# Patient Record
Sex: Male | Born: 1940 | Race: White | Hispanic: No | Marital: Married | Smoking: Former smoker
Health system: Southern US, Community
[De-identification: ages and names within clinical notes are randomized; demographics above are authoritative.]

## PROBLEM LIST (undated history)

## (undated) DIAGNOSIS — L309 Dermatitis, unspecified: Secondary | ICD-10-CM

## (undated) DIAGNOSIS — I4892 Unspecified atrial flutter: Principal | ICD-10-CM

## (undated) DIAGNOSIS — R9431 Abnormal electrocardiogram [ECG] [EKG]: Secondary | ICD-10-CM

## (undated) DIAGNOSIS — K219 Gastro-esophageal reflux disease without esophagitis: Secondary | ICD-10-CM

## (undated) DIAGNOSIS — I4891 Unspecified atrial fibrillation: Secondary | ICD-10-CM

## (undated) DIAGNOSIS — I428 Other cardiomyopathies: Secondary | ICD-10-CM

## (undated) DIAGNOSIS — I319 Disease of pericardium, unspecified: Secondary | ICD-10-CM

## (undated) DIAGNOSIS — B070 Plantar wart: Secondary | ICD-10-CM

## (undated) DIAGNOSIS — L989 Disorder of the skin and subcutaneous tissue, unspecified: Secondary | ICD-10-CM

## (undated) DIAGNOSIS — I10 Essential (primary) hypertension: Secondary | ICD-10-CM

## (undated) DIAGNOSIS — M1990 Unspecified osteoarthritis, unspecified site: Secondary | ICD-10-CM

## (undated) DIAGNOSIS — Z973 Presence of spectacles and contact lenses: Secondary | ICD-10-CM

## (undated) DIAGNOSIS — T7840XA Allergy, unspecified, initial encounter: Secondary | ICD-10-CM

## (undated) DIAGNOSIS — K5900 Constipation, unspecified: Secondary | ICD-10-CM

## (undated) HISTORY — DX: Constipation, unspecified: K59.00

## (undated) HISTORY — PX: COLONOSCOPY: SHX174

## (undated) HISTORY — DX: Dermatitis, unspecified: L30.9

## (undated) HISTORY — DX: Unspecified osteoarthritis, unspecified site: M19.90

## (undated) HISTORY — PX: COLONOSCOPY: SHX5424

## (undated) HISTORY — DX: Disorder of the skin and subcutaneous tissue, unspecified: L98.9

## (undated) HISTORY — DX: Allergy, unspecified, initial encounter: T78.40XA

## (undated) HISTORY — DX: Disease of pericardium, unspecified: I31.9

## (undated) HISTORY — DX: Plantar wart: B07.0

## (undated) HISTORY — DX: Gastro-esophageal reflux disease without esophagitis: K21.9

## (undated) HISTORY — PX: CALCANEUS HARDWARE REMOVAL: SHX1283

## (undated) HISTORY — DX: Essential (primary) hypertension: I10

## (undated) HISTORY — PX: TONSILLECTOMY: SUR1361

---

## 1991-09-24 HISTORY — PX: CALCANEAL OSTEOTOMY: SHX1281

## 1994-09-23 HISTORY — PX: FINGER AMPUTATION: SHX636

## 1999-04-02 ENCOUNTER — Emergency Department (HOSPITAL_COMMUNITY): Admission: EM | Admit: 1999-04-02 | Discharge: 1999-04-02 | Payer: Self-pay | Admitting: *Deleted

## 1999-04-05 ENCOUNTER — Encounter (HOSPITAL_COMMUNITY): Admission: RE | Admit: 1999-04-05 | Discharge: 1999-07-04 | Payer: Self-pay | Admitting: Emergency Medicine

## 2011-11-26 ENCOUNTER — Encounter: Payer: Self-pay | Admitting: Family Medicine

## 2011-11-26 ENCOUNTER — Ambulatory Visit (INDEPENDENT_AMBULATORY_CARE_PROVIDER_SITE_OTHER): Payer: Medicare Other | Admitting: Family Medicine

## 2011-11-26 DIAGNOSIS — Z0000 Encounter for general adult medical examination without abnormal findings: Secondary | ICD-10-CM

## 2011-11-26 DIAGNOSIS — Z136 Encounter for screening for cardiovascular disorders: Secondary | ICD-10-CM

## 2011-11-26 DIAGNOSIS — M25512 Pain in left shoulder: Secondary | ICD-10-CM

## 2011-11-26 DIAGNOSIS — M25519 Pain in unspecified shoulder: Secondary | ICD-10-CM

## 2011-11-26 DIAGNOSIS — M629 Disorder of muscle, unspecified: Secondary | ICD-10-CM

## 2011-11-26 DIAGNOSIS — IMO0001 Reserved for inherently not codable concepts without codable children: Secondary | ICD-10-CM | POA: Insufficient documentation

## 2011-11-26 DIAGNOSIS — Z125 Encounter for screening for malignant neoplasm of prostate: Secondary | ICD-10-CM

## 2011-11-26 DIAGNOSIS — R030 Elevated blood-pressure reading, without diagnosis of hypertension: Secondary | ICD-10-CM

## 2011-11-26 DIAGNOSIS — L989 Disorder of the skin and subcutaneous tissue, unspecified: Secondary | ICD-10-CM

## 2011-11-26 DIAGNOSIS — M19019 Primary osteoarthritis, unspecified shoulder: Secondary | ICD-10-CM

## 2011-11-26 LAB — HEPATIC FUNCTION PANEL
Albumin: 4.1 g/dL (ref 3.5–5.2)
Total Protein: 6.8 g/dL (ref 6.0–8.3)

## 2011-11-26 LAB — LIPID PANEL
Cholesterol: 185 mg/dL (ref 0–200)
HDL: 59.1 mg/dL (ref >39.00)
Triglycerides: 85 mg/dL (ref 0.0–149.0)
VLDL: 17 mg/dL (ref 0.0–40.0)

## 2011-11-26 LAB — RENAL FUNCTION PANEL
Albumin: 4.1 g/dL (ref 3.5–5.2)
BUN: 13 mg/dL (ref 6–23)
Chloride: 98 mEq/L (ref 96–112)
Creatinine, Ser: 0.8 mg/dL (ref 0.4–1.5)
GFR: 103.27 mL/min (ref >60.00)
Glucose, Bld: 88 mg/dL (ref 70–99)
Phosphorus: 2.7 mg/dL (ref 2.3–4.6)

## 2011-11-26 LAB — CBC
HCT: 47 % (ref 39.0–52.0)
MCHC: 33.6 g/dL (ref 30.0–36.0)
MCV: 90.3 fl (ref 78.0–100.0)
RDW: 13 % (ref 11.5–14.6)
WBC: 6.8 10*3/uL (ref 4.5–10.5)

## 2011-11-26 LAB — TSH: TSH: 1.34 u[IU]/mL (ref 0.35–5.50)

## 2011-11-26 NOTE — Progress Notes (Signed)
Patient ID: Francis Shaw, male   DOB: [birth year], 71 y.o.   MRN: [id number] Francis Shaw [id number] [birth year] 11/26/2011      Progress Note New Patient  Subjective  Chief Complaint  Chief Complaint  Patient presents with  . Establish Care    new patient    HPI  Patient is a 71 yo Caucasian male in for new patient appointment. Overall he is in good health and is not have to see a physician in a number of years. He only complains of shoulder pain. In the past he had right shoulder pain which was attributed to arthritis and after a steroid injection has not returned. He now has exactly the same pain in the left shoulder. Denies any injury or previous surgery. No recent illness, fevers, chills, chest pain, palpitations, shortness of breath, GI or GU complaints.  Past Medical History  Diagnosis Date  . Chicken pox as a child  . Measles as a child  . Mumps as a child  . Arthritis     shoulders  . 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 Skin lesion 11/26/2011  . Elevated BP 11/26/2011  . Preventative health care 11/26/2011    Past Surgical History  Procedure Date  . Right foot sugery 1993    shattered heel  . 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 Stroke Father   . Asthma Mother     low back pain    History   Social History  . Marital Status: Single    Spouse Name: N/A    Number of Children: N/A  . Years of Education: N/A   Occupational History  . Not on file.   Social History Main Topics  . Smoking status: Former Smoker -- 1.0 packs/day for 18 years    Types: Cigarettes    Quit date: 09/23/1977  . Smokeless tobacco: Never Used  . Alcohol Use: Yes     socially  . Drug Use: No  . Sexually Active: Yes   Other Topics Concern  . Not on file   Social History Narrative  . No narrative on file    No current outpatient prescriptions on file prior to visit.    No Known Allergies  Review of Systems  Review of  Systems  Constitutional: Negative for fever, chills and malaise/fatigue.  HENT: Negative for hearing loss, nosebleeds and congestion.   Eyes: Negative for discharge.  Respiratory: Negative for cough, sputum production, shortness of breath and wheezing.   Cardiovascular: Negative for chest pain, palpitations and leg swelling.  Gastrointestinal: Negative for heartburn, nausea, vomiting, abdominal pain, diarrhea, constipation and blood in stool.  Genitourinary: Negative for dysuria, urgency, frequency and hematuria.  Musculoskeletal: Positive for joint pain. Negative for myalgias, back pain and falls.       Left shoulder pain  Skin: Negative for rash.  Neurological: Negative for dizziness, tremors, sensory change, focal weakness, loss of consciousness, weakness and headaches.  Endo/Heme/Allergies: Negative for polydipsia. Does not bruise/bleed easily.  Psychiatric/Behavioral: Negative for depression and suicidal ideas. The patient is not nervous/anxious and does not have insomnia.     Objective  BP 149/86  Pulse 71  Temp(Src) 98.6 F (37 C) (Temporal)  Ht 5\' 11"  (1.803 m)  Wt 175 lb 1.9 oz (79.434 kg)  BMI 24.42 kg/m2  SpO2 97%  Physical Exam  Physical Exam  Constitutional: He is oriented to person, place, and time  and well-developed, well-nourished, and in no distress. No distress.  HENT:  Head: Normocephalic and atraumatic.  Eyes: Conjunctivae are normal.  Neck: Neck supple. No thyromegaly present.  Cardiovascular: Normal rate, regular rhythm and normal heart sounds.   No murmur heard. Pulmonary/Chest: Effort normal and breath sounds normal. No respiratory distress.  Abdominal: He exhibits no distension and no mass. There is no tenderness.  Musculoskeletal: He exhibits no edema.  Neurological: He is alert and oriented to person, place, and time.  Skin: Skin is warm. There is erythema.       Oval shaped lesion on neck behind left ear. Erythematous base and scaly, 1/2 x 1 cm     Psychiatric: Memory, affect and judgment normal.       Assessment & Plan  Skin lesion Scaly, pruritic lesion, erythematous base left side of neck, he already has an established relationship with Dr Yetta Barre of [facility], he agrees to call there and set up an appt he has not been in there in several years  Elevated BP Given handout on the DASH diet and encouraged to avoid sodium, maintain exercise. Reassess at next visit. Check fasting labs including a TSH  Arthritis of shoulder Previously had a flare in pain secondary to arthritis in right shoulder, he ultimately had a steroid injection with neurology and the pain has never returned. He is sent for xray of shoulder and referred back to neuro for further intervention. May continue Advil prn sparingly.  Preventative health care We will request old records from PMD and GI. Encouraged to accept the Tdap and the Zostavax at next visit. He will check with his insurance to make sure they cover zostavax.

## 2011-11-26 NOTE — Assessment & Plan Note (Signed)
Previously had a flare in pain secondary to arthritis in right shoulder, he ultimately had a steroid injection with neurology and the pain has never returned. He is sent for xray of shoulder and referred back to neuro for further intervention. May continue Advil prn sparingly.

## 2011-11-26 NOTE — Assessment & Plan Note (Signed)
We will request old records from PMD and GI. Encouraged to accept the Tdap and the Zostavax at next visit. He will check with his insurance to make sure they cover zostavax.

## 2011-11-26 NOTE — Patient Instructions (Signed)
Preventive Care for Adults, Male A healthy lifestyle and preventative care can promote health and wellness. Preventative health guidelines for men include the following key practices:  A routine yearly physical is a good way to check with your caregiver about your health and preventative screening. It is a chance to share any concerns and updates on your health, and to receive a thorough exam.   Visit your dentist for a routine exam and preventative care every 6 months. Brush your teeth twice a day and floss once a day. Good oral hygiene prevents tooth decay and gum disease.   The frequency of eye exams is based on your age, health, family medical history, use of contact lenses, and other factors. Follow your caregiver's recommendations for frequency of eye exams.   Eat a healthy diet. Foods like vegetables, fruits, whole grains, low-fat dairy products, and lean protein foods contain the nutrients you need without too many calories. Decrease your intake of foods high in solid fats, added sugars, and salt. Eat the right amount of calories for you.Get information about a proper diet from your caregiver, if necessary.   Regular physical exercise is one of the most important things you can do for your health. Most adults should get at least 150 minutes of moderate-intensity exercise (any activity that increases your heart rate and causes you to sweat) each week. In addition, most adults need muscle-strengthening exercises on 2 or more days a week.   Maintain a healthy weight. The body mass index (BMI) is a screening tool to identify possible weight problems. It provides an estimate of body fat based on height and weight. Your caregiver can help determine your BMI, and can help you achieve or maintain a healthy weight.For adults 20 years and older:   A BMI below 18.5 is considered underweight.   A BMI of 18.5 to 24.9 is normal.   A BMI of 25 to 29.9 is considered overweight.   A BMI of 30 and above  is considered obese.   Maintain normal blood lipids and cholesterol levels by exercising and minimizing your intake of saturated fat. Eat a balanced diet with plenty of fruit and vegetables. Blood tests for lipids and cholesterol should begin at age 20 and be repeated every 5 years. If your lipid or cholesterol levels are high, you are over 50, or you are a high risk for heart disease, you may need your cholesterol levels checked more frequently.Ongoing high lipid and cholesterol levels should be treated with medicines if diet and exercise are not effective.   If you smoke, find out from your caregiver how to quit. If you do not use tobacco, do not start.   If you choose to drink alcohol, do not exceed 2 drinks per day. One drink is considered to be 12 ounces (355 mL) of beer, 5 ounces (148 mL) of wine, or 1.5 ounces (44 mL) of liquor.   Avoid use of street drugs. Do not share needles with anyone. Ask for help if you need support or instructions about stopping the use of drugs.   High blood pressure causes heart disease and increases the risk of stroke. Your blood pressure should be checked at least every 1 to 2 years. Ongoing high blood pressure should be treated with medicines, if weight loss and exercise are not effective.   If you are 45 to 70 years old, ask your caregiver if you should take aspirin to prevent heart disease.   Diabetes screening involves taking a blood   sample to check your fasting blood sugar level. This should be done once every 3 years, after age 45, if you are within normal weight and without risk factors for diabetes. Testing should be considered at a younger age or be carried out more frequently if you are overweight and have at least 1 risk factor for diabetes.   Colorectal cancer can be detected and often prevented. Most routine colorectal cancer screening begins at the age of 50 and continues through age 75. However, your caregiver may recommend screening at an earlier  age if you have risk factors for colon cancer. On a yearly basis, your caregiver may provide home test kits to check for hidden blood in the stool. Use of a small camera at the end of a tube, to directly examine the colon (sigmoidoscopy or colonoscopy), can detect the earliest forms of colorectal cancer. Talk to your caregiver about this at age 50, when routine screening begins. Direct examination of the colon should be repeated every 5 to 10 years through age 75, unless early forms of pre-cancerous polyps or small growths are found.   Hepatitis C blood testing is recommended for all people born from 1945 through 1965 and any individual with known risks for hepatitis C.   Practice safe sex. Use condoms and avoid high-risk sexual practices to reduce the spread of sexually transmitted infections (STIs). STIs include gonorrhea, chlamydia, syphilis, trichomonas, herpes, HPV, and human immunodeficiency virus (HIV). Herpes, HIV, and HPV are viral illnesses that have no cure. They can result in disability, cancer, and death.   A one-time screening for abdominal aortic aneurysm (AAA) and surgical repair of large AAAs by sound wave imaging (ultrasonography) is recommended for ages 65 to 75 years who are current or former smokers.   Healthy men should no longer receive prostate-specific antigen (PSA) blood tests as part of routine cancer screening. Consult with your caregiver about prostate cancer screening.   Testicular cancer screening is not recommended for adult males who have no symptoms. Screening includes self-exam, caregiver exam, and other screening tests. Consult with your caregiver about any symptoms you have or any concerns you have about testicular cancer.   Use sunscreen with skin protection factor (SPF) of 30 or more. Apply sunscreen liberally and repeatedly throughout the day. You should seek shade when your shadow is shorter than you. Protect yourself by wearing long sleeves, pants, a  wide-brimmed hat, and sunglasses year round, whenever you are outdoors.   Once a month, do a whole body skin exam, using a mirror to look at the skin on your back. Notify your caregiver of new moles, moles that have irregular borders, moles that are larger than a pencil eraser, or moles that have changed in shape or color.   Stay current with required immunizations.   Influenza. You need a dose every fall (or winter). The composition of the flu vaccine changes each year, so being vaccinated once is not enough.   Pneumococcal polysaccharide. You need 1 to 2 doses if you smoke cigarettes or if you have certain chronic medical conditions. You need 1 dose at age 65 (or older) if you have never been vaccinated.   Tetanus, diphtheria, pertussis (Tdap, Td). Get 1 dose of Tdap vaccine if you are younger than age 65 years, are over 65 and have contact with an infant, are a healthcare worker, or simply want to be protected from whooping cough. After that, you need a Td booster dose every 10 years. Consult your caregiver if   you have not had at least 3 tetanus and diphtheria-containing shots sometime in your life or have a deep or dirty wound.   HPV. This vaccine is recommended for males 13 through 70 years of age. This vaccine may be given to men 22 through 70 years of age who have not completed the 3 dose series. It is recommended for men through age 26 who have sex with men or whose immune system is weakened because of HIV infection, other illness, or medications. The vaccine is given in 3 doses over 6 months.   Measles, mumps, rubella (MMR). You need at least 1 dose of MMR if you were born in 1957 or later. You may also need a 2nd dose.   Meningococcal. If you are age 19 to 21 years and a first-year college student living in a residence hall, or have one of several medical conditions, you need to get vaccinated against meningococcal disease. You may also need additional booster doses.   Zoster (shingles).  If you are age 60 years or older, you should get this vaccine.   Varicella (chickenpox). If you have never had chickenpox or you were vaccinated but received only 1 dose, talk to your caregiver to find out if you need this vaccine.   Hepatitis A. You need this vaccine if you have a specific risk factor for hepatitis A virus infection, or you simply wish to be protected from this disease. The vaccine is usually given as 2 doses, 6 to 18 months apart.   Hepatitis B. You need this vaccine if you have a specific risk factor for hepatitis B virus infection or you simply wish to be protected from this disease. The vaccine is given in 3 doses, usually over 6 months.  Preventative Service / Frequency Ages 19 to 39  Blood pressure check.** / Every 1 to 2 years.   Lipid and cholesterol check.** / Every 5 years beginning at age 20.   Hepatitis C blood test.** / For any individual with known risks for hepatitis C.   Skin self-exam. / Monthly.   Influenza immunization.** / Every year.   Pneumococcal polysaccharide immunization.** / 1 to 2 doses if you smoke cigarettes or if you have certain chronic medical conditions.   Tetanus, diphtheria, pertussis (Tdap,Td) immunization. / A one-time dose of Tdap vaccine. After that, you need a Td booster dose every 10 years.   HPV immunization. / 3 doses over 6 months, if 26 and younger.   Measles, mumps, rubella (MMR) immunization. / You need at least 1 dose of MMR if you were born in 1957 or later. You may also need a 2nd dose.   Meningococcal immunization. / 1 dose if you are age 19 to 21 years and a first-year college student living in a residence hall, or have one of several medical conditions, you need to get vaccinated against meningococcal disease. You may also need additional booster doses.   Varicella immunization.** / Consult your caregiver.   Hepatitis A immunization.** / Consult your caregiver. 2 doses, 6 to 18 months apart.   Hepatitis B  immunization.** / Consult your caregiver. 3 doses usually over 6 months.  Ages 40 to 64  Blood pressure check.** / Every 1 to 2 years.   Lipid and cholesterol check.** / Every 5 years beginning at age 20.   Fecal occult blood test (FOBT) of stool. / Every year beginning at age 50 and continuing until age 75. You may not have to do this test if   you get colonoscopy every 10 years.   Flexible sigmoidoscopy** or colonoscopy.** / Every 5 years for a flexible sigmoidoscopy or every 10 years for a colonoscopy beginning at age 50 and continuing until age 75.   Hepatitis C blood test.** / For all people born from 1945 through 1965 and any individual with known risks for hepatitis C.   Skin self-exam. / Monthly.   Influenza immunization.** / Every year.   Pneumococcal polysaccharide immunization.** / 1 to 2 doses if you smoke cigarettes or if you have certain chronic medical conditions.   Tetanus, diphtheria, pertussis (Tdap/Td) immunization.** / A one-time dose of Tdap vaccine. After that, you need a Td booster dose every 10 years.   Measles, mumps, rubella (MMR) immunization. / You need at least 1 dose of MMR if you were born in 1957 or later. You may also need a 2nd dose.   Varicella immunization.**/ Consult your caregiver.   Meningococcal immunization.** / Consult your caregiver.   Hepatitis A immunization.** / Consult your caregiver. 2 doses, 6 to 18 months apart.   Hepatitis B immunization.** / Consult your caregiver. 3 doses, usually over 6 months.  Ages 65 and over  Blood pressure check.** / Every 1 to 2 years.   Lipid and cholesterol check.**/ Every 5 years beginning at age 20.   Fecal occult blood test (FOBT) of stool. / Every year beginning at age 50 and continuing until age 75. You may not have to do this test if you get colonoscopy every 10 years.   Flexible sigmoidoscopy** or colonoscopy.** / Every 5 years for a flexible sigmoidoscopy or every 10 years for a colonoscopy  beginning at age 50 and continuing until age 75.   Hepatitis C blood test.** / For all people born from 1945 through 1965 and any individual with known risks for hepatitis C.   Abdominal aortic aneurysm (AAA) screening.** / A one-time screening for ages 65 to 75 years who are current or former smokers.   Skin self-exam. / Monthly.   Influenza immunization.** / Every year.   Pneumococcal polysaccharide immunization.** / 1 dose at age 65 (or older) if you have never been vaccinated.   Tetanus, diphtheria, pertussis (Tdap, Td) immunization. / A one-time dose of Tdap vaccine if you are over 65 and have contact with an infant, are a healthcare worker, or simply want to be protected from whooping cough. After that, you need a Td booster dose every 10 years.   Varicella immunization. ** / Consult your caregiver.   Meningococcal immunization.** / Consult your caregiver.   Hepatitis A immunization. ** / Consult your caregiver. 2 doses, 6 to 18 months apart.   Hepatitis B immunization.** / Check with your caregiver. 3 doses, usually over 6 months.  **Family history and personal history of risk and conditions may change your caregiver's recommendations. Document Released: 11/05/2001 Document Revised: 08/29/2011 Document Reviewed: 02/04/2011 [employer] Patient Information 2012 [employer]. 

## 2011-11-26 NOTE — Assessment & Plan Note (Signed)
Given handout on the DASH diet and encouraged to avoid sodium, maintain exercise. Reassess at next visit. Check fasting labs including a TSH

## 2011-11-26 NOTE — Assessment & Plan Note (Signed)
Scaly, pruritic lesion, erythematous base left side of neck, he already has an established relationship with Dr Yetta Barre [facility], he agrees to call there and set up an appt he has not been in there in several years

## 2011-11-27 ENCOUNTER — Ambulatory Visit
Admission: RE | Admit: 2011-11-27 | Discharge: 2011-11-27 | Disposition: A | Discharge status: Home / Self Care | Payer: Medicare Other | Source: Ambulatory Visit | Attending: Family Medicine | Admitting: Family Medicine

## 2011-11-27 DIAGNOSIS — M25512 Pain in left shoulder: Secondary | ICD-10-CM

## 2012-02-26 ENCOUNTER — Ambulatory Visit: Payer: Medicare Other | Admitting: Family Medicine

## 2012-03-05 ENCOUNTER — Ambulatory Visit (INDEPENDENT_AMBULATORY_CARE_PROVIDER_SITE_OTHER): Payer: Medicare Other | Admitting: Family Medicine

## 2012-03-05 ENCOUNTER — Encounter: Payer: Self-pay | Admitting: Family Medicine

## 2012-03-05 VITALS — BP 136/82 | HR 66 | Temp 98.0°F | Ht 71.0 in | Wt 168.1 lb

## 2012-03-05 DIAGNOSIS — IMO0001 Reserved for inherently not codable concepts without codable children: Secondary | ICD-10-CM

## 2012-03-05 DIAGNOSIS — R030 Elevated blood-pressure reading, without diagnosis of hypertension: Secondary | ICD-10-CM

## 2012-03-05 DIAGNOSIS — E785 Hyperlipidemia, unspecified: Secondary | ICD-10-CM | POA: Insufficient documentation

## 2012-03-05 DIAGNOSIS — Z23 Encounter for immunization: Secondary | ICD-10-CM

## 2012-03-05 NOTE — Patient Instructions (Addendum)

## 2012-03-05 NOTE — Assessment & Plan Note (Signed)
Improved on repeat check, avoid sodium and we will recheck at next visit.

## 2012-03-05 NOTE — Assessment & Plan Note (Signed)
Avoid trans fats, continue MegaRed caps daily, increase exercise, recheck in 1 year.

## 2012-03-05 NOTE — Progress Notes (Signed)
Patient ID: Francis Shaw, male   DOB: [birth year], 71 y.o.   MRN: [id number] Francis Shaw [id number] January 07, 1941 03/05/2012      Progress Note-Follow Up  Subjective  Chief Complaint  Chief Complaint  Patient presents with  . Follow-up    3 month    HPI  Patient is in today for followup appointment. Patient is a caucasian male and feels well today. Her recent illness, fevers, chills, chest pain, palpitations, shortness of breath. 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 No illness in no acute complaints noted today appear  Past Medical History  Diagnosis Date  . Chicken pox as a child  . Measles as a child  . Mumps as a child  . Arthritis     shoulders  . 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 Skin lesion 11/26/2011  . Elevated BP 11/26/2011  . Preventative health care 11/26/2011  . Hyperlipidemia 03/05/2012    Past Surgical History  Procedure Date  . Right foot sugery 1993    shattered heel  . 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 Stroke Father   . Asthma Mother     low back pain    History   Social History  . Marital Status: Single    Spouse Name: N/A    Number of Children: N/A  . Years of Education: N/A   Occupational History  . Not on file.   Social History Main Topics  . Smoking status: Former Smoker -- 1.0 packs/day for 18 years    Types: Cigarettes    Quit date: 09/23/1977  . Smokeless tobacco: Never Used  . Alcohol Use: Yes     socially  . Drug Use: No  . Sexually Active: Yes   Other Topics Concern  . Not on file   Social History Narrative  . No narrative on file    Current Outpatient Prescriptions on File Prior to Visit  Medication Sig Dispense Refill  . Multiple Vitamins-Minerals (CENTRUM SILVER PO) Take 1 tablet by mouth daily.      . NON FORMULARY megared daily         No Known Allergies  Review of Systems  Review of Systems  Constitutional: Negative for fever and malaise/fatigue.  HENT: Negative for congestion.   Eyes: Negative for discharge.  Respiratory: Negative for shortness of breath.   Cardiovascular: Negative for chest pain, palpitations and leg swelling.  Gastrointestinal: Negative for nausea, abdominal pain and diarrhea.  Genitourinary: Negative for dysuria.  Musculoskeletal: Negative for falls.  Skin: Negative for rash.  Neurological: Negative for loss of consciousness and headaches.  Endo/Heme/Allergies: Negative for polydipsia.  Psychiatric/Behavioral: Negative for depression and suicidal ideas. The patient is not nervous/anxious and does not have insomnia.     Objective  BP 136/82  Pulse 66  Temp 98 F (36.7 C) (Temporal)  Ht 5\' 11"  (1.803 m)  Wt 168 lb 1.9 oz (76.259 kg)  BMI 23.45 kg/m2  SpO2 98%  Physical Exam  Physical Exam  Constitutional: He is oriented to person, place, and time and well-developed, well-nourished, and in no distress. No distress.  HENT:  Head: Normocephalic and atraumatic.  Eyes: Conjunctivae are normal.  Neck: Neck supple. No thyromegaly present.  Cardiovascular: Normal rate, regular rhythm and normal heart sounds.  No murmur heard. Pulmonary/Chest: Effort normal and breath sounds normal. No respiratory distress.  Abdominal: He exhibits no distension and no mass. There is no tenderness.  Musculoskeletal: He exhibits no edema.  Neurological: He is alert and oriented to person, place, and time.  Skin: Skin is warm.  Psychiatric: Memory, affect and judgment normal.    Lab Results  Component Value Date   TSH 1.34 11/26/2011   Lab Results  Component Value Date   WBC 6.8 11/26/2011   HGB 15.8 11/26/2011   HCT 47.0 11/26/2011   MCV 90.3 11/26/2011   PLT 197.0 11/26/2011   Lab Results  Component Value Date   CREATININE 0.8 11/26/2011   BUN 13 11/26/2011   NA 139 11/26/2011   K 4.8 11/26/2011   CL  98 11/26/2011   CO2 28 11/26/2011   Lab Results  Component Value Date   ALT 23 11/26/2011   AST 22 11/26/2011   ALKPHOS 78 11/26/2011   BILITOT 0.5 11/26/2011   Lab Results  Component Value Date   CHOL 185 11/26/2011   Lab Results  Component Value Date   HDL 59.10 11/26/2011   Lab Results  Component Value Date   LDLCALC 109* 11/26/2011   Lab Results  Component Value Date   TRIG 85.0 11/26/2011   Lab Results  Component Value Date   CHOLHDL 3 11/26/2011     Assessment & Plan  Hyperlipidemia Avoid trans fats, continue MegaRed caps daily, increase exercise, recheck in 1 year.  Elevated BP Improved on repeat check, avoid sodium and we will recheck at next visit.

## 2012-11-19 ENCOUNTER — Ambulatory Visit (INDEPENDENT_AMBULATORY_CARE_PROVIDER_SITE_OTHER): Payer: Medicare Other | Admitting: Family Medicine

## 2012-11-19 ENCOUNTER — Encounter: Payer: Self-pay | Admitting: Family Medicine

## 2012-11-19 ENCOUNTER — Ambulatory Visit: Payer: Medicare Other | Admitting: Family Medicine

## 2012-11-19 VITALS — BP 144/90 | HR 110 | Temp 97.9°F | Ht 71.0 in | Wt 179.0 lb

## 2012-11-19 DIAGNOSIS — IMO0001 Reserved for inherently not codable concepts without codable children: Secondary | ICD-10-CM

## 2012-11-19 DIAGNOSIS — L259 Unspecified contact dermatitis, unspecified cause: Secondary | ICD-10-CM

## 2012-11-19 DIAGNOSIS — R197 Diarrhea, unspecified: Secondary | ICD-10-CM

## 2012-11-19 DIAGNOSIS — L309 Dermatitis, unspecified: Secondary | ICD-10-CM

## 2012-11-19 DIAGNOSIS — R030 Elevated blood-pressure reading, without diagnosis of hypertension: Secondary | ICD-10-CM

## 2012-11-19 DIAGNOSIS — Z0000 Encounter for general adult medical examination without abnormal findings: Secondary | ICD-10-CM

## 2012-11-19 MED ORDER — LORATADINE 10 MG PO TABS: 10.0000 mg | ORAL_TABLET | Freq: Every day | ORAL | Status: DC | PRN

## 2012-11-19 MED ORDER — CLOBETASOL PROPIONATE 0.05 % EX CREA: TOPICAL_CREAM | Freq: Two times a day (BID) | CUTANEOUS | Status: DC | PRN

## 2012-11-19 MED ORDER — HYDROXYZINE HCL 10 MG PO TABS: 10.0000 mg | ORAL_TABLET | Freq: Every evening | ORAL | Status: DC | PRN

## 2012-11-19 NOTE — Patient Instructions (Addendum)
Probiotics such as Digestive Advantage by [employer]  Eczema Atopic dermatitis, or eczema, is an inherited type of sensitive skin. Often people with eczema have a family history of allergies, asthma, or hay fever. It causes a red itchy rash and dry scaly skin. The itchiness may occur before the skin rash and may be very intense. It is not contagious. Eczema is generally worse during the cooler winter months and often improves with the warmth of summer. Eczema usually starts showing signs in infancy. Some children outgrow eczema, but it may last through adulthood. Flare-ups may be caused by:  Eating something or contact with something you are sensitive or allergic to.  Stress. DIAGNOSIS  The diagnosis of eczema is usually based upon symptoms and medical history. TREATMENT  Eczema cannot be cured, but symptoms usually can be controlled with treatment or avoidance of allergens (things to which you are sensitive or allergic to).  Controlling the itching and scratching.  Use over-the-counter antihistamines as directed for itching. It is especially useful at night when the itching tends to be worse.  Use over-the-counter steroid creams as directed for itching.  Scratching makes the rash and itching worse and may cause impetigo (a skin infection) if fingernails are contaminated (dirty).  Keeping the skin well moisturized with creams every day. This will seal in moisture and help prevent dryness. Lotions containing alcohol and water can dry the skin and are not recommended.  Limiting exposure to allergens.  Recognizing situations that cause stress.  Developing a plan to manage stress. HOME CARE INSTRUCTIONS   Take prescription and over-the-counter medicines as directed by your caregiver.  Do not use anything on the skin without checking with your caregiver.  Keep baths or showers short (5 minutes) in warm (not hot) water. Use mild cleansers for bathing. You may add non-perfumed bath oil to the  bath water. It is best to avoid soap and bubble bath.  Immediately after a bath or shower, when the skin is still damp, apply a moisturizing ointment to the entire body. This ointment should be a petroleum ointment. This will seal in moisture and help prevent dryness. The thicker the ointment the better. These should be unscented.  Keep fingernails cut short and wash hands often. If your child has eczema, it may be necessary to put soft gloves or mittens on your child at night.  Dress in clothes made of cotton or cotton blends. Dress lightly, as heat increases itching.  Avoid foods that may cause flare-ups. Common foods include cow's milk, peanut butter, eggs and wheat.  Keep a child with eczema away from anyone with fever blisters. The virus that causes fever blisters (herpes simplex) can cause a serious skin infection in children with eczema. SEEK MEDICAL CARE IF:   Itching interferes with sleep.  The rash gets worse or is not better within one week following treatment.  The rash looks infected (pus or soft yellow scabs).  You or your child has an oral temperature above 102 F (38.9 C).  Your baby is older than 3 months with a rectal temperature of 100.5 F (38.1 C) or higher for more than 1 day.  The rash flares up after contact with someone who has fever blisters. SEEK IMMEDIATE MEDICAL CARE IF:   Your baby is older than 3 months with a rectal temperature of 102 F (38.9 C) or higher.  Your baby is older than 3 months or younger with a rectal temperature of 100.4 F (38 C) or higher. Document Released:  09/06/2000 Document Revised: 12/02/2011 Document Reviewed: 07/12/2009 [facility] Patient Information 2013 [place], [place].

## 2012-11-22 ENCOUNTER — Encounter: Payer: Self-pay | Admitting: Family Medicine

## 2012-11-22 DIAGNOSIS — R197 Diarrhea, unspecified: Secondary | ICD-10-CM | POA: Insufficient documentation

## 2012-11-22 DIAGNOSIS — L309 Dermatitis, unspecified: Secondary | ICD-10-CM | POA: Insufficient documentation

## 2012-11-22 NOTE — Assessment & Plan Note (Signed)
Improved on recheck, will continue to monitor 

## 2012-11-22 NOTE — Assessment & Plan Note (Signed)
Hands and anal area. Encouraged increased hydration, minimal soaps, given steroid cream to apply sparingly. Report if no resolution

## 2012-11-22 NOTE — Assessment & Plan Note (Signed)
Mild, enocuraged probiotics, fiber nad report if no improvement.

## 2012-11-22 NOTE — Progress Notes (Signed)
Patient ID: Francis Shaw, male   DOB: [birth year], 72 y.o.   MRN: [id number] Francis Shaw [id number] Sep 11, 1941 11/22/2012      Progress Note-Follow Up  Subjective  Chief Complaint  Chief Complaint  Patient presents with  . Bloated    gas, and a little bit of diarrhea X 6 weeks (after eating)  . itching    back of head, right side, anal itch X 2 weeks    HPI  Patient is a 72 year old Caucasian male in today for evaluation of abdominal discomfort and rash. Is complaining of roughly 6 weeks with a abdominal bloating and intermittent mild episodes of diarrhea. No bloody or tarry stool. Has occasional mild nausea but no vomiting. No constipation, fevers or chills. He reports the loose stool and bloating are worse after eating does not seem to correlate with certain foods. No chest pain or palpitations no shortness of breath GU complaints. Also complaining of an itchy scaly rash on his hands and anal region. Has had trouble off and on with the rash is throughout his life but these are worse than usual. He also has a rash on his right flank. He denies any recent change or products. Has gotten some minimal relief with Benadryl. The itching is severe enough to keep him awake at night quite frequently  Past Medical History  Diagnosis Date  . Chicken pox as a child  . Measles as a child  . Mumps as a child  . Arthritis     shoulders  . 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 Skin lesion 11/26/2011  . Elevated BP 11/26/2011  . Preventative health care 11/26/2011  . Hyperlipidemia 03/05/2012  . Dermatitis 11/22/2012  . Diarrhea 11/22/2012    Past Surgical History  Procedure Laterality Date  . Right foot sugery  1993    shattered heel  . 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 Stroke Father   . Asthma Mother     low back pain    History   Social History  . Marital Status: Single    Spouse Name: N/A    Number of Children: N/A  . Years  of Education: N/A   Occupational History  . Not on file.   Social History Main Topics  . Smoking status: Former Smoker -- 1.00 packs/day for 18 years    Types: Cigarettes    Quit date: 09/23/1977  . Smokeless tobacco: Never Used  . Alcohol Use: Yes     Comment: socially  . Drug Use: No  . Sexually Active: Yes   Other Topics Concern  . Not on file   Social History Narrative  . No narrative on file    Current Outpatient Prescriptions on File Prior to Visit  Medication Sig Dispense Refill  . Multiple Vitamins-Minerals (CENTRUM SILVER PO) Take 1 tablet by mouth daily.      . NON FORMULARY megared daily       No current facility-administered medications on file prior to visit.    No Known Allergies  Review of Systems  Review of Systems  Constitutional: Negative for fever and malaise/fatigue.  HENT: Negative for congestion.   Eyes: Negative for discharge.  Respiratory: Negative for shortness of breath.   Cardiovascular: Negative for chest pain, palpitations and leg swelling.  Gastrointestinal: Positive for nausea, abdominal pain and diarrhea. Negative for constipation, blood in stool and melena.  Genitourinary: Negative for  dysuria.  Musculoskeletal: Negative for falls.  Skin: Positive for itching and rash.  Neurological: Negative for loss of consciousness and headaches.  Endo/Heme/Allergies: Negative for polydipsia.  Psychiatric/Behavioral: Negative for depression and suicidal ideas. The patient is not nervous/anxious and does not have insomnia.     Objective  BP 144/90  Pulse 110  Temp(Src) 97.9 F (36.6 C) (Oral)  Ht 5\' 11"  (1.803 m)  Wt 179 lb 0.6 oz (81.212 kg)  BMI 24.98 kg/m2  SpO2 96%  Physical Exam  Physical Exam  Constitutional: He is oriented to person, place, and time and well-developed, well-nourished, and in no distress. No distress.  HENT:  Head: Normocephalic and atraumatic.  Eyes: Conjunctivae are normal.  Neck: Neck supple. No thyromegaly  present.  Cardiovascular: Normal rate, regular rhythm and normal heart sounds.   No murmur heard. Pulmonary/Chest: Effort normal and breath sounds normal. No respiratory distress.  Abdominal: He exhibits no distension and no mass. There is no tenderness.  Musculoskeletal: He exhibits no edema.  Neurological: He is alert and oriented to person, place, and time.  Skin: Skin is warm. Rash noted. There is erythema.  Scaly, raised, erythematous patch on right hand  Psychiatric: Memory, affect and judgment normal.    Lab Results  Component Value Date   TSH 1.34 11/26/2011   Lab Results  Component Value Date   WBC 6.8 11/26/2011   HGB 15.8 11/26/2011   HCT 47.0 11/26/2011   MCV 90.3 11/26/2011   PLT 197.0 11/26/2011   Lab Results  Component Value Date   CREATININE 0.8 11/26/2011   BUN 13 11/26/2011   NA 139 11/26/2011   K 4.8 11/26/2011   CL 98 11/26/2011   CO2 28 11/26/2011   Lab Results  Component Value Date   ALT 23 11/26/2011   AST 22 11/26/2011   ALKPHOS 78 11/26/2011   BILITOT 0.5 11/26/2011   Lab Results  Component Value Date   CHOL 185 11/26/2011   Lab Results  Component Value Date   HDL 59.10 11/26/2011   Lab Results  Component Value Date   LDLCALC 109* 11/26/2011   Lab Results  Component Value Date   TRIG 85.0 11/26/2011   Lab Results  Component Value Date   CHOLHDL 3 11/26/2011     Assessment & Plan  Elevated BP Improved on recheck, will continue to monitor  Dermatitis Hands and anal area. Encouraged increased hydration, minimal soaps, given steroid cream to apply sparingly. Report if no resolution  Diarrhea Mild, enocuraged probiotics, fiber nad report if no improvement.

## 2012-11-24 MED ORDER — ZOSTER VACCINE LIVE 19400 UNT/0.65ML ~~LOC~~ SOLR: 0.6500 mL | Freq: Once | SUBCUTANEOUS | Status: DC

## 2013-03-02 ENCOUNTER — Ambulatory Visit (HOSPITAL_BASED_OUTPATIENT_CLINIC_OR_DEPARTMENT_OTHER)
Admission: RE | Admit: 2013-03-02 | Discharge: 2013-03-02 | Disposition: A | Discharge status: Home / Self Care | Payer: Medicare Other | Source: Ambulatory Visit | Attending: Family Medicine | Admitting: Family Medicine

## 2013-03-02 ENCOUNTER — Encounter: Payer: Self-pay | Admitting: Family Medicine

## 2013-03-02 ENCOUNTER — Ambulatory Visit (INDEPENDENT_AMBULATORY_CARE_PROVIDER_SITE_OTHER): Payer: Medicare Other | Admitting: Family Medicine

## 2013-03-02 VITALS — BP 112/68 | HR 78 | Temp 98.0°F | Ht 71.0 in | Wt 173.0 lb

## 2013-03-02 DIAGNOSIS — M25559 Pain in unspecified hip: Secondary | ICD-10-CM

## 2013-03-02 DIAGNOSIS — IMO0001 Reserved for inherently not codable concepts without codable children: Secondary | ICD-10-CM

## 2013-03-02 DIAGNOSIS — L309 Dermatitis, unspecified: Secondary | ICD-10-CM

## 2013-03-02 DIAGNOSIS — L259 Unspecified contact dermatitis, unspecified cause: Secondary | ICD-10-CM

## 2013-03-02 DIAGNOSIS — R079 Chest pain, unspecified: Secondary | ICD-10-CM | POA: Insufficient documentation

## 2013-03-02 DIAGNOSIS — M940 Chondrocostal junction syndrome [Tietze]: Secondary | ICD-10-CM

## 2013-03-02 DIAGNOSIS — R1011 Right upper quadrant pain: Secondary | ICD-10-CM

## 2013-03-02 DIAGNOSIS — M25551 Pain in right hip: Secondary | ICD-10-CM

## 2013-03-02 DIAGNOSIS — R030 Elevated blood-pressure reading, without diagnosis of hypertension: Secondary | ICD-10-CM

## 2013-03-02 DIAGNOSIS — Z23 Encounter for immunization: Secondary | ICD-10-CM

## 2013-03-02 MED ORDER — CLOBETASOL PROPIONATE 0.05 % EX CREA: TOPICAL_CREAM | Freq: Two times a day (BID) | CUTANEOUS | Status: DC | PRN

## 2013-03-02 MED ORDER — BACLOFEN 10 MG PO TABS: 10.0000 mg | ORAL_TABLET | Freq: Every evening | ORAL | Status: DC | PRN

## 2013-03-02 MED ORDER — ZOSTER VACCINE LIVE 19400 UNT/0.65ML ~~LOC~~ SOLR: 0.6500 mL | Freq: Once | SUBCUTANEOUS | Status: DC

## 2013-03-02 NOTE — Patient Instructions (Signed)
Salon Pas patches and or cream  Costochondritis Costochondritis (Tietze syndrome), or costochondral separation, is a swelling and irritation (inflammation) of the tissue (cartilage) that connects your ribs with your breastbone (sternum). It may occur on its own (spontaneously), through damage caused by an accident (trauma), or simply from coughing or minor exercise. It may take up to 6 weeks to get better and longer if you are unable to be conservative in your activities. HOME CARE INSTRUCTIONS   Avoid exhausting physical activity. Try not to strain your ribs during normal activity. This would include any activities using chest, belly (abdominal), and side muscles, especially if heavy weights are used.  Use ice for 15-20 minutes per hour while awake for the first 2 days. Place the ice in a plastic bag, and place a towel between the bag of ice and your skin.  Only take over-the-counter or prescription medicines for pain, discomfort, or fever as directed by your caregiver. SEEK IMMEDIATE MEDICAL CARE IF:   Your pain increases or you are very uncomfortable.  You have a fever.  You develop difficulty with your breathing.  You cough up blood.  You develop worse chest pains, shortness of breath, sweating, or vomiting.  You develop new, unexplained problems (symptoms). MAKE SURE YOU:   Understand these instructions.  Will watch your condition.  Will get help right away if you are not doing well or get worse. Document Released: 06/19/2005 Document Revised: 12/02/2011 Document Reviewed: 04/27/2008 [facility] Patient Information 2014 [place], [place].

## 2013-03-03 LAB — HEPATIC FUNCTION PANEL
ALT: 19 U/L (ref 0–53)
Alkaline Phosphatase: 83 U/L (ref 39–117)
Indirect Bilirubin: 0.5 mg/dL (ref 0.0–0.9)
Total Protein: 6.7 g/dL (ref 6.0–8.3)

## 2013-03-03 LAB — CBC
HCT: 45.5 % (ref 39.0–52.0)
MCV: 86.2 fL (ref 78.0–100.0)
RBC: 5.28 MIL/uL (ref 4.22–5.81)
WBC: 6.1 10*3/uL (ref 4.0–10.5)

## 2013-03-03 NOTE — Progress Notes (Signed)
Quick Note:  Patient Informed and voiced understanding ______ 

## 2013-03-04 ENCOUNTER — Encounter: Payer: Self-pay | Admitting: Family Medicine

## 2013-03-04 DIAGNOSIS — M25551 Pain in right hip: Secondary | ICD-10-CM | POA: Insufficient documentation

## 2013-03-04 NOTE — Assessment & Plan Note (Signed)
Well-controlled on current meds 

## 2013-03-04 NOTE — Assessment & Plan Note (Addendum)
Encouraged [employer], given Baclofen to use qhs prn, encouraged moist heat and gentle stretch

## 2013-03-04 NOTE — Progress Notes (Signed)
Patient ID: Francis Shaw, male   DOB: 01-30-1941, 72 y.o.   MRN: [id number] Francis Shaw [id number] [birth year] 03/04/2013      Progress Note-Follow Up  Subjective  Chief Complaint  Chief Complaint  Patient presents with  . Abdominal Pain    right sided abd pain around waist- X 3 weeks- on a scale of 7 on pain, hurts to push and move in certain directions- doesn't hurt to take a deep breath    HPI  Patient is a 72 year old Caucasian male who is in today complaining of some right hip pain. He describes the pain as being anterior almost in the right upper quadrant and radiating around to the back of his hip and sometimes down his leg. He notes improvement with position change. He's had no fevers or chills. He has had some fatigue and mild congestion. No injuries or falls but he does know the pain occurred shortly after he did very 70 pounds her palpation and twisting as well as deep breathing the pain is righ upper quadrant is worse. No GI or GU c/o.  Past Medical History  Diagnosis Date  . Chicken pox as a child  . Measles as a child  . Mumps as a child  . Arthritis     shoulders  . 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 Skin lesion 11/26/2011  . Elevated BP 11/26/2011  . Preventative health care 11/26/2011  . Hyperlipidemia 03/05/2012  . Dermatitis 11/22/2012  . Diarrhea 11/22/2012  . Right hip pain 03/04/2013    Past Surgical History  Procedure Laterality Date  . Right foot sugery  1993    shattered heel  . 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 Stroke Father   . Asthma Mother     low back pain    History   Social History  . Marital Status: Single    Spouse Name: N/A    Number of Children: N/A  . Years of Education: N/A   Occupational History  . Not on file.   Social History Main Topics  . Smoking status: Former Smoker -- 1.00 packs/day for 18 years    Types: Cigarettes    Quit date: 09/23/1977  . Smokeless  tobacco: Never Used  . Alcohol Use: Yes     Comment: socially  . Drug Use: No  . Sexually Active: Yes   Other Topics Concern  . Not on file   Social History Narrative  . No narrative on file    Current Outpatient Prescriptions on File Prior to Visit  Medication Sig Dispense Refill  . hydrOXYzine (ATARAX/VISTARIL) 10 MG tablet Take 1 tablet (10 mg total) by mouth at bedtime as needed for itching.  30 tablet  0  . loratadine (CLARITIN) 10 MG tablet Take 1 tablet (10 mg total) by mouth daily as needed for allergies.  30 tablet  5  . Multiple Vitamins-Minerals (CENTRUM SILVER PO) Take 1 tablet by mouth daily.      . NON FORMULARY megared daily       No current facility-administered medications on file prior to visit.    No Known Allergies  Review of Systems  Review of Systems  Constitutional: Negative for fever and malaise/fatigue.  HENT: Negative for congestion.   Eyes: Negative for discharge.  Respiratory: Negative for shortness of breath.   Cardiovascular: Negative for chest pain, palpitations and leg swelling.  Gastrointestinal: Negative  for nausea, abdominal pain and diarrhea.  Genitourinary: Negative for dysuria.  Musculoskeletal: Positive for joint pain. Negative for falls.  Skin: Negative for rash.  Neurological: Negative for loss of consciousness and headaches.  Endo/Heme/Allergies: Negative for polydipsia.  Psychiatric/Behavioral: Negative for depression and suicidal ideas. The patient is not nervous/anxious and does not have insomnia.     Objective  BP 112/68  Pulse 78  Temp(Src) 98 F (36.7 C) (Oral)  Ht 5\' 11"  (1.803 m)  Wt 173 lb 0.6 oz (78.49 kg)  BMI 24.14 kg/m2  SpO2 95%  Physical Exam  Physical Exam  Constitutional: He is oriented to person, place, and time and well-developed, well-nourished, and in no distress. No distress.  HENT:  Head: Normocephalic and atraumatic.  Eyes: Conjunctivae are normal.  Neck: Neck supple. No thyromegaly present.   Cardiovascular: Normal rate, regular rhythm and normal heart sounds.   No murmur heard. Pulmonary/Chest: Effort normal and breath sounds normal. No respiratory distress.  Abdominal: He exhibits no distension and no mass. There is no tenderness.  Musculoskeletal: Normal range of motion. He exhibits no edema and no tenderness.  Neurological: He is alert and oriented to person, place, and time.  Skin: Skin is warm.  Psychiatric: Memory, affect and judgment normal.    Lab Results  Component Value Date   TSH 1.34 11/26/2011   Lab Results  Component Value Date   WBC 6.1 03/02/2013   HGB 16.0 03/02/2013   HCT 45.5 03/02/2013   MCV 86.2 03/02/2013   PLT 185 03/02/2013   Lab Results  Component Value Date   CREATININE 0.8 11/26/2011   BUN 13 11/26/2011   NA 139 11/26/2011   K 4.8 11/26/2011   CL 98 11/26/2011   CO2 28 11/26/2011   Lab Results  Component Value Date   ALT 19 03/02/2013   AST 21 03/02/2013   ALKPHOS 83 03/02/2013   BILITOT 0.6 03/02/2013   Lab Results  Component Value Date   CHOL 185 11/26/2011   Lab Results  Component Value Date   HDL 59.10 11/26/2011   Lab Results  Component Value Date   LDLCALC 109* 11/26/2011   Lab Results  Component Value Date   TRIG 85.0 11/26/2011   Lab Results  Component Value Date   CHOLHDL 3 11/26/2011     Assessment & Plan  Elevated BP Well controlled on current meds.  Right hip pain Encouraged Salon pas, given Baclofen to use qhs prn, encouraged moist heat and gentle stretch

## 2013-04-12 ENCOUNTER — Encounter: Payer: Self-pay | Admitting: Family Medicine

## 2013-04-12 ENCOUNTER — Ambulatory Visit (INDEPENDENT_AMBULATORY_CARE_PROVIDER_SITE_OTHER): Payer: Medicare Other | Admitting: Family Medicine

## 2013-04-12 ENCOUNTER — Ambulatory Visit (HOSPITAL_BASED_OUTPATIENT_CLINIC_OR_DEPARTMENT_OTHER)
Admission: RE | Admit: 2013-04-12 | Discharge: 2013-04-12 | Disposition: A | Discharge status: Home / Self Care | Payer: Medicare Other | Source: Ambulatory Visit | Attending: Family Medicine | Admitting: Family Medicine

## 2013-04-12 VITALS — BP 100/62 | HR 103 | Temp 98.3°F | Ht 71.0 in | Wt 171.1 lb

## 2013-04-12 DIAGNOSIS — J984 Other disorders of lung: Secondary | ICD-10-CM | POA: Insufficient documentation

## 2013-04-12 DIAGNOSIS — IMO0001 Reserved for inherently not codable concepts without codable children: Secondary | ICD-10-CM

## 2013-04-12 DIAGNOSIS — R0602 Shortness of breath: Secondary | ICD-10-CM

## 2013-04-12 DIAGNOSIS — R079 Chest pain, unspecified: Secondary | ICD-10-CM | POA: Insufficient documentation

## 2013-04-12 DIAGNOSIS — K219 Gastro-esophageal reflux disease without esophagitis: Secondary | ICD-10-CM

## 2013-04-12 DIAGNOSIS — E785 Hyperlipidemia, unspecified: Secondary | ICD-10-CM

## 2013-04-12 DIAGNOSIS — R1013 Epigastric pain: Secondary | ICD-10-CM

## 2013-04-12 DIAGNOSIS — R030 Elevated blood-pressure reading, without diagnosis of hypertension: Secondary | ICD-10-CM

## 2013-04-12 DIAGNOSIS — R197 Diarrhea, unspecified: Secondary | ICD-10-CM

## 2013-04-12 DIAGNOSIS — R000 Tachycardia, unspecified: Secondary | ICD-10-CM

## 2013-04-12 HISTORY — DX: Gastro-esophageal reflux disease without esophagitis: K21.9

## 2013-04-12 LAB — CBC
Hemoglobin: 16.6 g/dL (ref 13.0–17.0)
MCH: 30.2 pg (ref 26.0–34.0)
MCV: 85.3 fL (ref 78.0–100.0)
RBC: 5.5 MIL/uL (ref 4.22–5.81)

## 2013-04-12 MED ORDER — RANITIDINE HCL 150 MG PO TABS: 150.0000 mg | ORAL_TABLET | Freq: Two times a day (BID) | ORAL | Status: DC

## 2013-04-12 MED ORDER — ASPIRIN EC 81 MG PO TBEC: 81.0000 mg | DELAYED_RELEASE_TABLET | Freq: Every day | ORAL | Status: DC

## 2013-04-12 NOTE — Assessment & Plan Note (Signed)
Worse when lying down. CXR unremarkable. Check a 2 d echo. Return or seek care if symptoms worsen

## 2013-04-12 NOTE — Assessment & Plan Note (Addendum)
Restart probiotics which seemed to be helping, started on Ranitidine and Tums and check H Pylori today. Avoid offending foods

## 2013-04-12 NOTE — Assessment & Plan Note (Signed)
resolved 

## 2013-04-12 NOTE — Assessment & Plan Note (Signed)
Recheck lipid panel. Start krill oil caps

## 2013-04-12 NOTE — Assessment & Plan Note (Addendum)
Well controlled but mildly tachycardic today, will check labs including thyroid and 2d Echo if persistent will need to consider mediction and cardiology referral. Start an 81 mg Aspirin daily

## 2013-04-12 NOTE — Progress Notes (Signed)
Patient ID: Francis Shaw, male   DOB: 01-Jul-1941, 72 y.o.   MRN: [id number] Francis Shaw [id number] January 08, 1941 04/12/2013      Progress Note-Follow Up  Subjective  Chief Complaint  Chief Complaint  Patient presents with  . Cough    and chest congestion X 2 weeks at night when laying down- raspy and sob- no phlegm    HPI  Patient is 72 year old Caucasian male who is in today for complaints of cough. He reports he felt congested and has been struggling with a cough for about 2 weeks now. Says his throat feels scratchy and raspy. He denies any phlegm production. Very minor head congestion as well but also no rhinorrhea. No fevers or chills. No malaise or myalgias. Does note his cough is somewhat worse when he lies down. He has a sense of rattling in his chest when he lies on his side right worse than left. He doesn't note occasionally feels his heart speeding up but denies irregular beat. Sometimes has some shortness of breath. Does acknowledge he has some dyspepsia and burping as well. As well as some epigastric discomfort at times. Not associated with other symptoms generally. Vicks nasal spray helps his nasal congestion when it does occur  Past Medical History  Diagnosis Date  . Chicken pox as a child  . Measles as a child  . Mumps as a child  . Arthritis     shoulders  . 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 Skin lesion 11/26/2011  . Elevated BP 11/26/2011  . Preventative health care 11/26/2011  . Hyperlipidemia 03/05/2012  . Dermatitis 11/22/2012  . Diarrhea 11/22/2012  . Right hip pain 03/04/2013  . Esophageal reflux 04/12/2013  . SOB (shortness of breath) 04/12/2013    Past Surgical History  Procedure Laterality Date  . Right foot sugery  1993    shattered heel  . 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 Stroke Father   . Asthma Mother     low back pain    History   Social History  . Marital Status: Married    Spouse  Name: N/A    Number of Children: N/A  . Years of Education: N/A   Occupational History  . Not on file.   Social History Main Topics  . Smoking status: Former Smoker -- 1.00 packs/day for 18 years    Types: Cigarettes    Quit date: 09/23/1977  . Smokeless tobacco: Never Used  . Alcohol Use: Yes     Comment: socially  . Drug Use: No  . Sexually Active: Yes   Other Topics Concern  . Not on file   Social History Narrative  . No narrative on file    Current Outpatient Prescriptions on File Prior to Visit  Medication Sig Dispense Refill  . baclofen (LIORESAL) 10 MG tablet Take 1 tablet (10 mg total) by mouth at bedtime as needed (pain).  30 each  0  . clobetasol cream (TEMOVATE) 0.05 % Apply topically 2 (two) times daily as needed.  30 g  0  . hydrOXYzine (ATARAX/VISTARIL) 10 MG tablet Take 1 tablet (10 mg total) by mouth at bedtime as needed for itching.  30 tablet  0  . loratadine (CLARITIN) 10 MG tablet Take 1 tablet (10 mg total) by mouth daily as needed for allergies.  30 tablet  5  . Multiple Vitamins-Minerals (CENTRUM SILVER PO) Take 1  tablet by mouth daily.      . NON FORMULARY megared daily      . zoster vaccine live, PF, (ZOSTAVAX) 14782 UNT/0.65ML injection Inject 19,400 Units into the skin once.  1 each  0   No current facility-administered medications on file prior to visit.    No Known Allergies  Review of Systems  Review of Systems  Constitutional: Negative for fever and malaise/fatigue.  HENT: Positive for congestion.   Eyes: Negative for pain and discharge.  Respiratory: Positive for cough and shortness of breath.   Cardiovascular: Positive for palpitations and orthopnea. Negative for chest pain, claudication and leg swelling.  Gastrointestinal: Positive for heartburn and abdominal pain. Negative for nausea, vomiting, diarrhea, constipation, blood in stool and melena.  Genitourinary: Negative for dysuria.  Musculoskeletal: Negative for falls.  Skin:  Negative for rash.  Neurological: Negative for loss of consciousness and headaches.  Endo/Heme/Allergies: Negative for polydipsia.  Psychiatric/Behavioral: Negative for depression and suicidal ideas. The patient is not nervous/anxious and does not have insomnia.     Objective  BP 100/62  Pulse 103  Temp(Src) 98.3 F (36.8 C) (Oral)  Ht 5\' 11"  (1.803 m)  Wt 171 lb 1.3 oz (77.601 kg)  BMI 23.87 kg/m2  SpO2 96%  Physical Exam  Physical Exam  Constitutional: He is oriented to person, place, and time and well-developed, well-nourished, and in no distress. No distress.  HENT:  Head: Normocephalic and atraumatic.  Eyes: Conjunctivae are normal.  Neck: Neck supple. No thyromegaly present.  Cardiovascular: Normal rate and regular rhythm.  Exam reveals no gallop.   No murmur heard. Pulmonary/Chest: Effort normal and breath sounds normal. No respiratory distress.  Abdominal: He exhibits no distension and no mass. There is no tenderness.  Musculoskeletal: He exhibits no edema.  Neurological: He is alert and oriented to person, place, and time.  Skin: Skin is warm.  Psychiatric: Memory, affect and judgment normal.    Lab Results  Component Value Date   TSH 1.34 11/26/2011   Lab Results  Component Value Date   WBC 6.1 03/02/2013   HGB 16.0 03/02/2013   HCT 45.5 03/02/2013   MCV 86.2 03/02/2013   PLT 185 03/02/2013   Lab Results  Component Value Date   CREATININE 0.8 11/26/2011   BUN 13 11/26/2011   NA 139 11/26/2011   K 4.8 11/26/2011   CL 98 11/26/2011   CO2 28 11/26/2011   Lab Results  Component Value Date   ALT 19 03/02/2013   AST 21 03/02/2013   ALKPHOS 83 03/02/2013   BILITOT 0.6 03/02/2013   Lab Results  Component Value Date   CHOL 185 11/26/2011   Lab Results  Component Value Date   HDL 59.10 11/26/2011   Lab Results  Component Value Date   LDLCALC 109* 11/26/2011   Lab Results  Component Value Date   TRIG 85.0 11/26/2011   Lab Results  Component Value Date   CHOLHDL 3  11/26/2011     Assessment & Plan  Elevated BP Well controlled but mildly tachycardic today, will check labs including thyroid and 2d Echo if persistent will need to consider mediction and cardiology referral. Start an 81 mg Aspirin daily  Hyperlipidemia Recheck lipid panel. Start krill oil caps   Esophageal reflux Restart probiotics which seemed to be helping, started on Ranitidine and Tums and check H Pylori today. Avoid offending foods  Diarrhea resolved  SOB (shortness of breath) Worse when lying down. CXR unremarkable. Check a 2 d  echo. Return or seek care if symptoms worsen

## 2013-04-12 NOTE — Patient Instructions (Addendum)
  Probiotic daily such as Digestive Advantage Start an 81 mg Aspirin daily with food Mucinex 600 mg twice a day for 7-10 days Mag/Zinc/Calcium 1 daily   Gastroesophageal Reflux Disease, Adult Gastroesophageal reflux disease (GERD) happens when acid from your stomach flows up into the esophagus. When acid comes in contact with the esophagus, the acid causes soreness (inflammation) in the esophagus. Over time, GERD may create small holes (ulcers) in the lining of the esophagus. CAUSES   Increased body weight. This puts pressure on the stomach, making acid rise from the stomach into the esophagus.  Smoking. This increases acid production in the stomach.  Drinking alcohol. This causes decreased pressure in the lower esophageal sphincter (valve or ring of muscle between the esophagus and stomach), allowing acid from the stomach into the esophagus.  Late evening meals and a full stomach. This increases pressure and acid production in the stomach.  A malformed lower esophageal sphincter. Sometimes, no cause is found. SYMPTOMS   Burning pain in the lower part of the mid-chest behind the breastbone and in the mid-stomach area. This may occur twice a week or more often.  Trouble swallowing.  Sore throat.  Dry cough.  Asthma-like symptoms including chest tightness, shortness of breath, or wheezing. DIAGNOSIS  Your caregiver may be able to diagnose GERD based on your symptoms. In some cases, X-rays and other tests may be done to check for complications or to check the condition of your stomach and esophagus. TREATMENT  Your caregiver may recommend over-the-counter or prescription medicines to help decrease acid production. Ask your caregiver before starting or adding any new medicines.  HOME CARE INSTRUCTIONS   Change the factors that you can control. Ask your caregiver for guidance concerning weight loss, quitting smoking, and alcohol consumption.  Avoid foods and drinks that make your  symptoms worse, such as:  Caffeine or alcoholic drinks.  Chocolate.  Peppermint or mint flavorings.  Garlic and onions.  Spicy foods.  Citrus fruits, such as oranges, lemons, or limes.  Tomato-based foods such as sauce, chili, salsa, and pizza.  Fried and fatty foods.  Avoid lying down for the 3 hours prior to your bedtime or prior to taking a nap.  Eat small, frequent meals instead of large meals.  Wear loose-fitting clothing. Do not wear anything tight around your waist that causes pressure on your stomach.  Raise the head of your bed 6 to 8 inches with wood blocks to help you sleep. Extra pillows will not help.  Only take over-the-counter or prescription medicines for pain, discomfort, or fever as directed by your caregiver.  Do not take aspirin, ibuprofen, or other nonsteroidal anti-inflammatory drugs (NSAIDs). SEEK IMMEDIATE MEDICAL CARE IF:   You have pain in your arms, neck, jaw, teeth, or back.  Your pain increases or changes in intensity or duration.  You develop nausea, vomiting, or sweating (diaphoresis).  You develop shortness of breath, or you faint.  Your vomit is green, yellow, black, or looks like coffee grounds or blood.  Your stool is red, bloody, or black. These symptoms could be signs of other problems, such as heart disease, gastric bleeding, or esophageal bleeding. MAKE SURE YOU:   Understand these instructions.  Will watch your condition.  Will get help right away if you are not doing well or get worse. Document Released: 06/19/2005 Document Revised: 12/02/2011 Document Reviewed: 03/29/2011 [employer] Patient Information 2014 [place], [place].

## 2013-04-13 ENCOUNTER — Telehealth: Payer: Self-pay

## 2013-04-13 LAB — HEPATIC FUNCTION PANEL
ALT: 31 U/L (ref 0–53)
Total Protein: 7.1 g/dL (ref 6.0–8.3)

## 2013-04-13 LAB — RENAL FUNCTION PANEL
Albumin: 4.2 g/dL (ref 3.5–5.2)
BUN: 12 mg/dL (ref 6–23)
Calcium: 9.4 mg/dL (ref 8.4–10.5)
Glucose, Bld: 100 mg/dL — ABNORMAL HIGH (ref 70–99)

## 2013-04-13 LAB — LIPID PANEL
Cholesterol: 177 mg/dL (ref 0–200)
VLDL: 21 mg/dL (ref 0–40)

## 2013-04-13 LAB — TSH: TSH: 1.924 u[IU]/mL (ref 0.350–4.500)

## 2013-04-13 NOTE — Progress Notes (Signed)
Quick Note:  Patient Informed and voiced understanding ______ 

## 2013-04-13 NOTE — Telephone Encounter (Signed)
Opened in error

## 2013-04-21 ENCOUNTER — Ambulatory Visit (HOSPITAL_BASED_OUTPATIENT_CLINIC_OR_DEPARTMENT_OTHER)
Admission: RE | Admit: 2013-04-21 | Discharge: 2013-04-21 | Disposition: A | Discharge status: Home / Self Care | Payer: Medicare Other | Source: Ambulatory Visit | Attending: Family Medicine | Admitting: Family Medicine

## 2013-04-21 ENCOUNTER — Other Ambulatory Visit: Payer: Self-pay | Admitting: Family Medicine

## 2013-04-21 DIAGNOSIS — I517 Cardiomegaly: Secondary | ICD-10-CM

## 2013-04-21 DIAGNOSIS — I369 Nonrheumatic tricuspid valve disorder, unspecified: Secondary | ICD-10-CM

## 2013-04-21 DIAGNOSIS — I079 Rheumatic tricuspid valve disease, unspecified: Secondary | ICD-10-CM | POA: Insufficient documentation

## 2013-04-21 DIAGNOSIS — R000 Tachycardia, unspecified: Secondary | ICD-10-CM

## 2013-04-21 DIAGNOSIS — I080 Rheumatic disorders of both mitral and aortic valves: Secondary | ICD-10-CM | POA: Insufficient documentation

## 2013-04-21 DIAGNOSIS — I5040 Unspecified combined systolic (congestive) and diastolic (congestive) heart failure: Secondary | ICD-10-CM

## 2013-04-21 NOTE — Progress Notes (Signed)
*  PRELIMINARY RESULTS* Echocardiogram 2D Echocardiogram has been performed.  Francis Shaw 04/21/2013, 11:24 AM

## 2013-04-28 ENCOUNTER — Inpatient Hospital Stay (HOSPITAL_COMMUNITY): Payer: Medicare Other

## 2013-04-28 ENCOUNTER — Ambulatory Visit (INDEPENDENT_AMBULATORY_CARE_PROVIDER_SITE_OTHER): Payer: Medicare Other | Admitting: Cardiology

## 2013-04-28 ENCOUNTER — Encounter: Payer: Self-pay | Admitting: Cardiology

## 2013-04-28 ENCOUNTER — Encounter (HOSPITAL_COMMUNITY): Payer: Self-pay | Admitting: General Practice

## 2013-04-28 ENCOUNTER — Inpatient Hospital Stay (HOSPITAL_COMMUNITY)
Admission: AD | Admit: 2013-04-28 | Discharge: 2013-04-29 | DRG: 310 | Disposition: A | Discharge status: Home / Self Care | Payer: Medicare Other | Source: Ambulatory Visit | Attending: Cardiology | Admitting: Cardiology

## 2013-04-28 VITALS — BP 130/80 | HR 129 | Ht 71.0 in | Wt 172.0 lb

## 2013-04-28 DIAGNOSIS — R0602 Shortness of breath: Secondary | ICD-10-CM | POA: Diagnosis present

## 2013-04-28 DIAGNOSIS — K219 Gastro-esophageal reflux disease without esophagitis: Secondary | ICD-10-CM | POA: Diagnosis present

## 2013-04-28 DIAGNOSIS — I447 Left bundle-branch block, unspecified: Secondary | ICD-10-CM | POA: Diagnosis present

## 2013-04-28 DIAGNOSIS — Z87891 Personal history of nicotine dependence: Secondary | ICD-10-CM

## 2013-04-28 DIAGNOSIS — I4891 Unspecified atrial fibrillation: Secondary | ICD-10-CM | POA: Diagnosis present

## 2013-04-28 DIAGNOSIS — E785 Hyperlipidemia, unspecified: Secondary | ICD-10-CM | POA: Diagnosis present

## 2013-04-28 DIAGNOSIS — I420 Dilated cardiomyopathy: Secondary | ICD-10-CM

## 2013-04-28 DIAGNOSIS — I428 Other cardiomyopathies: Secondary | ICD-10-CM

## 2013-04-28 DIAGNOSIS — I4892 Unspecified atrial flutter: Principal | ICD-10-CM | POA: Diagnosis present

## 2013-04-28 DIAGNOSIS — R000 Tachycardia, unspecified: Secondary | ICD-10-CM | POA: Diagnosis present

## 2013-04-28 HISTORY — DX: Other cardiomyopathies: I42.8

## 2013-04-28 HISTORY — DX: Unspecified atrial flutter: I48.92

## 2013-04-28 HISTORY — DX: Unspecified atrial fibrillation: I48.91

## 2013-04-28 LAB — COMPREHENSIVE METABOLIC PANEL
ALT: 18 U/L (ref 0–53)
AST: 19 U/L (ref 0–37)
Albumin: 3.3 g/dL — ABNORMAL LOW (ref 3.5–5.2)
CO2: 26 mEq/L (ref 19–32)
Calcium: 9.1 mg/dL (ref 8.4–10.5)
Sodium: 139 mEq/L (ref 135–145)
Total Protein: 6.1 g/dL (ref 6.0–8.3)

## 2013-04-28 LAB — CBC WITH DIFFERENTIAL/PLATELET
Eosinophils Absolute: 0.2 10*3/uL (ref 0.0–0.7)
Eosinophils Relative: 2 % (ref 0–5)
HCT: 41.1 % (ref 39.0–52.0)
Hemoglobin: 14.7 g/dL (ref 13.0–17.0)
Lymphocytes Relative: 16 % (ref 12–46)
Lymphs Abs: 1 10*3/uL (ref 0.7–4.0)
MCH: 31.3 pg (ref 26.0–34.0)
MCV: 87.4 fL (ref 78.0–100.0)
Monocytes Relative: 13 % — ABNORMAL HIGH (ref 3–12)
RBC: 4.7 MIL/uL (ref 4.22–5.81)
WBC: 6.5 10*3/uL (ref 4.0–10.5)

## 2013-04-28 MED ORDER — ACETAMINOPHEN 325 MG PO TABS: 650.0000 mg | ORAL_TABLET | ORAL | Status: DC | PRN

## 2013-04-28 MED ORDER — ONDANSETRON HCL 4 MG/2ML IJ SOLN: 4.0000 mg | Freq: Four times a day (QID) | INTRAMUSCULAR | Status: DC | PRN

## 2013-04-28 MED ORDER — FAMOTIDINE 20 MG PO TABS
20.0000 mg | ORAL_TABLET | Freq: Two times a day (BID) | ORAL | Status: DC
  Administered 2013-04-28 – 2013-04-29 (×2): 20 mg via ORAL
  Filled 2013-04-28 (×3): qty 1

## 2013-04-28 MED ORDER — MAGNESIUM OXIDE 400 (241.3 MG) MG PO TABS
200.0000 mg | ORAL_TABLET | Freq: Every day | ORAL | Status: DC
  Administered 2013-04-29: 200 mg via ORAL
  Filled 2013-04-28: qty 0.5

## 2013-04-28 MED ORDER — DILTIAZEM LOAD VIA INFUSION
15.0000 mg | Freq: Once | INTRAVENOUS | Status: AC
  Administered 2013-04-28: 15 mg via INTRAVENOUS
  Filled 2013-04-28: qty 15

## 2013-04-28 MED ORDER — SODIUM CHLORIDE 0.9 % IJ SOLN: 3.0000 mL | INTRAMUSCULAR | Status: DC | PRN

## 2013-04-28 MED ORDER — DILTIAZEM HCL 100 MG IV SOLR
5.0000 mg/h | INTRAVENOUS | Status: DC
  Administered 2013-04-28 – 2013-04-29 (×3): 5 mg/h via INTRAVENOUS
  Filled 2013-04-28 (×2): qty 100

## 2013-04-28 MED ORDER — LORATADINE 10 MG PO TABS
10.0000 mg | ORAL_TABLET | Freq: Every day | ORAL | Status: DC | PRN
  Filled 2013-04-28: qty 1

## 2013-04-28 MED ORDER — ADULT MULTIVITAMIN W/MINERALS CH
1.0000 | ORAL_TABLET | Freq: Every day | ORAL | Status: DC
Start: 2013-04-29 — End: 2013-04-29
  Administered 2013-04-29: 1 via ORAL
  Filled 2013-04-28: qty 1

## 2013-04-28 MED ORDER — GUAIFENESIN ER 600 MG PO TB12
1200.0000 mg | ORAL_TABLET | Freq: Two times a day (BID) | ORAL | Status: DC
  Administered 2013-04-28 – 2013-04-29 (×2): 1200 mg via ORAL
  Filled 2013-04-28 (×3): qty 2

## 2013-04-28 MED ORDER — APIXABAN 5 MG PO TABS
5.0000 mg | ORAL_TABLET | Freq: Two times a day (BID) | ORAL | Status: DC
  Administered 2013-04-28 – 2013-04-29 (×2): 5 mg via ORAL
  Filled 2013-04-28 (×3): qty 1

## 2013-04-28 MED ORDER — SODIUM CHLORIDE 0.9 % IJ SOLN: 3.0000 mL | Freq: Two times a day (BID) | INTRAMUSCULAR | Status: DC

## 2013-04-28 MED ORDER — CENTRUM SILVER PO TABS: 1.0000 | ORAL_TABLET | Freq: Every day | ORAL | Status: DC

## 2013-04-28 MED ORDER — MAGNESIUM-ZINC 133.33-5 MG PO TABS: 2.0000 | ORAL_TABLET | Freq: Every day | ORAL | Status: DC

## 2013-04-28 MED ORDER — SODIUM CHLORIDE 0.9 % IV SOLN: 250.0000 mL | INTRAVENOUS | Status: DC | PRN

## 2013-04-28 NOTE — Progress Notes (Signed)
HPI: 72 year old male for evaluation of dyspnea and cardiomyopathy. Chest x-ray in July of 2014 showed biapical scarring. Echocardiogram in July of 2014 showed moderate left ventricular enlargement, mild left ventricular hypertrophy and an ejection fraction of 30-35% with diffuse hypokinesis. There was mild left atrial enlargement. Laboratories in July of 2014 showed a potassium of 5.4. BUN and creatinine 12 and 0.84. TSH 1.924. Hemoglobin 16.6. Patient states that for the past 2 weeks he has had orthopnea, dyspnea on exertion and a dry cough. He notices chest tightness when lying flat and improves with sitting up. He has noticed some palpitations at night. There is no pedal edema. These symptoms led to his echocardiogram which is described above.  Current Outpatient Prescriptions  Medication Sig Dispense Refill  . aspirin EC 81 MG tablet Take 1 tablet (81 mg total) by mouth daily.      . clobetasol cream (TEMOVATE) 0.05 % Apply topically 2 (two) times daily as needed.  30 g  0  . guaiFENesin (MUCINEX) 600 MG 12 hr tablet Take 1,200 mg by mouth 2 (two) times daily.      . hydrOXYzine (ATARAX/VISTARIL) 10 MG tablet Take 1 tablet (10 mg total) by mouth at bedtime as needed for itching.  30 tablet  0  . loratadine (CLARITIN) 10 MG tablet Take 1 tablet (10 mg total) by mouth daily as needed for allergies.  30 tablet  5  . MAGNESIUM-ZINC PO Take 1 tablet by mouth daily.      . Multiple Vitamins-Minerals (CENTRUM SILVER PO) Take 1 tablet by mouth daily.      . NON FORMULARY megared daily      . ranitidine (ZANTAC) 150 MG tablet Take 1 tablet (150 mg total) by mouth 2 (two) times daily.  60 tablet  2  . zoster vaccine live, PF, (ZOSTAVAX) 75643 UNT/0.65ML injection Inject 19,400 Units into the skin once.  1 each  0   No current facility-administered medications for this visit.    No Known Allergies  Past Medical History  Diagnosis Date  . Chicken pox as a child  . Measles as a child  . Mumps as a  child  . 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 Skin lesion 11/26/2011  . Preventative health care 11/26/2011  . Hyperlipidemia 03/05/2012  . Dermatitis 11/22/2012  . Esophageal reflux 04/12/2013  . Pericarditis     Past Surgical History  Procedure Laterality Date  . Right foot sugery  1993    shattered heel  . 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 Tonsillectomy      History   Social History  . Marital Status: Married    Spouse Name: N/A    Number of Children: 2  . Years of Education: N/A   Occupational History  . Not on file.   Social History Main Topics  . Smoking status: Former Smoker -- 1.00 packs/day for 18 years    Types: Cigarettes    Quit date: 09/23/1977  . Smokeless tobacco: Never Used  . Alcohol Use: Yes     Comment: socially  . Drug Use: No  . Sexually Active: Yes   Other Topics Concern  . Not on file   Social History Narrative  . No narrative on file    Family History  Problem Relation Age of Onset  . Stroke Father   . Asthma Mother     low back pain    ROS: no fevers or chills, productive cough, hemoptysis,  dysphasia, odynophagia, melena, hematochezia, dysuria, hematuria, rash, seizure activity, orthopnea, PND, pedal edema, claudication. Remaining systems are negative.  Physical Exam:   Blood pressure 130/80, pulse 129, height 5\' 11"  (1.803 m), weight 172 lb (78.019 kg).  General:  Well developed/well nourished in NAD Skin warm/dry Patient not depressed No peripheral clubbing Back-normal HEENT-normal/normal eyelids Neck supple/normal carotid upstroke bilaterally; no bruits; no JVD; no thyromegaly chest - CTA/ normal expansion CV - tachycardic and irregular/normal S1 and S2; 1/6 SEM, rubs or gallops;  PMI nondisplaced Abdomen -NT/ND, no HSM, no mass, + bowel sounds, no bruit 2+ femoral pulses, no bruits Ext-no edema, chords, 2+ DP Neuro-grossly nonfocal  ECG atrial flutter with left bundle branch block.

## 2013-04-28 NOTE — Progress Notes (Signed)
Pt received to [facility]. Feels well, no complaints. Still aflutter rate 140's with LBBB, BP stable. Will carry out plan as previously discussed. Dayna Dunn PA-C

## 2013-04-28 NOTE — Progress Notes (Signed)
HR now 76 a-flutter; Cardizem gtt infusing @ 5 mg/hr; will cont. To monitor.

## 2013-04-28 NOTE — Assessment & Plan Note (Signed)
The patient is a newly diagnosed atrial flutter. He is asymptomatic. His heart rate is 150 today. 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 I will admit and begin IV Cardizem for rate control. Begin Apixaban 5 mg by mouth twice a day. I will ask electrophysiology to review for consideration of ablation. He will need 5 doses of apixaban before the procedure. Hopefully we can control his rate and then he can be discharged and return for an outpatient TEE guided flutter ablation (5th dose will be Friday PM). Note TSH is normal.

## 2013-04-28 NOTE — Assessment & Plan Note (Signed)
Related to cardiomyopathy, atrial flutter and CHF.

## 2013-04-28 NOTE — H&P (Signed)
Francis Shaw  04/28/2013 12:00 PM   Office Visit  MRN:  [id number]   Description: 72 year old male  Provider: [name], MD  Department: [facility]        Referring Provider    Bradd Canary, MD      Diagnoses    SOB (shortness of breath)    -  Primary    786.05    Atrial flutter        427.32    Congestive dilated cardiomyopathy        425.4      Reason for Visit    Shortness of Breath    New patient evaluation         Progress Notes    [name], MD at 04/28/2013 12:42 PM    Status: Signed                    HPI: 72 year old male for evaluation of dyspnea and cardiomyopathy. Chest x-ray in July of 2014 showed biapical scarring. Echocardiogram in July of 2014 showed moderate left ventricular enlargement, mild left ventricular hypertrophy and an ejection fraction of 30-35% with diffuse hypokinesis. There was mild left atrial enlargement. Laboratories in July of 2014 showed a potassium of 5.4. BUN and creatinine 12 and 0.84. TSH 1.924. Hemoglobin 16.6. Patient states that for the past 2 weeks he has had orthopnea, dyspnea on exertion and a dry cough. He notices chest tightness when lying flat and improves with sitting up. He has noticed some palpitations at night. There is no pedal edema. These symptoms led to his echocardiogram which is described above.    Current Outpatient Prescriptions   Medication  Sig  Dispense  Refill   .  aspirin EC 81 MG tablet  Take 1 tablet (81 mg total) by mouth daily.         .  clobetasol cream (TEMOVATE) 0.05 %  Apply topically 2 (two) times daily as needed.   30 g   0   .  guaiFENesin (MUCINEX) 600 MG 12 hr tablet  Take 1,200 mg by mouth 2 (two) times daily.         .  hydrOXYzine (ATARAX/VISTARIL) 10 MG tablet  Take 1 tablet (10 mg total) by mouth at bedtime as needed for itching.   30 tablet   0   .  loratadine (CLARITIN) 10 MG tablet  Take 1 tablet (10 mg total) by mouth daily as needed for allergies.   30 tablet    5   .  MAGNESIUM-ZINC PO  Take 1 tablet by mouth daily.         .  Multiple Vitamins-Minerals (CENTRUM SILVER PO)  Take 1 tablet by mouth daily.         .  NON FORMULARY  megared daily         .  ranitidine (ZANTAC) 150 MG tablet  Take 1 tablet (150 mg total) by mouth 2 (two) times daily.   60 tablet   2   .  zoster vaccine live, PF, (ZOSTAVAX) 24401 UNT/0.65ML injection  Inject 19,400 Units into the skin once.   1 each   0       No current facility-administered medications for this visit.        No Known Allergies  Past Medical History   Diagnosis  Date   .  Chicken pox  as a child   .  Measles  as  a child   .  Mumps  as a child   .  Arthritis of shoulder  11/26/2011   .  Skin lesion  11/26/2011   .  Preventative health care  11/26/2011   .  Hyperlipidemia  03/05/2012   .  Dermatitis  11/22/2012   .  Esophageal reflux  04/12/2013   .  Pericarditis           Past Surgical History   Procedure  Laterality  Date   .  Right foot sugery    1993       shattered heel   .  Amputated index finger    1996       right hand   .  Tonsillectomy             History       Social History   .  Marital Status:  Married       Spouse Name:  N/A       Number of Children:  2   .  Years of Education:  N/A       Occupational History   .  Not on file.       Social History Main Topics   .  Smoking status:  Former Smoker -- 1.00 packs/day for 18 years       Types:  Cigarettes       Quit date:  09/23/1977   .  Smokeless tobacco:  Never Used   .  Alcohol Use:  Yes         Comment: socially   .  Drug Use:  No   .  Sexually Active:  Yes       Other Topics  Concern   .  Not on file       Social History Narrative   .  No narrative on file         Family History   Problem  Relation  Age of Onset   .  Stroke  Father     .  Asthma  Mother         low back pain        ROS: no fevers or chills, productive cough, hemoptysis, dysphasia, odynophagia, melena, hematochezia, dysuria,  hematuria, rash, seizure activity, orthopnea, PND, pedal edema, claudication. Remaining systems are negative.   Physical Exam:    Blood pressure 130/80, pulse 129, height 5\' 11"  (1.803 m), weight 172 lb (78.019 kg).   General:  Well developed/well nourished in NAD Skin warm/dry Patient not depressed No peripheral clubbing Back-normal HEENT-normal/normal eyelids Neck supple/normal carotid upstroke bilaterally; no bruits; no JVD; no thyromegaly chest - CTA/ normal expansion CV - tachycardic and irregular/normal S1 and S2; 1/6 SEM, rubs or gallops;  PMI nondisplaced Abdomen -NT/ND, no HSM, no mass, + bowel sounds, no bruit 2+ femoral pulses, no bruits Ext-no edema, chords, 2+ DP Neuro-grossly nonfocal   ECG atrial flutter with left bundle branch block.                    Atrial flutter - [name], MD at 04/28/2013 12:39 PM    Status: Written Related Problem: Atrial flutter           The patient is a newly diagnosed atrial flutter. He is asymptomatic. His heart rate is 150 today. 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 I will admit and begin IV Cardizem for rate control. Begin Apixaban 5 mg by mouth  twice a day. I will ask electrophysiology to review for consideration of ablation. He will need 5 doses of apixaban before the procedure. Hopefully we can control his rate and then he can be discharged and return for an outpatient TEE guided flutter ablation (5th dose will be Friday PM). Note TSH is normal.         Congestive dilated cardiomyopathy - [name], MD at 04/28/2013 12:41 PM    Status: Written Related Problem: Congestive dilated cardiomyopathy           Patient has a cardiomyopathy that is most likely tachycardia mediated from his atrial flutter. Plan flutter ablation. Repeat echocardiogram 8 weeks following procedure. Hopefully LV function will have improved. Otherwise he would need further evaluation such as cardiac  catheterization. We will add ACE inhibitor and beta blocker later if his blood pressure allows.         SOB (shortness of breath) - [name], MD at 04/28/2013 12:42 PM    Status: Written Related Problem: SOB (shortness of breath)           Related to cardiomyopathy, atrial flutter and CHF.    Francis Shaw

## 2013-04-28 NOTE — Assessment & Plan Note (Signed)
Patient has a cardiomyopathy that is most likely tachycardia mediated from his atrial flutter. Plan flutter ablation. Repeat echocardiogram 8 weeks following procedure. Hopefully LV function will have improved. Otherwise he would need further evaluation such as cardiac catheterization. We will add ACE inhibitor and beta blocker later if his blood pressure allows.

## 2013-04-29 ENCOUNTER — Encounter (HOSPITAL_COMMUNITY): Payer: Self-pay | Admitting: Nurse Practitioner

## 2013-04-29 DIAGNOSIS — I4891 Unspecified atrial fibrillation: Secondary | ICD-10-CM | POA: Diagnosis present

## 2013-04-29 DIAGNOSIS — I428 Other cardiomyopathies: Secondary | ICD-10-CM | POA: Diagnosis present

## 2013-04-29 LAB — BASIC METABOLIC PANEL
BUN: 13 mg/dL (ref 6–23)
CO2: 27 mEq/L (ref 19–32)
Calcium: 8.9 mg/dL (ref 8.4–10.5)
GFR calc non Af Amer: 82 mL/min — ABNORMAL LOW (ref >90)
Glucose, Bld: 103 mg/dL — ABNORMAL HIGH (ref 70–99)
Potassium: 4.1 mEq/L (ref 3.5–5.1)
Sodium: 140 mEq/L (ref 135–145)

## 2013-04-29 LAB — LIPID PANEL
Cholesterol: 134 mg/dL (ref 0–200)
HDL: 50 mg/dL (ref >39)
LDL Cholesterol: 67 mg/dL (ref 0–99)
Triglycerides: 84 mg/dL (ref <150)

## 2013-04-29 LAB — CBC
HCT: 41.7 % (ref 39.0–52.0)
Hemoglobin: 14.4 g/dL (ref 13.0–17.0)
MCH: 30.7 pg (ref 26.0–34.0)
MCHC: 34.5 g/dL (ref 30.0–36.0)
RBC: 4.69 MIL/uL (ref 4.22–5.81)

## 2013-04-29 LAB — HEMOGLOBIN A1C: Mean Plasma Glucose: 103 mg/dL (ref <117)

## 2013-04-29 MED ORDER — CARVEDILOL 6.25 MG PO TABS: 12.5000 mg | ORAL_TABLET | Freq: Two times a day (BID) | ORAL | Status: DC

## 2013-04-29 MED ORDER — CARVEDILOL 6.25 MG PO TABS
6.2500 mg | ORAL_TABLET | Freq: Two times a day (BID) | ORAL | Status: DC
  Administered 2013-04-29: 6.25 mg via ORAL
  Filled 2013-04-29 (×2): qty 1

## 2013-04-29 MED ORDER — FUROSEMIDE 20 MG PO TABS: 20.0000 mg | ORAL_TABLET | Freq: Every day | ORAL | Status: DC | PRN

## 2013-04-29 MED ORDER — CARVEDILOL 6.25 MG PO TABS: 6.2500 mg | ORAL_TABLET | Freq: Two times a day (BID) | ORAL | Status: DC

## 2013-04-29 MED ORDER — CARVEDILOL 12.5 MG PO TABS: 12.5000 mg | ORAL_TABLET | Freq: Two times a day (BID) | ORAL | Status: DC

## 2013-04-29 MED ORDER — APIXABAN 5 MG PO TABS: 5.0000 mg | ORAL_TABLET | Freq: Two times a day (BID) | ORAL | Status: DC

## 2013-04-29 NOTE — Progress Notes (Signed)
Utilization review completed. Tymara Saur, RN, BSN. 

## 2013-04-29 NOTE — Discharge Summary (Signed)
Patient ID: Francis Shaw,  MRN: [id number], DOB/AGE: Nov 30, 1940 72 y.o.  Admit date: 04/28/2013 Discharge date: 04/29/2013  Primary Care Provider: Danise Edge Primary Cardiologist: B. Jens Som, MD - [place]  Discharge Diagnoses Principal Problem:   Atrial flutter Active Problems:   Nonischemic cardiomyopathy   Atrial fibrillation  Allergies No Known Allergies  Procedures  none  History of Present Illness  72 y/o male that recently developed orthopnea, dyspnea, and a dry cough.  An echocardiogram on 7/30, revealed an EF of 30-35% with diffuse hypokinesis.  He was referred to cardiology and seen in clinic on 8/6, where he was found to be in atrial flutter with a HR of 150.  Decision was made to admit him to [facility] for further evaluation and possible EP evaluation.  Hospital Course  Following admission, pt was started on apixaban therapy for anticoagulation.  He was initially placed on IV diltiazem, with rate control achieved.  He was found to have atrial fibrillation as well as atrial flutter and thus decision was made to forego EP evaluation as he was no longer an ideal aflutter ablation candidate.  IV diltiazem was discontinued in favor or oral beta blocker therapy and rates have remained stable at rest but elevated into the 130's with ambulation.  His BP is stable.  We will titrate his coreg to 12.5mg  bid to start tomorrow morning and will arrange for f/u in our office next week to re-eval rate control.  He will be discharged home today.  Provided that rate control is adequate upon f/u, he will then f/u with Dr. Jens Som in [facility] in 4 wks with plan for DCCV.  If rate is elevated @ f/u, we would plan to pursue TEE and DCCV sooner.  Discharge Vitals Blood pressure 116/75, pulse 65, temperature 98.2 F (36.8 C), temperature source Oral, resp. rate 18, height 5\' 11"  (1.803 m), weight 170 lb 6.7 oz (77.3 kg), SpO2 95.00%.  Filed Weights   04/28/13 1602 04/29/13 0508  Weight:  170 lb 3.2 oz (77.202 kg) 170 lb 6.7 oz (77.3 kg)   Labs  CBC  Recent Labs  04/28/13 1700 04/29/13 0535  WBC 6.5 7.0  NEUTROABS 4.5  --   HGB 14.7 14.4  HCT 41.1 41.7  MCV 87.4 88.9  PLT 149* 136*   Basic Metabolic Panel  Recent Labs  04/28/13 1700 04/29/13 0535  NA 139 140  K 3.8 4.1  CL 105 105  CO2 26 27  GLUCOSE 128* 103*  BUN 14 13  CREATININE 0.83 0.97  CALCIUM 9.1 8.9  MG 2.1  --    Liver Function Tests  Recent Labs  04/28/13 1700  AST 19  ALT 18  ALKPHOS 76  BILITOT 0.7  PROT 6.1  ALBUMIN 3.3*   Hemoglobin A1C  Recent Labs  04/29/13 0535  HGBA1C 5.2   Fasting Lipid Panel  Recent Labs  04/29/13 0535  CHOL 134  HDL 50  LDLCALC 67  TRIG 84  CHOLHDL 2.7   Disposition  Pt is being discharged home today in good condition.  Follow-up Plans & Appointments      Follow-up Information   Follow up with [name], MD On 06/02/2013. (10:45 PM)    Contact information:   [address] [place], [place] [address]       Follow up with [facility]. (we will contact you to f/u with PA in office next week to assess your HR control.)    Contact information:  [address] [place] [phone]     Discharge Medications    Medication List    STOP taking these medications       aspirin EC 81 MG tablet      TAKE these medications       apixaban 5 MG Tabs tablet  Commonly known as:  ELIQUIS  Take 1 tablet (5 mg total) by mouth 2 (two) times daily.     carvedilol 12.5 MG tablet  Commonly known as:  COREG  Take 1 tablet (12.5 mg total) by mouth 2 (two) times daily with a meal.     CENTRUM SILVER PO  Take 1 tablet by mouth daily.     furosemide 20 MG tablet  Commonly known as:  LASIX  Take 1 tablet (20 mg total) by mouth daily as needed for fluid (weight gain of > 2 lbs in 1 day or 5 lbs over the course of a week.).     guaiFENesin 600 MG 12 hr tablet  Commonly known as:  MUCINEX  Take 1,200 mg by mouth  2 (two) times daily.     KRILL OIL PO  Take 1 capsule by mouth daily.     MAGNESIUM-ZINC PO  Take 2 tablets by mouth 2 (two) times daily.      Outstanding Labs/Studies  F/U Echo in approx 8 wks.  Duration of Discharge Encounter   Greater than 30 minutes including physician time.  Signed, Nicolasa Ducking NP 04/29/2013, 6:16 PM

## 2013-04-29 NOTE — Discharge Summary (Signed)
See progress notes Francis Shaw  

## 2013-04-29 NOTE — Care Management Note (Signed)
    Page 1 of 1   04/29/2013     4:26:57 PM   CARE MANAGEMENT NOTE 04/29/2013  Patient:  Francis Shaw, Francis Shaw   Account Number:  [id number]  Date Initiated:  04/29/2013  Documentation initiated by:  Nesta Scaturro  Subjective/Objective Assessment:   PT ADM ON 04/28/13 WITH AFIB/FLUTTER.  PTA, PT INDEPENDENT, LIVES WITH SPOUSE.     Action/Plan:   CM REFERRAL FOR ELIQUIS.  PT GIVEN 30 DAY FREE TRIAL CARD. WILL CHECK PHARMACY COPAYS.   Anticipated DC Date:  04/29/2013   Anticipated DC Plan:  HOME/SELF CARE      DC Planning Services  CM consult  Medication Assistance      Choice offered to / List presented to:             Status of service:  Completed, signed off Medicare Important Message given?   (If response is "NO", the following Medicare IM given date fields will be blank) Date Medicare IM given:   Date Additional Medicare IM given:    Discharge Disposition:  HOME/SELF CARE  Per UR Regulation:  Reviewed for med. necessity/level of care/duration of stay  If discussed at Long Length of Stay Meetings, dates discussed:    Comments:  04/29/13 Aliannah Holstrom,RN,BSN [phone] per [employer] rx, medication covered, auth required phone [id number], tier 3, co-pay at retail is $45. PT MADE AWARE OF COPAY INFO.

## 2013-04-29 NOTE — Progress Notes (Signed)
   Subjective:  Denies CP or dyspnea   Objective:  Filed Vitals:   04/28/13 2141 04/29/13 0059 04/29/13 0508 04/29/13 1334  BP: 108/69 102/69 109/61 104/54  Pulse: 72 56 76 57  Temp: 98.1 F (36.7 C) 98.6 F (37 C) 97.6 F (36.4 C) 98.2 F (36.8 C)  TempSrc: Oral Oral Oral Oral  Resp: 18 17 16 18   Height:      Weight:   170 lb 6.7 oz (77.3 kg)   SpO2: 96% 96% 95% 95%    Intake/Output from previous day:  Intake/Output Summary (Last 24 hours) at 04/29/13 1432 Last data filed at 04/29/13 1300  Gross per 24 hour  Intake    482 ml  Output    200 ml  Net    282 ml    Physical Exam: Physical exam: Well-developed well-nourished in no acute distress.  Skin is warm and dry.  HEENT is normal.  Neck is supple.  Chest is clear to auscultation with normal expansion.  Cardiovascular exam is irregular Abdominal exam nontender or distended. No masses palpated. Extremities show no edema. neuro grossly intact    Lab Results: Basic Metabolic Panel:  Recent Labs  19/14/78 1700 04/29/13 0535  NA 139 140  K 3.8 4.1  CL 105 105  CO2 26 27  GLUCOSE 128* 103*  BUN 14 13  CREATININE 0.83 0.97  CALCIUM 9.1 8.9  MG 2.1  --    CBC:  Recent Labs  04/28/13 1700 04/29/13 0535  WBC 6.5 7.0  NEUTROABS 4.5  --   HGB 14.7 14.4  HCT 41.1 41.7  MCV 87.4 88.9  PLT 149* 136*     Assessment/Plan:  Atrial flutter - The patient has newly diagnosed atrial flutter. Also noted to have intermittent atrial fibrillation on telemetry and am ECG. He is asymptomatic. Continue Apixaban 5 mg by mouth twice a day. Embolic risk factors of CHF and age > 55. Note TSH is normal. Given that he has both documented atrial flutter and atrial fibrillation I will not pursue electrophysiology consultation for ablation. DC Cardizem. Begin carvedilol 6.25 mg by mouth twice a day. If heart rate stable discharge this p.m. Plan elective cardioversion in approximately 4 weeks. If he does not hold sinus rhythm  will consider an antiarrhythmic.  Congestive dilated cardiomyopathy - Patient has a cardiomyopathy that is most likely tachycardia mediated from his atrial flutter/fibrillation. Plan rate control with carvedilol and cardioversion in 4 weeks. Repeat echocardiogram 8 weeks following procedure. Hopefully LV function will have improved. Otherwise he would need further ischemia evaluation. We will add ACE inhibitor later if his blood pressure allows.   SOB - Related to cardiomyopathy, atrial flutter/fibrillation and CHF. Will discharge on Lasix 20 mg daily as needed.  If heart rate controlled discharge this p.m. with plan as outlined above. Followup with me in 4 weeks.  >30 min PA and physician time D2 [name] 04/29/2013, 2:32 PM   `

## 2013-04-30 ENCOUNTER — Other Ambulatory Visit: Payer: Self-pay | Admitting: *Deleted

## 2013-04-30 MED ORDER — APIXABAN 5 MG PO TABS: 5.0000 mg | ORAL_TABLET | Freq: Two times a day (BID) | ORAL | Status: DC

## 2013-05-20 ENCOUNTER — Telehealth: Payer: Self-pay | Admitting: Family Medicine

## 2013-05-20 ENCOUNTER — Ambulatory Visit (INDEPENDENT_AMBULATORY_CARE_PROVIDER_SITE_OTHER): Payer: Medicare Other | Admitting: Family Medicine

## 2013-05-20 ENCOUNTER — Encounter: Payer: Self-pay | Admitting: Family Medicine

## 2013-05-20 VITALS — BP 100/70 | HR 45 | Temp 98.0°F | Ht 71.0 in | Wt 171.0 lb

## 2013-05-20 DIAGNOSIS — Z0000 Encounter for general adult medical examination without abnormal findings: Secondary | ICD-10-CM

## 2013-05-20 DIAGNOSIS — IMO0001 Reserved for inherently not codable concepts without codable children: Secondary | ICD-10-CM

## 2013-05-20 DIAGNOSIS — E785 Hyperlipidemia, unspecified: Secondary | ICD-10-CM

## 2013-05-20 DIAGNOSIS — K219 Gastro-esophageal reflux disease without esophagitis: Secondary | ICD-10-CM

## 2013-05-20 DIAGNOSIS — I4891 Unspecified atrial fibrillation: Secondary | ICD-10-CM

## 2013-05-20 DIAGNOSIS — R030 Elevated blood-pressure reading, without diagnosis of hypertension: Secondary | ICD-10-CM

## 2013-05-20 MED ORDER — RANITIDINE HCL 150 MG PO TABS: 150.0000 mg | ORAL_TABLET | Freq: Two times a day (BID) | ORAL | Status: DC | PRN

## 2013-05-20 NOTE — Patient Instructions (Signed)
  Ranitidine on hold at pharmacy if heartburn returns  Gastroesophageal Reflux Disease, Adult Gastroesophageal reflux disease (GERD) happens when acid from your stomach flows up into the esophagus. When acid comes in contact with the esophagus, the acid causes soreness (inflammation) in the esophagus. Over time, GERD may create small holes (ulcers) in the lining of the esophagus. CAUSES   Increased body weight. This puts pressure on the stomach, making acid rise from the stomach into the esophagus.  Smoking. This increases acid production in the stomach.  Drinking alcohol. This causes decreased pressure in the lower esophageal sphincter (valve or ring of muscle between the esophagus and stomach), allowing acid from the stomach into the esophagus.  Late evening meals and a full stomach. This increases pressure and acid production in the stomach.  A malformed lower esophageal sphincter. Sometimes, no cause is found. SYMPTOMS   Burning pain in the lower part of the mid-chest behind the breastbone and in the mid-stomach area. This may occur twice a week or more often.  Trouble swallowing.  Sore throat.  Dry cough.  Asthma-like symptoms including chest tightness, shortness of breath, or wheezing. DIAGNOSIS  Your caregiver may be able to diagnose GERD based on your symptoms. In some cases, X-rays and other tests may be done to check for complications or to check the condition of your stomach and esophagus. TREATMENT  Your caregiver may recommend over-the-counter or prescription medicines to help decrease acid production. Ask your caregiver before starting or adding any new medicines.  HOME CARE INSTRUCTIONS   Change the factors that you can control. Ask your caregiver for guidance concerning weight loss, quitting smoking, and alcohol consumption.  Avoid foods and drinks that make your symptoms worse, such as:  Caffeine or alcoholic drinks.  Chocolate.  Peppermint or mint  flavorings.  Garlic and onions.  Spicy foods.  Citrus fruits, such as oranges, lemons, or limes.  Tomato-based foods such as sauce, chili, salsa, and pizza.  Fried and fatty foods.  Avoid lying down for the 3 hours prior to your bedtime or prior to taking a nap.  Eat small, frequent meals instead of large meals.  Wear loose-fitting clothing. Do not wear anything tight around your waist that causes pressure on your stomach.  Raise the head of your bed 6 to 8 inches with wood blocks to help you sleep. Extra pillows will not help.  Only take over-the-counter or prescription medicines for pain, discomfort, or fever as directed by your caregiver.  Do not take aspirin, ibuprofen, or other nonsteroidal anti-inflammatory drugs (NSAIDs). SEEK IMMEDIATE MEDICAL CARE IF:   You have pain in your arms, neck, jaw, teeth, or back.  Your pain increases or changes in intensity or duration.  You develop nausea, vomiting, or sweating (diaphoresis).  You develop shortness of breath, or you faint.  Your vomit is green, yellow, black, or looks like coffee grounds or blood.  Your stool is red, bloody, or black. These symptoms could be signs of other problems, such as heart disease, gastric bleeding, or esophageal bleeding. MAKE SURE YOU:   Understand these instructions.  Will watch your condition.  Will get help right away if you are not doing well or get worse. Document Released: 06/19/2005 Document Revised: 12/02/2011 Document Reviewed: 03/29/2011 [facility] Patient Information 2014 [place], [place].

## 2013-05-20 NOTE — Telephone Encounter (Signed)
Check out comments: Labs for next visit, liver, renal, cbc, tsh, magnesium, hepatic   Patient has appointment in early December and will be going to [place] [facility]

## 2013-05-24 ENCOUNTER — Encounter: Payer: Self-pay | Admitting: Family Medicine

## 2013-05-24 NOTE — Assessment & Plan Note (Signed)
Controlled with current meds, tolerating Eliquis

## 2013-05-24 NOTE — Assessment & Plan Note (Signed)
Numbers wnl  today 

## 2013-05-24 NOTE — Progress Notes (Signed)
Patient ID: Francis Shaw, male   DOB: 09-26-1940, 72 y.o.   MRN: [id number] ADIB WAHBA [id number] 15-Jan-1941 05/24/2013      Progress Note-Follow Up  Subjective  Chief Complaint  Chief Complaint  Patient presents with  . Follow-up    5 week    HPI  Patient is a 72 year old male in today. No recent concerns today. Tolerating Eliquis, minimal dyspepsia at this time, Probiotics have been helpful. No recent illness. No chest pain or palpitations. No shortness of breath fevers or chills. No GI or GU concerns at this time.  Past Medical History  Diagnosis Date  . Skin lesion 11/26/2011  . Dermatitis 11/22/2012  . Esophageal reflux 04/12/2013  . Pericarditis   . Atrial flutter   . Atrial fibrillation     a. Eliquis initiated 04/2013.  Marland Kitchen Nonischemic cardiomyopathy     a. presumed to be tachy mediated;  b. 03/2013 Echo: EF 30-35%, diff HK, mild LVH, mildly dil LA.    Past Surgical History  Procedure Laterality Date  . Calcaneal osteotomy Right 1993    "stapled it; screwed it" (04/28/2013)  . 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 Tonsillectomy  1940's  . Calcaneus hardware removal Right ~ 1994    Family History  Problem Relation Age of Onset  . Stroke Father   . Asthma Mother     low back pain    History   Social History  . Marital Status: Married    Spouse Name: N/A    Number of Children: 2  . Years of Education: N/A   Occupational History  . Not on file.   Social History Main Topics  . Smoking status: Former Smoker -- 1.00 packs/day for 18 years    Types: Cigarettes    Quit date: 09/23/1977  . Smokeless tobacco: Never Used  . Alcohol Use: 8.4 oz/week    14 Cans of beer per week     Comment: 04/28/2013 "couple beers/day"  . Drug Use: No  . Sexual Activity: Yes   Other Topics Concern  . Not on file   Social History Narrative  . No narrative on file    Current Outpatient Prescriptions on File Prior to Visit  Medication Sig Dispense Refill  .  apixaban (ELIQUIS) 5 MG TABS tablet Take 1 tablet (5 mg total) by mouth 2 (two) times daily.  90 tablet  3  . carvedilol (COREG) 12.5 MG tablet Take 1 tablet (12.5 mg total) by mouth 2 (two) times daily with a meal.  60 tablet  6  . furosemide (LASIX) 20 MG tablet Take 1 tablet (20 mg total) by mouth daily as needed for fluid (weight gain of > 2 lbs in 1 day or 5 lbs over the course of a week.).  30 tablet  2  . KRILL OIL PO Take 1 capsule by mouth daily.      . Multiple Vitamins-Minerals (CENTRUM SILVER PO) Take 1 tablet by mouth daily.       No current facility-administered medications on file prior to visit.    No Known Allergies  Review of Systems  Review of Systems  Constitutional: Negative for fever and malaise/fatigue.  HENT: Negative for congestion.   Eyes: Negative for discharge.  Respiratory: Negative for shortness of breath.   Cardiovascular: Negative for chest pain, palpitations and leg swelling.  Gastrointestinal: Negative for nausea, abdominal pain and diarrhea.  Genitourinary: Negative for dysuria.  Musculoskeletal: Negative for falls.  Skin: Negative for rash.  Neurological: Negative for loss of consciousness and headaches.  Endo/Heme/Allergies: Negative for polydipsia.  Psychiatric/Behavioral: Negative for depression and suicidal ideas. The patient is not nervous/anxious and does not have insomnia.     Objective  BP 100/70  Pulse 45  Temp(Src) 98 F (36.7 C) (Oral)  Ht 5\' 11"  (1.803 m)  Wt 171 lb 0.6 oz (77.583 kg)  BMI 23.87 kg/m2  SpO2 98%  Physical Exam  Physical Exam  Constitutional: He is oriented to person, place, and time and well-developed, well-nourished, and in no distress. No distress.  HENT:  Head: Normocephalic and atraumatic.  Eyes: Conjunctivae are normal.  Neck: Neck supple. No thyromegaly present.  Cardiovascular: Normal heart sounds.   bradycardia  Pulmonary/Chest: Effort normal and breath sounds normal. No respiratory distress.   Abdominal: He exhibits no distension and no mass. There is no tenderness.  Musculoskeletal: He exhibits no edema.  Neurological: He is alert and oriented to person, place, and time.  Skin: Skin is warm.  Psychiatric: Memory, affect and judgment normal.    Lab Results  Component Value Date   TSH 1.924 04/12/2013   Lab Results  Component Value Date   WBC 7.0 04/29/2013   HGB 14.4 04/29/2013   HCT 41.7 04/29/2013   MCV 88.9 04/29/2013   PLT 136* 04/29/2013   Lab Results  Component Value Date   CREATININE 0.97 04/29/2013   BUN 13 04/29/2013   NA 140 04/29/2013   K 4.1 04/29/2013   CL 105 04/29/2013   CO2 27 04/29/2013   Lab Results  Component Value Date   ALT 18 04/28/2013   AST 19 04/28/2013   ALKPHOS 76 04/28/2013   BILITOT 0.7 04/28/2013   Lab Results  Component Value Date   CHOL 134 04/29/2013   Lab Results  Component Value Date   HDL 50 04/29/2013   Lab Results  Component Value Date   LDLCALC 67 04/29/2013   Lab Results  Component Value Date   TRIG 84 04/29/2013   Lab Results  Component Value Date   CHOLHDL 2.7 04/29/2013     Assessment & Plan  Elevated BP Numbers wnl today  Esophageal reflux Probiotics helpful, avoid offending foods  Hyperlipidemia Numbers wnl with diet changes  Atrial fibrillation Controlled with current meds, tolerating Eliquis

## 2013-05-24 NOTE — Assessment & Plan Note (Signed)
Probiotics helpful, avoid offending foods

## 2013-05-24 NOTE — Assessment & Plan Note (Signed)
Numbers wnl with diet changes

## 2013-06-02 ENCOUNTER — Ambulatory Visit (INDEPENDENT_AMBULATORY_CARE_PROVIDER_SITE_OTHER): Payer: Medicare Other | Admitting: Cardiology

## 2013-06-02 ENCOUNTER — Encounter: Payer: Self-pay | Admitting: Cardiology

## 2013-06-02 VITALS — BP 118/80 | HR 99 | Wt 172.0 lb

## 2013-06-02 DIAGNOSIS — I4892 Unspecified atrial flutter: Secondary | ICD-10-CM

## 2013-06-02 DIAGNOSIS — I428 Other cardiomyopathies: Secondary | ICD-10-CM

## 2013-06-02 DIAGNOSIS — I4891 Unspecified atrial fibrillation: Secondary | ICD-10-CM

## 2013-06-02 DIAGNOSIS — E785 Hyperlipidemia, unspecified: Secondary | ICD-10-CM

## 2013-06-02 DIAGNOSIS — I420 Dilated cardiomyopathy: Secondary | ICD-10-CM

## 2013-06-02 DIAGNOSIS — Z01812 Encounter for preprocedural laboratory examination: Secondary | ICD-10-CM

## 2013-06-02 LAB — CBC WITH DIFFERENTIAL/PLATELET
Basophils Absolute: 0 10*3/uL (ref 0.0–0.1)
HCT: 44.8 % (ref 39.0–52.0)
Hemoglobin: 15.8 g/dL (ref 13.0–17.0)
Lymphocytes Relative: 19 % (ref 12–46)
Monocytes Absolute: 0.9 10*3/uL (ref 0.1–1.0)
Monocytes Relative: 13 % — ABNORMAL HIGH (ref 3–12)
Neutro Abs: 4.6 10*3/uL (ref 1.7–7.7)
Neutrophils Relative %: 65 % (ref 43–77)
RDW: 13.2 % (ref 11.5–15.5)
WBC: 7 10*3/uL (ref 4.0–10.5)

## 2013-06-02 LAB — PROTIME-INR
INR: 1.34 (ref <1.50)
Prothrombin Time: 16.4 seconds — ABNORMAL HIGH (ref 11.6–15.2)

## 2013-06-02 LAB — BASIC METABOLIC PANEL WITH GFR
Chloride: 105 mEq/L (ref 96–112)
GFR, Est African American: >89 mL/min
GFR, Est Non African American: >89 mL/min
Potassium: 4.9 mEq/L (ref 3.5–5.3)
Sodium: 137 mEq/L (ref 135–145)

## 2013-06-02 NOTE — Patient Instructions (Addendum)
Your physician has recommended that you have a Cardioversion (DCCV). Electrical Cardioversion uses a jolt of electricity to your heart either through paddles or wired patches attached to your chest. This is a controlled, usually prescheduled, procedure. Defibrillation is done under light anesthesia in the hospital, and you usually go home the day of the procedure. This is done to get your heart back into a normal rhythm. You are not awake for the procedure. Please see the instruction sheet given to you today. Schedule on 06/07/2013 @ 9:00am.  Your physician recommends that you schedule a follow-up appointment in: 2 months with Dr. Jens Som  Your physician recommends that you continue on your current medications as directed. Please refer to the Current Medication list given to you today.

## 2013-06-02 NOTE — Progress Notes (Signed)
HPI: FU atrial fibrillation. Echocardiogram in July of 2014 showed moderate left ventricular enlargement, mild left ventricular hypertrophy and an ejection fraction of 30-35% with diffuse hypokinesis. There was mild left atrial enlargement. TSH in July of 2014 normal. When I initially saw the patient in August of 2014 he was in atrial flutter at a rate of 150. He was admitted to telemetry and placed on rate controlling medications. He was noted to have intermittent atrial fibrillation as well. It was therefore felt atrial flutter ablation was not indicated. The plan was cardioversion once he had been anticoagulated for 4 consecutive weeks. Echocardiogram would be repeated once sinus reestablished to make sure that LV function had improved. Otherwise he would need ischemia evaluation. Since he was discharged, he denies dyspnea, chest pain, palpitations, syncope or bleeding.   Current Outpatient Prescriptions  Medication Sig Dispense Refill  . apixaban (ELIQUIS) 5 MG TABS tablet Take 1 tablet (5 mg total) by mouth 2 (two) times daily.  90 tablet  3  . carvedilol (COREG) 12.5 MG tablet Take 1 tablet (12.5 mg total) by mouth 2 (two) times daily with a meal.  60 tablet  6  . clobetasol cream (TEMOVATE) 0.05 % prn      . guaiFENesin (MUCINEX) 600 MG 12 hr tablet Take 1,200 mg by mouth daily.      Marland Kitchen KRILL OIL PO Take 1 capsule by mouth daily.      . Multiple Vitamins-Minerals (CENTRUM SILVER PO) Take 1 tablet by mouth daily.       No current facility-administered medications for this visit.     Past Medical History  Diagnosis Date  . Skin lesion 11/26/2011  . Dermatitis 11/22/2012  . Esophageal reflux 04/12/2013  . Pericarditis   . Atrial flutter   . Atrial fibrillation     a. Eliquis initiated 04/2013.  Marland Kitchen Nonischemic cardiomyopathy     a. presumed to be tachy mediated;  b. 03/2013 Echo: EF 30-35%, diff HK, mild LVH, mildly dil LA.    Past Surgical History  Procedure Laterality Date  . Calcaneal  osteotomy Right 1993    "stapled it; screwed it" (04/28/2013)  . 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 Tonsillectomy  1940's  . Calcaneus hardware removal Right ~ 1994    History   Social History  . Marital Status: Married    Spouse Name: N/A    Number of Children: 2  . Years of Education: N/A   Occupational History  . Not on file.   Social History Main Topics  . Smoking status: Former Smoker -- 1.00 packs/day for 18 years    Types: Cigarettes    Quit date: 09/23/1977  . Smokeless tobacco: Never Used  . Alcohol Use: 8.4 oz/week    14 Cans of beer per week     Comment: 04/28/2013 "couple beers/day"  . Drug Use: No  . Sexual Activity: Yes   Other Topics Concern  . Not on file   Social History Narrative  . No narrative on file    ROS: no fevers or chills, productive cough, hemoptysis, dysphasia, odynophagia, melena, hematochezia, dysuria, hematuria, rash, seizure activity, orthopnea, PND, pedal edema, claudication. Remaining systems are negative.  Physical Exam: Well-developed well-nourished in no acute distress.  Skin is warm and dry.  HEENT is normal.  Neck is supple.  Chest is clear to auscultation with normal expansion.  Cardiovascular exam tachycardic and irregular Abdominal exam nontender or distended. No masses palpated. Extremities show  no edema. neuro grossly intact  ECG atrial flutter with left bundle branch block.

## 2013-06-02 NOTE — Assessment & Plan Note (Signed)
Management per primary care. 

## 2013-06-02 NOTE — Assessment & Plan Note (Signed)
LV function reduced on recent echocardiogram. This is felt most likely tachycardia mediated. We will plan to repeat his echocardiogram in the future once sinus has been reestablished or his rate is adequately controlled. If it remains reduced he'll need an ischemia evaluation. Continue Coreg. If followup echocardiograms show reduced LV function I will add an ACE inhibitor.

## 2013-06-02 NOTE — Assessment & Plan Note (Addendum)
Patient has both documented atrial flutter and atrial fibrillation. His rate is improved. Continue carvedilol. Continue apixaban. Plan to proceed with elective cardioversion. Hopefully he will hold sinus rhythm. If not we will consider rate control/anticoagulation versus rhythm control. His rate has been somewhat difficult to control. We did not pursue atrial flutter ablation as he also has documented atrial fibrillation. Embolic risk factors of reduced LV function and age greater than 54.

## 2013-06-03 ENCOUNTER — Other Ambulatory Visit: Payer: Self-pay | Admitting: Cardiology

## 2013-06-03 DIAGNOSIS — I4892 Unspecified atrial flutter: Secondary | ICD-10-CM

## 2013-06-07 ENCOUNTER — Ambulatory Visit (HOSPITAL_COMMUNITY)
Admission: RE | Admit: 2013-06-07 | Discharge: 2013-06-07 | Disposition: A | Discharge status: Home / Self Care | Payer: Medicare Other | Source: Ambulatory Visit | Attending: Cardiology | Admitting: Cardiology

## 2013-06-07 ENCOUNTER — Encounter (HOSPITAL_COMMUNITY)
Admission: RE | Disposition: A | Discharge status: Home / Self Care | Payer: Medicare Other | Source: Ambulatory Visit | Attending: Cardiology

## 2013-06-07 DIAGNOSIS — I4891 Unspecified atrial fibrillation: Secondary | ICD-10-CM | POA: Insufficient documentation

## 2013-06-07 DIAGNOSIS — Z538 Procedure and treatment not carried out for other reasons: Secondary | ICD-10-CM | POA: Insufficient documentation

## 2013-06-07 SURGERY — CANCELLED PROCEDURE

## 2013-06-07 NOTE — Progress Notes (Signed)
During preprocedure workup, pt found to be in sinus rythym. Confirmed by EKG. Dr Jens Som notified.procedure cancelled

## 2013-06-07 NOTE — H&P (Signed)
Francis Shaw  06/02/2013 10:45 AM   Office Visit  MRN:  [id number]   Description: 72 year old male  Provider: [name], MD  Department: [facility]        Referring Provider    Bradd Canary, MD      Diagnoses    Atrial flutter    -  Primary    [phone]    Congestive dilated cardiomyopathy        425.4    Hyperlipidemia        272.4    Pre-procedure lab exam        V72.63    Atrial fibrillation        427.31      Reason for Visit    Follow-up    Atrial fibrillation         Progress Notes    [name], MD at 06/02/2013 11:40 AM    Status: Signed                      HPI: FU atrial fibrillation. Echocardiogram in July of 2014 showed moderate left ventricular enlargement, mild left ventricular hypertrophy and an ejection fraction of 30-35% with diffuse hypokinesis. There was mild left atrial enlargement. TSH in July of 2014 normal. When I initially saw the patient in August of 2014 he was in atrial flutter at a rate of 150. He was admitted to telemetry and placed on rate controlling medications. He was noted to have intermittent atrial fibrillation as well. It was therefore felt atrial flutter ablation was not indicated. The plan was cardioversion once he had been anticoagulated for 4 consecutive weeks. Echocardiogram would be repeated once sinus reestablished to make sure that LV function had improved. Otherwise he would need ischemia evaluation. Since he was discharged, he denies dyspnea, chest pain, palpitations, syncope or bleeding.      Current Outpatient Prescriptions   Medication  Sig  Dispense  Refill   .  apixaban (ELIQUIS) 5 MG TABS tablet  Take 1 tablet (5 mg total) by mouth 2 (two) times daily.   90 tablet   3   .  carvedilol (COREG) 12.5 MG tablet  Take 1 tablet (12.5 mg total) by mouth 2 (two) times daily with a meal.   60 tablet   6   .  clobetasol cream (TEMOVATE) 0.05 %  prn         .  guaiFENesin (MUCINEX) 600 MG 12 hr tablet  Take  1,200 mg by mouth daily.         Marland Kitchen  KRILL OIL PO  Take 1 capsule by mouth daily.         .  Multiple Vitamins-Minerals (CENTRUM SILVER PO)  Take 1 tablet by mouth daily.             No current facility-administered medications for this visit.           Past Medical History   Diagnosis  Date   .  Skin lesion  11/26/2011   .  Dermatitis  11/22/2012   .  Esophageal reflux  04/12/2013   .  Pericarditis     .  Atrial flutter     .  Atrial fibrillation         a. Eliquis initiated 04/2013.   Marland Kitchen  Nonischemic cardiomyopathy         a. presumed to be tachy mediated;  b. 03/2013 Echo: EF 30-35%,  diff HK, mild LVH, mildly dil LA.         Past Surgical History   Procedure  Laterality  Date   .  Calcaneal osteotomy  Right  1993       "stapled it; screwed it" (04/28/2013)   .  Finger amputation  Right  1996       index (04/28/2013)   .  Tonsillectomy    1940's   .  Calcaneus hardware removal  Right  ~ 1994         History       Social History   .  Marital Status:  Married       Spouse Name:  N/A       Number of Children:  2   .  Years of Education:  N/A       Occupational History   .  Not on file.       Social History Main Topics   .  Smoking status:  Former Smoker -- 1.00 packs/day for 18 years       Types:  Cigarettes       Quit date:  09/23/1977   .  Smokeless tobacco:  Never Used   .  Alcohol Use:  8.4 oz/week       14 Cans of beer per week         Comment: 04/28/2013 "couple beers/day"   .  Drug Use:  No   .  Sexual Activity:  Yes       Other Topics  Concern   .  Not on file       Social History Narrative   .  No narrative on file        ROS: no fevers or chills, productive cough, hemoptysis, dysphasia, odynophagia, melena, hematochezia, dysuria, hematuria, rash, seizure activity, orthopnea, PND, pedal edema, claudication. Remaining systems are negative.   Physical Exam: Well-developed well-nourished in no acute distress.   Skin is warm and dry.   HEENT is  normal.   Neck is supple.   Chest is clear to auscultation with normal expansion.   Cardiovascular exam tachycardic and irregular Abdominal exam nontender or distended. No masses palpated. Extremities show no edema. neuro grossly intact   ECG atrial flutter with left bundle branch block.                  Atrial flutter - [name], MD at 06/02/2013 11:01 AM    Status: Edited Related Problem: Atrial flutter           Patient has both documented atrial flutter and atrial fibrillation. His rate is improved. Continue carvedilol. Continue apixaban. Plan to proceed with elective cardioversion. Hopefully he will hold sinus rhythm. If not we will consider rate control/anticoagulation versus rhythm control. His rate has been somewhat difficult to control. We did not pursue atrial flutter ablation as he also has documented atrial fibrillation. Embolic risk factors of reduced LV function and age greater than 24.           Revision History       Date/Time User Action    > 06/02/2013 11:01 AM [name], MD Edit      06/02/2013 11:00 AM [name], MD Create              Congestive dilated cardiomyopathy - [name], MD at 06/02/2013 11:02 AM    Status: Written Related Problem: Congestive dilated cardiomyopathy  LV function reduced on recent echocardiogram. This is felt most likely tachycardia mediated. We will plan to repeat his echocardiogram in the future once sinus has been reestablished or his rate is adequately controlled. If it remains reduced he'll need an ischemia evaluation. Continue Coreg. If followup echocardiograms show reduced LV function I will add an ACE inhibitor.         Hyperlipidemia - [name], MD at 06/02/2013 11:02 AM    Status: Written Related Problem: Hyperlipidemia           Management per primary care.               Vitals - Last Recorded    BP Pulse Wt BMI        118/80 99 172 lb (78.019 kg) 24 kg/m2  For  DCCV; no changes. [name]

## 2013-07-07 ENCOUNTER — Ambulatory Visit (INDEPENDENT_AMBULATORY_CARE_PROVIDER_SITE_OTHER): Payer: Medicare Other | Admitting: Cardiology

## 2013-07-07 ENCOUNTER — Encounter: Payer: Self-pay | Admitting: Cardiology

## 2013-07-07 VITALS — BP 122/80 | HR 59 | Ht 71.0 in | Wt 173.0 lb

## 2013-07-07 DIAGNOSIS — I4892 Unspecified atrial flutter: Secondary | ICD-10-CM

## 2013-07-07 DIAGNOSIS — I4891 Unspecified atrial fibrillation: Secondary | ICD-10-CM

## 2013-07-07 DIAGNOSIS — I428 Other cardiomyopathies: Secondary | ICD-10-CM

## 2013-07-07 DIAGNOSIS — I429 Cardiomyopathy, unspecified: Secondary | ICD-10-CM

## 2013-07-07 MED ORDER — LISINOPRIL 2.5 MG PO TABS: 2.5000 mg | ORAL_TABLET | Freq: Every day | ORAL | Status: DC

## 2013-07-07 NOTE — Patient Instructions (Signed)
Your physician recommends that you schedule a follow-up appointment in: 8 WEEKS WITH DR CRENSHAW IN [place]  START LISINOPRIL 2.5 MG ONCE DAILY  Your physician recommends that you return for lab work in: ONE WEEK IN [place]  Your physician has requested that you have an echocardiogram. Echocardiography is a painless test that uses sound waves to create images of your heart. It provides your doctor with information about the size and shape of your heart and how well your heart's chambers and valves are working. This procedure takes approximately one hour. There are no restrictions for this procedure.IN THE [facility] OFFICE

## 2013-07-07 NOTE — Progress Notes (Signed)
HPI: FU atrial fibrillation. Echocardiogram in July of 2014 showed moderate left ventricular enlargement, mild left ventricular hypertrophy and an ejection fraction of 30-35% with diffuse hypokinesis. There was mild left atrial enlargement. TSH in July of 2014 normal. When I initially saw the patient in August of 2014 he was in atrial flutter at a rate of 150. He was admitted to telemetry and placed on rate controlling medications. He was noted to have intermittent atrial fibrillation as well. It was therefore felt atrial flutter ablation was not indicated. The plan was cardioversion once he had been anticoagulated for 4 consecutive weeks. Echocardiogram would be repeated once sinus reestablished to make sure that LV function had improved. Otherwise he would need ischemia evaluation. Patient was scheduled for elective cardioversion but when he came to the procedure he was in sinus rhythm. Since that time, the patient denies any dyspnea on exertion, orthopnea, PND, pedal edema, palpitations, syncope or chest pain.    Current Outpatient Prescriptions  Medication Sig Dispense Refill  . apixaban (ELIQUIS) 5 MG TABS tablet Take 1 tablet (5 mg total) by mouth 2 (two) times daily.  90 tablet  3  . carvedilol (COREG) 12.5 MG tablet Take 1 tablet (12.5 mg total) by mouth 2 (two) times daily with a meal.  60 tablet  6  . clobetasol cream (TEMOVATE) 0.05 % prn      . guaiFENesin (MUCINEX) 600 MG 12 hr tablet Take 1,200 mg by mouth daily.      Marland Kitchen KRILL OIL PO Take 1 capsule by mouth daily.      . Multiple Vitamins-Minerals (CENTRUM SILVER PO) Take 1 tablet by mouth daily.      . furosemide (LASIX) 20 MG tablet prn       No current facility-administered medications for this visit.     Past Medical History  Diagnosis Date  . Skin lesion 11/26/2011  . Dermatitis 11/22/2012  . Esophageal reflux 04/12/2013  . Pericarditis   . Atrial flutter   . Atrial fibrillation     a. Eliquis initiated 04/2013.  Marland Kitchen  Nonischemic cardiomyopathy     a. presumed to be tachy mediated;  b. 03/2013 Echo: EF 30-35%, diff HK, mild LVH, mildly dil LA.    Past Surgical History  Procedure Laterality Date  . Calcaneal osteotomy Right 1993    "stapled it; screwed it" (04/28/2013)  . 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 Tonsillectomy  1940's  . Calcaneus hardware removal Right ~ 1994    History   Social History  . Marital Status: Married    Spouse Name: N/A    Number of Children: 2  . Years of Education: N/A   Occupational History  . Not on file.   Social History Main Topics  . Smoking status: Former Smoker -- 1.00 packs/day for 18 years    Types: Cigarettes    Quit date: 09/23/1977  . Smokeless tobacco: Never Used  . Alcohol Use: 8.4 oz/week    14 Cans of beer per week     Comment: 04/28/2013 "couple beers/day"  . Drug Use: No  . Sexual Activity: Yes   Other Topics Concern  . Not on file   Social History Narrative  . No narrative on file    ROS: no fevers or chills, productive cough, hemoptysis, dysphasia, odynophagia, melena, hematochezia, dysuria, hematuria, rash, seizure activity, orthopnea, PND, pedal edema, claudication. Remaining systems are negative.  Physical Exam: Well-developed well-nourished in no acute  distress.  Skin is warm and dry.  HEENT is normal.  Neck is supple.  Chest is clear to auscultation with normal expansion.  Cardiovascular exam is regular rate and rhythm.  Abdominal exam nontender or distended. No masses palpated. Extremities show no edema. neuro grossly intact  ECG sinus rhythm, left bundle branch block.

## 2013-07-07 NOTE — Assessment & Plan Note (Signed)
Management per primary care. 

## 2013-07-07 NOTE — Assessment & Plan Note (Signed)
We did not pursue atrial flutter ablation previously because he also had documented atrial fibrillation.

## 2013-07-07 NOTE — Assessment & Plan Note (Signed)
Patient remains in sinus rhythm today. Embolic risk factors of reduced LV function and age greater than 60. Continue apixaban. Continue carvedilol.

## 2013-07-07 NOTE — Assessment & Plan Note (Signed)
Patient's LV function was reduced previously. I thought this may be related to tachycardia. He is now in sinus rhythm. Continue Coreg. Add lisinopril 2.5 mg daily. Check potassium and renal function in one week. Repeat echocardiogram in 6 weeks. If LV function has not improved he will most likely require cardiac catheterization to exclude coronary artery disease as a cause. We would also need to consider ICD.

## 2013-07-14 ENCOUNTER — Telehealth: Payer: Self-pay | Admitting: *Deleted

## 2013-07-14 NOTE — Telephone Encounter (Signed)
Received call from lab at [facility]. Med center requesting order for BMET.  Per Dr. Ludwig Clarks last OV note needs BMET at med center.  Faxed order to [phone].

## 2013-07-15 LAB — BASIC METABOLIC PANEL WITH GFR
CO2: 27 mEq/L (ref 19–32)
Calcium: 9.7 mg/dL (ref 8.4–10.5)
Chloride: 104 mEq/L (ref 96–112)
Glucose, Bld: 80 mg/dL (ref 70–99)
Sodium: 138 mEq/L (ref 135–145)

## 2013-07-29 ENCOUNTER — Other Ambulatory Visit: Payer: Self-pay

## 2013-08-04 ENCOUNTER — Ambulatory Visit: Payer: Medicare Other | Admitting: Cardiology

## 2013-08-16 ENCOUNTER — Ambulatory Visit (HOSPITAL_COMMUNITY): Payer: Medicare Other | Attending: Cardiology | Admitting: Radiology

## 2013-08-16 DIAGNOSIS — I4891 Unspecified atrial fibrillation: Secondary | ICD-10-CM | POA: Insufficient documentation

## 2013-08-16 DIAGNOSIS — I428 Other cardiomyopathies: Secondary | ICD-10-CM | POA: Insufficient documentation

## 2013-08-16 DIAGNOSIS — R0602 Shortness of breath: Secondary | ICD-10-CM | POA: Insufficient documentation

## 2013-08-16 DIAGNOSIS — I4892 Unspecified atrial flutter: Secondary | ICD-10-CM | POA: Insufficient documentation

## 2013-08-16 DIAGNOSIS — I429 Cardiomyopathy, unspecified: Secondary | ICD-10-CM

## 2013-08-16 DIAGNOSIS — I447 Left bundle-branch block, unspecified: Secondary | ICD-10-CM | POA: Insufficient documentation

## 2013-08-16 DIAGNOSIS — I079 Rheumatic tricuspid valve disease, unspecified: Secondary | ICD-10-CM | POA: Insufficient documentation

## 2013-08-16 NOTE — Progress Notes (Signed)
Echocardiogram performed.  

## 2013-08-23 ENCOUNTER — Encounter: Payer: Self-pay | Admitting: Family Medicine

## 2013-08-23 ENCOUNTER — Ambulatory Visit (INDEPENDENT_AMBULATORY_CARE_PROVIDER_SITE_OTHER): Payer: Medicare Other | Admitting: Family Medicine

## 2013-08-23 VITALS — BP 110/62 | HR 94 | Temp 98.1°F | Ht 71.0 in | Wt 173.1 lb

## 2013-08-23 DIAGNOSIS — E785 Hyperlipidemia, unspecified: Secondary | ICD-10-CM

## 2013-08-23 DIAGNOSIS — I4891 Unspecified atrial fibrillation: Secondary | ICD-10-CM

## 2013-08-23 DIAGNOSIS — R030 Elevated blood-pressure reading, without diagnosis of hypertension: Secondary | ICD-10-CM

## 2013-08-23 DIAGNOSIS — R197 Diarrhea, unspecified: Secondary | ICD-10-CM

## 2013-08-23 DIAGNOSIS — IMO0001 Reserved for inherently not codable concepts without codable children: Secondary | ICD-10-CM

## 2013-08-23 MED ORDER — LOSARTAN POTASSIUM 25 MG PO TABS: 25.0000 mg | ORAL_TABLET | Freq: Every day | ORAL | Status: DC

## 2013-08-23 NOTE — Progress Notes (Signed)
Patient ID: Francis Shaw, male   DOB: [birth year], 72 y.o.   MRN: [id number] GIO JANOSKI [id number] 07-18-1941 08/23/2013      Progress Note-Follow Up  Subjective  Chief Complaint  Chief Complaint  Patient presents with  . Follow-up    3 month    HPI   patient is a 72 year old male who is in today for follow up. generally feeling well. No recent illness. Reports improvement shortness of breath. No chest pain or palpitations. No GI or GU concerns at this time. Only major complaint is of a persistent dry cough. It is nonproductive and there is no head congestion fevers or chills. On questioning does believe it started about the time he started lisinopril.  Past Medical History  Diagnosis Date  . Skin lesion 11/26/2011  . Dermatitis 11/22/2012  . Esophageal reflux 04/12/2013  . Pericarditis   . Atrial flutter   . Atrial fibrillation     a. Eliquis initiated 04/2013.  Marland Kitchen Nonischemic cardiomyopathy     a. presumed to be tachy mediated;  b. 03/2013 Echo: EF 30-35%, diff HK, mild LVH, mildly dil LA.    Past Surgical History  Procedure Laterality Date  . Calcaneal osteotomy Right 1993    "stapled it; screwed it" (04/28/2013)  . 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 Tonsillectomy  1940's  . Calcaneus hardware removal Right ~ 1994    Family History  Problem Relation Age of Onset  . Stroke Father   . Asthma Mother     low back pain    History   Social History  . Marital Status: Married    Spouse Name: N/A    Number of Children: 2  . Years of Education: N/A   Occupational History  . Not on file.   Social History Main Topics  . Smoking status: Former Smoker -- 1.00 packs/day for 18 years    Types: Cigarettes    Quit date: 09/23/1977  . Smokeless tobacco: Never Used  . Alcohol Use: 8.4 oz/week    14 Cans of beer per week     Comment: 04/28/2013 "couple beers/day"  . Drug Use: No  . Sexual Activity: Yes   Other Topics Concern  . Not on file   Social  History Narrative  . No narrative on file    Current Outpatient Prescriptions on File Prior to Visit  Medication Sig Dispense Refill  . apixaban (ELIQUIS) 5 MG TABS tablet Take 1 tablet (5 mg total) by mouth 2 (two) times daily.  90 tablet  3  . carvedilol (COREG) 12.5 MG tablet Take 1 tablet (12.5 mg total) by mouth 2 (two) times daily with a meal.  60 tablet  6  . clobetasol cream (TEMOVATE) 0.05 % prn      . furosemide (LASIX) 20 MG tablet prn      . guaiFENesin (MUCINEX) 600 MG 12 hr tablet Take 1,200 mg by mouth daily.      Marland Kitchen KRILL OIL PO Take 1 capsule by mouth daily.      Marland Kitchen lisinopril (PRINIVIL,ZESTRIL) 2.5 MG tablet Take 1 tablet (2.5 mg total) by mouth daily.  90 tablet  3  . Multiple Vitamins-Minerals (CENTRUM SILVER PO) Take 1 tablet by mouth daily.       No current facility-administered medications on file prior to visit.    No Known Allergies  Review of Systems  Review of Systems  Constitutional: Negative for fever and malaise/fatigue.  HENT: Negative for  congestion.   Eyes: Negative for discharge.  Respiratory: Negative for shortness of breath.   Cardiovascular: Negative for chest pain, palpitations and leg swelling.  Gastrointestinal: Negative for nausea, abdominal pain and diarrhea.  Genitourinary: Negative for dysuria.  Musculoskeletal: Negative for falls.  Skin: Negative for rash.  Neurological: Negative for loss of consciousness and headaches.  Endo/Heme/Allergies: Negative for polydipsia.  Psychiatric/Behavioral: Negative for depression and suicidal ideas. The patient is not nervous/anxious and does not have insomnia.     Objective  BP 110/62  Pulse 115  Temp(Src) 98.1 F (36.7 C) (Oral)  Ht 5\' 11"  (1.803 m)  Wt 173 lb 1.9 oz (78.527 kg)  BMI 24.16 kg/m2  SpO2 92%  Physical Exam  Physical Exam  Constitutional: He is oriented to person, place, and time and well-developed, well-nourished, and in no distress. No distress.  HENT:  Head:  Normocephalic and atraumatic.  Eyes: Conjunctivae are normal.  Neck: Neck supple. No thyromegaly present.  Cardiovascular: Normal rate, regular rhythm and normal heart sounds.   No murmur heard. Pulmonary/Chest: Effort normal and breath sounds normal. No respiratory distress.  Abdominal: He exhibits no distension and no mass. There is no tenderness.  Musculoskeletal: He exhibits no edema.  Neurological: He is alert and oriented to person, place, and time.  Skin: Skin is warm.  Psychiatric: Memory, affect and judgment normal.    Lab Results  Component Value Date   TSH 1.924 04/12/2013   Lab Results  Component Value Date   WBC 7.0 06/02/2013   HGB 15.8 06/02/2013   HCT 44.8 06/02/2013   MCV 84.8 06/02/2013   PLT 181 06/02/2013   Lab Results  Component Value Date   CREATININE 0.75 07/14/2013   BUN 14 07/14/2013   NA 138 07/14/2013   K 4.9 07/14/2013   CL 104 07/14/2013   CO2 27 07/14/2013   Lab Results  Component Value Date   ALT 18 04/28/2013   AST 19 04/28/2013   ALKPHOS 76 04/28/2013   BILITOT 0.7 04/28/2013   Lab Results  Component Value Date   CHOL 134 04/29/2013   Lab Results  Component Value Date   HDL 50 04/29/2013   Lab Results  Component Value Date   LDLCALC 67 04/29/2013   Lab Results  Component Value Date   TRIG 84 04/29/2013   Lab Results  Component Value Date   CHOLHDL 2.7 04/29/2013     Assessment & Plan  Elevated BP Well controlled today, is c/o a persistent dry cough so will d/c Lisinopril and start Losartan 25 mg daily  Hyperlipidemia Add krill oil caps daily. Avoid trans fats.   Atrial fibrillation Improved rate control on recheck. No changes.   Diarrhea Improved with probiotics and hi fiber

## 2013-08-23 NOTE — Progress Notes (Signed)
Pre visit review using our clinic review tool, if applicable. No additional management support is needed unless otherwise documented below in the visit note. 

## 2013-08-25 NOTE — Assessment & Plan Note (Signed)
Improved with probiotics and hi fiber

## 2013-08-25 NOTE — Assessment & Plan Note (Signed)
Add krill oil caps daily. Avoid trans fats.

## 2013-08-25 NOTE — Assessment & Plan Note (Signed)
Well controlled today, is c/o a persistent dry cough so will d/c Lisinopril and start Losartan 25 mg daily

## 2013-08-25 NOTE — Assessment & Plan Note (Signed)
Improved rate control on recheck. No changes.

## 2013-09-08 ENCOUNTER — Encounter: Payer: Self-pay | Admitting: *Deleted

## 2013-09-08 ENCOUNTER — Encounter: Payer: Self-pay | Admitting: Cardiology

## 2013-09-08 ENCOUNTER — Ambulatory Visit (INDEPENDENT_AMBULATORY_CARE_PROVIDER_SITE_OTHER): Payer: Medicare Other | Admitting: Cardiology

## 2013-09-08 VITALS — BP 115/70 | HR 92 | Ht 71.0 in | Wt 175.0 lb

## 2013-09-08 DIAGNOSIS — I420 Dilated cardiomyopathy: Secondary | ICD-10-CM

## 2013-09-08 DIAGNOSIS — I428 Other cardiomyopathies: Secondary | ICD-10-CM

## 2013-09-08 DIAGNOSIS — I4891 Unspecified atrial fibrillation: Secondary | ICD-10-CM

## 2013-09-08 NOTE — Progress Notes (Signed)
    HPI: FU atrial fibrillation. Echocardiogram in July of 2014 showed moderate left ventricular enlargement, mild left ventricular hypertrophy and an ejection fraction of 30-35% with diffuse hypokinesis. There was mild left atrial enlargement. TSH in July of 2014 normal. When I initially saw the patient in August of 2014 he was in atrial flutter at a rate of 150. He was admitted to telemetry and placed on rate controlling medications. He was noted to have intermittent atrial fibrillation as well. It was therefore felt atrial flutter ablation was not indicated. The plan was cardioversion once he had been anticoagulated for 4 consecutive weeks. Echocardiogram would be repeated once sinus reestablished to make sure that LV function had improved. Otherwise he would need ischemia evaluation. Patient was scheduled for elective cardioversion but when he came to the procedure he was in sinus rhythm. Echocardiogram repeated in November of 2014. Ejection fraction 20-25% with grade 1 diastolic dysfunction. Mild left atrial enlargement. Since he was last seen, he denies dyspnea, chest pain or syncope. No bleeding.   Current Outpatient Prescriptions  Medication Sig Dispense Refill  . apixaban (ELIQUIS) 5 MG TABS tablet Take 1 tablet (5 mg total) by mouth 2 (two) times daily.  90 tablet  3  . carvedilol (COREG) 12.5 MG tablet Take 1 tablet (12.5 mg total) by mouth 2 (two) times daily with a meal.  60 tablet  6  . clobetasol cream (TEMOVATE) 0.05 % prn      . furosemide (LASIX) 20 MG tablet prn      . guaiFENesin (MUCINEX) 600 MG 12 hr tablet Take 1,200 mg by mouth daily.      . KRILL OIL PO Take 1 capsule by mouth daily.      . losartan (COZAAR) 25 MG tablet Take 1 tablet (25 mg total) by mouth daily.  30 tablet  3  . Multiple Vitamins-Minerals (CENTRUM SILVER PO) Take 1 tablet by mouth daily.       No current facility-administered medications for this visit.     Past Medical History  Diagnosis Date  .  Skin lesion 11/26/2011  . Dermatitis 11/22/2012  . Esophageal reflux 04/12/2013  . Pericarditis   . Atrial flutter   . Atrial fibrillation     a. Eliquis initiated 04/2013.  . Nonischemic cardiomyopathy     a. presumed to be tachy mediated;  b. 03/2013 Echo: EF 30-35%, diff HK, mild LVH, mildly dil LA.    Past Surgical History  Procedure Laterality Date  . Calcaneal osteotomy Right 1993    "stapled it; screwed it" (04/28/2013)  . 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 Tonsillectomy  1940's  . Calcaneus hardware removal Right ~ 1994    History   Social History  . Marital Status: Married    Spouse Name: N/A    Number of Children: 2  . Years of Education: N/A   Occupational History  . Not on file.   Social History Main Topics  . Smoking status: Former Smoker -- 1.00 packs/day for 18 years    Types: Cigarettes    Quit date: 09/23/1977  . Smokeless tobacco: Never Used  . Alcohol Use: 8.4 oz/week    14 Cans of beer per week     Comment: 04/28/2013 "couple beers/day"  . Drug Use: No  . Sexual Activity: Yes   Other Topics Concern  . Not on file   Social History Narrative  . No narrative on file    ROS:   no fevers or chills, productive cough, hemoptysis, dysphasia, odynophagia, melena, hematochezia, dysuria, hematuria, rash, seizure activity, orthopnea, PND, pedal edema, claudication. Remaining systems are negative.  Physical Exam: Well-developed well-nourished in no acute distress.  Skin is warm and dry.  HEENT is normal.  Neck is supple.  Chest is clear to auscultation with normal expansion.  Cardiovascular exam is regular rate and rhythm.  Abdominal exam nontender or distended. No masses palpated. Extremities show no edema. neuro grossly intact      

## 2013-09-08 NOTE — Assessment & Plan Note (Signed)
Patient remains in sinus rhythm on examination. Continue beta blocker and apixaban. 

## 2013-09-08 NOTE — Assessment & Plan Note (Signed)
We did not consider ablation previously because he also has a history of atrial fibrillation. Continue beta blocker and anticoagulation.

## 2013-09-08 NOTE — Assessment & Plan Note (Signed)
Management per primary care. 

## 2013-09-08 NOTE — Patient Instructions (Signed)
Your physician has requested that you have a cardiac catheterization. Cardiac catheterization is used to diagnose and/or treat various heart conditions. Doctors may recommend this procedure for a number of different reasons. The most common reason is to evaluate chest pain. Chest pain can be a symptom of coronary artery disease (CAD), and cardiac catheterization can show whether plaque is narrowing or blocking your heart's arteries. This procedure is also used to evaluate the valves, as well as measure the blood flow and oxygen levels in different parts of your heart. For further information please visit [email or web address] Please follow instruction sheet, as given.   Your physician recommends that you return for lab work on Monday 09-27-2013

## 2013-09-08 NOTE — Assessment & Plan Note (Signed)
Patient's LV function remains decreased. Etiology unclear. I previously felt it may be tachycardia mediated but he remains in sinus rhythm. Previous TSH normal. No history of alcohol abuse. I feel he needs cardiac catheterization to exclude coronary disease. We will arrange as an outpatient. The risks and benefits were discussed and he agrees to proceed. Hold apixaban 2 days prior to the procedure and resume the following day.

## 2013-09-17 ENCOUNTER — Encounter (HOSPITAL_COMMUNITY): Payer: Self-pay | Admitting: Pharmacy Technician

## 2013-09-27 LAB — CBC
HCT: 42.4 % (ref 39.0–52.0)
Hemoglobin: 15.1 g/dL (ref 13.0–17.0)
MCH: 30.4 pg (ref 26.0–34.0)
MCHC: 35.6 g/dL (ref 30.0–36.0)
MCV: 85.3 fL (ref 78.0–100.0)
Platelets: 188 10*3/uL (ref 150–400)
RBC: 4.97 MIL/uL (ref 4.22–5.81)
RDW: 13.4 % (ref 11.5–15.5)
WBC: 6.6 10*3/uL (ref 4.0–10.5)

## 2013-09-27 LAB — BASIC METABOLIC PANEL WITH GFR
BUN: 12 mg/dL (ref 6–23)
CO2: 30 mEq/L (ref 19–32)
Calcium: 9.8 mg/dL (ref 8.4–10.5)
Chloride: 102 mEq/L (ref 96–112)
Creat: 0.78 mg/dL (ref 0.50–1.35)
GFR, Est African American: >89 mL/min
GFR, Est Non African American: >89 mL/min
Glucose, Bld: 88 mg/dL (ref 70–99)
Potassium: 4.8 mEq/L (ref 3.5–5.3)
Sodium: 137 mEq/L (ref 135–145)

## 2013-09-28 LAB — PROTIME-INR
INR: 1.26 (ref <1.50)
Prothrombin Time: 15.6 seconds — ABNORMAL HIGH (ref 11.6–15.2)

## 2013-09-30 ENCOUNTER — Encounter (HOSPITAL_COMMUNITY)
Admission: RE | Disposition: A | Discharge status: Home / Self Care | Payer: Self-pay | Source: Ambulatory Visit | Attending: Cardiovascular Disease

## 2013-09-30 ENCOUNTER — Ambulatory Visit (HOSPITAL_COMMUNITY)
Admission: RE | Admit: 2013-09-30 | Discharge: 2013-09-30 | Disposition: A | Discharge status: Home / Self Care | Payer: Medicare HMO | Source: Ambulatory Visit | Attending: Cardiovascular Disease | Admitting: Cardiovascular Disease

## 2013-09-30 DIAGNOSIS — I509 Heart failure, unspecified: Secondary | ICD-10-CM | POA: Insufficient documentation

## 2013-09-30 DIAGNOSIS — I428 Other cardiomyopathies: Secondary | ICD-10-CM

## 2013-09-30 DIAGNOSIS — I517 Cardiomegaly: Secondary | ICD-10-CM | POA: Insufficient documentation

## 2013-09-30 DIAGNOSIS — I4892 Unspecified atrial flutter: Secondary | ICD-10-CM | POA: Insufficient documentation

## 2013-09-30 DIAGNOSIS — I2510 Atherosclerotic heart disease of native coronary artery without angina pectoris: Secondary | ICD-10-CM | POA: Insufficient documentation

## 2013-09-30 DIAGNOSIS — I319 Disease of pericardium, unspecified: Secondary | ICD-10-CM | POA: Insufficient documentation

## 2013-09-30 DIAGNOSIS — K219 Gastro-esophageal reflux disease without esophagitis: Secondary | ICD-10-CM | POA: Insufficient documentation

## 2013-09-30 DIAGNOSIS — Z87891 Personal history of nicotine dependence: Secondary | ICD-10-CM | POA: Insufficient documentation

## 2013-09-30 DIAGNOSIS — I4891 Unspecified atrial fibrillation: Secondary | ICD-10-CM | POA: Insufficient documentation

## 2013-09-30 HISTORY — PX: LEFT HEART CATHETERIZATION WITH CORONARY ANGIOGRAM: SHX5451

## 2013-09-30 SURGERY — LEFT HEART CATHETERIZATION WITH CORONARY ANGIOGRAM
Anesthesia: LOCAL

## 2013-09-30 MED ORDER — MIDAZOLAM HCL 2 MG/2ML IJ SOLN
INTRAMUSCULAR | Status: AC
  Filled 2013-09-30: qty 2

## 2013-09-30 MED ORDER — HEPARIN (PORCINE) IN NACL 2-0.9 UNIT/ML-% IJ SOLN
INTRAMUSCULAR | Status: AC
  Filled 2013-09-30: qty 1500

## 2013-09-30 MED ORDER — ONDANSETRON HCL 4 MG/2ML IJ SOLN: 4.0000 mg | Freq: Four times a day (QID) | INTRAMUSCULAR | Status: DC | PRN

## 2013-09-30 MED ORDER — ACETAMINOPHEN 325 MG PO TABS: 650.0000 mg | ORAL_TABLET | ORAL | Status: DC | PRN

## 2013-09-30 MED ORDER — LIDOCAINE HCL (PF) 1 % IJ SOLN
INTRAMUSCULAR | Status: AC
  Filled 2013-09-30: qty 30

## 2013-09-30 MED ORDER — VERAPAMIL HCL 2.5 MG/ML IV SOLN
INTRAVENOUS | Status: AC
  Filled 2013-09-30: qty 2

## 2013-09-30 MED ORDER — HEPARIN SODIUM (PORCINE) 1000 UNIT/ML IJ SOLN
INTRAMUSCULAR | Status: AC
  Filled 2013-09-30: qty 1

## 2013-09-30 MED ORDER — FENTANYL CITRATE 0.05 MG/ML IJ SOLN
INTRAMUSCULAR | Status: AC
Start: 2013-09-30 — End: 2013-09-30
  Filled 2013-09-30: qty 2

## 2013-09-30 MED ORDER — SODIUM CHLORIDE 0.9 % IV SOLN
INTRAVENOUS | Status: DC
  Administered 2013-09-30: 09:00:00 via INTRAVENOUS

## 2013-09-30 MED ORDER — ASPIRIN 81 MG PO CHEW
81.0000 mg | CHEWABLE_TABLET | ORAL | Status: DC
  Filled 2013-09-30: qty 1

## 2013-09-30 MED ORDER — SODIUM CHLORIDE 0.9 % IV SOLN: 250.0000 mL | INTRAVENOUS | Status: DC | PRN

## 2013-09-30 MED ORDER — SODIUM CHLORIDE 0.9 % IJ SOLN: 3.0000 mL | INTRAMUSCULAR | Status: DC | PRN

## 2013-09-30 MED ORDER — NITROGLYCERIN 0.2 MG/ML ON CALL CATH LAB
INTRAVENOUS | Status: AC
  Filled 2013-09-30: qty 1

## 2013-09-30 MED ORDER — SODIUM CHLORIDE 0.9 % IV SOLN: INTRAVENOUS | Status: AC

## 2013-09-30 MED ORDER — SODIUM CHLORIDE 0.9 % IJ SOLN: 3.0000 mL | Freq: Two times a day (BID) | INTRAMUSCULAR | Status: DC

## 2013-09-30 NOTE — Discharge Instructions (Signed)

## 2013-09-30 NOTE — H&P (View-Only) (Signed)
HPI: FU atrial fibrillation. Echocardiogram in July of 2014 showed moderate left ventricular enlargement, mild left ventricular hypertrophy and an ejection fraction of 30-35% with diffuse hypokinesis. There was mild left atrial enlargement. TSH in July of 2014 normal. When I initially saw the patient in August of 2014 he was in atrial flutter at a rate of 150. He was admitted to telemetry and placed on rate controlling medications. He was noted to have intermittent atrial fibrillation as well. It was therefore felt atrial flutter ablation was not indicated. The plan was cardioversion once he had been anticoagulated for 4 consecutive weeks. Echocardiogram would be repeated once sinus reestablished to make sure that LV function had improved. Otherwise he would need ischemia evaluation. Patient was scheduled for elective cardioversion but when he came to the procedure he was in sinus rhythm. Echocardiogram repeated in November of 2014. Ejection fraction 20-25% with grade 1 diastolic dysfunction. Mild left atrial enlargement. Since he was last seen, he denies dyspnea, chest pain or syncope. No bleeding.   Current Outpatient Prescriptions  Medication Sig Dispense Refill  . apixaban (ELIQUIS) 5 MG TABS tablet Take 1 tablet (5 mg total) by mouth 2 (two) times daily.  90 tablet  3  . carvedilol (COREG) 12.5 MG tablet Take 1 tablet (12.5 mg total) by mouth 2 (two) times daily with a meal.  60 tablet  6  . clobetasol cream (TEMOVATE) 0.05 % prn      . furosemide (LASIX) 20 MG tablet prn      . guaiFENesin (MUCINEX) 600 MG 12 hr tablet Take 1,200 mg by mouth daily.      Marland Kitchen KRILL OIL PO Take 1 capsule by mouth daily.      Marland Kitchen losartan (COZAAR) 25 MG tablet Take 1 tablet (25 mg total) by mouth daily.  30 tablet  3  . Multiple Vitamins-Minerals (CENTRUM SILVER PO) Take 1 tablet by mouth daily.       No current facility-administered medications for this visit.     Past Medical History  Diagnosis Date  .  Skin lesion 11/26/2011  . Dermatitis 11/22/2012  . Esophageal reflux 04/12/2013  . Pericarditis   . Atrial flutter   . Atrial fibrillation     a. Eliquis initiated 04/2013.  Marland Kitchen Nonischemic cardiomyopathy     a. presumed to be tachy mediated;  b. 03/2013 Echo: EF 30-35%, diff HK, mild LVH, mildly dil LA.    Past Surgical History  Procedure Laterality Date  . Calcaneal osteotomy Right 1993    "stapled it; screwed it" (04/28/2013)  . 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 Tonsillectomy  1940's  . Calcaneus hardware removal Right ~ 1994    History   Social History  . Marital Status: Married    Spouse Name: N/A    Number of Children: 2  . Years of Education: N/A   Occupational History  . Not on file.   Social History Main Topics  . Smoking status: Former Smoker -- 1.00 packs/day for 18 years    Types: Cigarettes    Quit date: 09/23/1977  . Smokeless tobacco: Never Used  . Alcohol Use: 8.4 oz/week    14 Cans of beer per week     Comment: 04/28/2013 "couple beers/day"  . Drug Use: No  . Sexual Activity: Yes   Other Topics Concern  . Not on file   Social History Narrative  . No narrative on file    ROS:  no fevers or chills, productive cough, hemoptysis, dysphasia, odynophagia, melena, hematochezia, dysuria, hematuria, rash, seizure activity, orthopnea, PND, pedal edema, claudication. Remaining systems are negative.  Physical Exam: Well-developed well-nourished in no acute distress.  Skin is warm and dry.  HEENT is normal.  Neck is supple.  Chest is clear to auscultation with normal expansion.  Cardiovascular exam is regular rate and rhythm.  Abdominal exam nontender or distended. No masses palpated. Extremities show no edema. neuro grossly intact

## 2013-09-30 NOTE — Interval H&P Note (Signed)
History and Physical Interval Note:  09/30/2013 10:57 AM  Francis Shaw  has presented today for cardiac cath with the diagnosis of cardiomyopathy. The various methods of treatment have been discussed with the patient and family. After consideration of risks, benefits and other options for treatment, the patient has consented to  Procedure(s): LEFT HEART CATHETERIZATION WITH CORONARY ANGIOGRAM (N/A) as a surgical intervention .  The patient's history has been reviewed, patient examined, no change in status, stable for surgery.  I have reviewed the patient's chart and labs.  Questions were answered to the patient's satisfaction.   Cath Lab Visit (complete for each Cath Lab visit)  Clinical Evaluation Leading to the Procedure:   ACS: no  Non-ACS:    Anginal Classification: No Symptoms  Anti-ischemic medical therapy: Minimal Therapy (1 class of medications)  Non-Invasive Test Results: No non-invasive testing performed  Prior CABG: No previous CABG          Tammye Kahler

## 2013-09-30 NOTE — CV Procedure (Signed)
      Cardiac Catheterization Operative Report  Francis Shaw [id number] 1/8/201511:26 AM Penni Homans, MD  Procedure Performed:  1. Left Heart Catheterization 2. Selective Coronary Angiography 3. Left ventricular angiogram  Operator: Lauree Chandler, MD  Arterial access site:  Right radial artery.   Indication: 73 yo male with history of cardiomyopathy, atrial flutter. Cardiac cath arranged by Dr. Stanford Breed today to exclude CAD as the cause of his LV systolic dysfunction.                                       Procedure Details: The risks, benefits, complications, treatment options, and expected outcomes were discussed with the patient. The patient and/or family concurred with the proposed plan, giving informed consent. The patient was brought to the cath lab after IV hydration was begun and oral premedication was given. The patient was further sedated with Versed and Fentanyl. The right wrist was assessed with an Allens test which was positive. The right wrist was prepped and draped in a sterile fashion. 1% lidocaine was used for local anesthesia. Using the modified Seldinger access technique, a 5 French sheath was placed in the right radial artery. 3 mg Verapamil was given through the sheath. 4000 units IV heparin was given. Standard diagnostic catheters were used to perform selective coronary angiography. A pigtail catheter was used to perform a left ventricular angiogram. The sheath was removed from the right radial artery and a Terumo hemostasis band was applied at the arteriotomy site on the right wrist.   There were no immediate complications. The patient was taken to the recovery area in stable condition.   Hemodynamic Findings: Central aortic pressure: 126/65 Left ventricular pressure: 125/6/10  Angiographic Findings:  Left main: No obstructive disease.   Left Anterior Descending Artery: Large caliber vessel that courses to the apex. The mid vessel has a 20% stenosis.  The diagonal branch is small in caliber, no obstructive disease.   Circumflex Artery: Large caliber vessel with moderate caliber obtuse marginal branch. No obstructive disease.   Right Coronary Artery: Large, dominant vessel with no obstructive disease.   Left Ventricular Angiogram: LVEF=35%. Anteroapical hypokinesis.   Impression: 1. Mild non-obstructive CAD 2. Moderate LV systolic dysfunction 3. Non-ischemic cardiomyopathy  Recommendations: Continue medical management of cardiomyopathy. Consider adding statin given mild CAD.        Complications:  None. The patient tolerated the procedure well.

## 2013-10-19 ENCOUNTER — Telehealth: Payer: Self-pay | Admitting: Family Medicine

## 2013-10-19 NOTE — Telephone Encounter (Signed)
Called insurance, awaiting prior auth forms to be faxed

## 2013-10-19 NOTE — Telephone Encounter (Signed)
PATIENT CHANGED INSURANCE TO [employer] MED[employer]ARE MEMBER ## [id number], GROUP [id number] HIS INSURANCE COMPANY SAYS HE NEEDS A PRIOR AUTHORIZATION FOR LOSARTIN 25MG  TAB CARVEDILOL 12.5 MG TABLET AND APIXABAN 5 MG TABLET

## 2013-10-29 NOTE — Telephone Encounter (Signed)
Patient is calling to check on progress of prior authorization for eloquis

## 2013-11-01 NOTE — Telephone Encounter (Signed)
Did you ever receive the PA forms for him?

## 2013-11-02 NOTE — Telephone Encounter (Signed)
Pt left message stating he is almost out of eliquis and wanted to know if Provider had discussed possible alternatives (?pradaxa, coumadin) with his cardiologist? What is status?

## 2013-11-03 ENCOUNTER — Telehealth: Payer: Self-pay | Admitting: Cardiology

## 2013-11-03 DIAGNOSIS — R030 Elevated blood-pressure reading, without diagnosis of hypertension: Principal | ICD-10-CM

## 2013-11-03 DIAGNOSIS — IMO0001 Reserved for inherently not codable concepts without codable children: Secondary | ICD-10-CM

## 2013-11-03 NOTE — Telephone Encounter (Signed)
Spoke with pt, aware will get dosage from dr Stanford Breed and call the pt back. Pt agreed with this plan.

## 2013-11-03 NOTE — Telephone Encounter (Signed)
Yet to get PA form from insurance, called insurance again. Still waiting.

## 2013-11-03 NOTE — Telephone Encounter (Signed)
New message    Ins will no longer pay for eliquis.  However, they will pay for prodaxa or xeralto.  Will it be ok to switch pt to one of these medications?

## 2013-11-04 MED ORDER — LOSARTAN POTASSIUM 25 MG PO TABS: 25.0000 mg | ORAL_TABLET | Freq: Every day | ORAL | Status: DC

## 2013-11-04 MED ORDER — CARVEDILOL 12.5 MG PO TABS: 12.5000 mg | ORAL_TABLET | Freq: Two times a day (BID) | ORAL | Status: DC

## 2013-11-04 MED ORDER — RIVAROXABAN 20 MG PO TABS: 20.0000 mg | ORAL_TABLET | Freq: Every day | ORAL | Status: DC

## 2013-11-04 NOTE — Telephone Encounter (Signed)
Dc apixaban, xarelto 20 mg daily Kirk Ruths

## 2013-11-04 NOTE — Telephone Encounter (Signed)
Spoke with pt, Aware of dr crenshaw's recommendations.  °

## 2013-12-08 NOTE — Telephone Encounter (Signed)
Lm on vm with patient, need to know if he still needs PA, insurance has yet to fax over form and they have been call twice.

## 2013-12-20 NOTE — Telephone Encounter (Signed)
Family states it is not needed.

## 2014-05-09 ENCOUNTER — Encounter: Payer: Self-pay | Admitting: Physician Assistant

## 2014-05-09 ENCOUNTER — Ambulatory Visit (INDEPENDENT_AMBULATORY_CARE_PROVIDER_SITE_OTHER): Payer: Medicare HMO | Admitting: Physician Assistant

## 2014-05-09 VITALS — BP 136/86 | HR 64 | Temp 98.3°F | Resp 16 | Ht 71.0 in | Wt 179.0 lb

## 2014-05-09 DIAGNOSIS — R140 Abdominal distension (gaseous): Secondary | ICD-10-CM

## 2014-05-09 DIAGNOSIS — R142 Eructation: Secondary | ICD-10-CM

## 2014-05-09 DIAGNOSIS — R143 Flatulence: Secondary | ICD-10-CM

## 2014-05-09 DIAGNOSIS — R141 Gas pain: Secondary | ICD-10-CM

## 2014-05-09 DIAGNOSIS — B029 Zoster without complications: Secondary | ICD-10-CM

## 2014-05-09 MED ORDER — HYDROCODONE-ACETAMINOPHEN 10-325 MG PO TABS: 1.0000 | ORAL_TABLET | Freq: Three times a day (TID) | ORAL | Status: DC | PRN

## 2014-05-09 MED ORDER — VALACYCLOVIR HCL 1 G PO TABS: 1000.0000 mg | ORAL_TABLET | Freq: Three times a day (TID) | ORAL | Status: DC

## 2014-05-09 NOTE — Progress Notes (Signed)
Pre visit review using our clinic review tool, if applicable. No additional management support is needed unless otherwise documented below in the visit note/SLS  

## 2014-05-09 NOTE — Patient Instructions (Signed)
Please obtain labs. I will call you with your results.  Please increase fluid intake and take a daily probiotic.  I would hold-off on imaging for now as the pain is likely stemming from the Shingles rash.  Please take the Valtrex as directed.  Take Norco as needed for significant pain.  If the bloating persists, then we will get an Ultrasound of the abdomen.

## 2014-05-09 NOTE — Progress Notes (Signed)
Patient presents to clinic today c/o pain of the right side of his abdomen over the past week.  Pain has worsened over the past couple of days and is now extending to his right back.  No pain of left side.  Patient now endorses a blistering rash at site of pain. Has had chicken pox before.  Recently received the Zostavax vaccination. Patient does endorse some mild bloating and increased flatulence over the past week.  Past Medical History  Diagnosis Date  . Skin lesion 11/26/2011  . Dermatitis 11/22/2012  . Esophageal reflux 04/12/2013  . Pericarditis   . Atrial flutter   . Atrial fibrillation     a. Eliquis initiated 04/2013.  Marland Kitchen Nonischemic cardiomyopathy     a. presumed to be tachy mediated;  b. 03/2013 Echo: EF 30-35%, diff HK, mild LVH, mildly dil LA.    Current Outpatient Prescriptions on File Prior to Visit  Medication Sig Dispense Refill  . acetaminophen (TYLENOL) 500 MG tablet Take 1,000 mg by mouth every 6 (six) hours as needed for mild pain or headache.      . carvedilol (COREG) 12.5 MG tablet Take 1 tablet (12.5 mg total) by mouth 2 (two) times daily with a meal.  60 tablet  6  . clobetasol cream (TEMOVATE) 4.78 % Apply 1 application topically 2 (two) times daily as needed (anal itch). prn      . furosemide (LASIX) 20 MG tablet Take 20 mg by mouth daily as needed for fluid. prn      . guaiFENesin (MUCINEX) 600 MG 12 hr tablet Take 600 mg by mouth daily as needed.       Marland Kitchen KRILL OIL PO Take 1 capsule by mouth daily.      Marland Kitchen losartan (COZAAR) 25 MG tablet Take 1 tablet (25 mg total) by mouth daily.  30 tablet  6  . Multiple Vitamins-Minerals (CENTRUM SILVER PO) Take 1 tablet by mouth daily.      . Rivaroxaban (XARELTO) 20 MG TABS tablet Take 1 tablet (20 mg total) by mouth daily with supper.  30 tablet  6   No current facility-administered medications on file prior to visit.    No Known Allergies  Family History  Problem Relation Age of Onset  . Stroke Father   . Asthma Mother    low back pain    History   Social History  . Marital Status: Married    Spouse Name: N/A    Number of Children: 2  . Years of Education: N/A   Social History Main Topics  . Smoking status: Former Smoker -- 1.00 packs/day for 18 years    Types: Cigarettes    Quit date: 09/23/1977  . Smokeless tobacco: Never Used  . Alcohol Use: 8.4 oz/week    14 Cans of beer per week     Comment: 04/28/2013 "couple beers/day"  . Drug Use: No  . Sexual Activity: Yes   Other Topics Concern  . None   Social History Narrative  . None    Review of Systems - See HPI.  All other ROS are negative.  BP 136/86  Pulse 64  Temp(Src) 98.3 F (36.8 C) (Oral)  Resp 16  Ht 5\' 11"  (1.803 m)  Wt 179 lb (81.194 kg)  BMI 24.98 kg/m2  SpO2 97%  Physical Exam  Vitals reviewed. Constitutional: He is oriented to person, place, and time and well-developed, well-nourished, and in no distress.  HENT:  Head: Normocephalic and atraumatic.  Eyes: Conjunctivae  are normal.  Neck: Neck supple.  Cardiovascular: Normal rate, regular rhythm, normal heart sounds and intact distal pulses.   Pulmonary/Chest: Effort normal and breath sounds normal. No respiratory distress. He has no wheezes. He has no rales. He exhibits no tenderness.  Abdominal: He exhibits no distension and no mass. There is no rebound and no guarding.  Tenderness with palpation of abdomen at location of rash.  Neurological: He is alert and oriented to person, place, and time.  Skin: Skin is warm and dry.  Presence of a blistering rash of right abdomen reaching around to right side of back, following a dermatomal distribution. Rash does not cross the midline.  Psychiatric: Affect normal.   Assessment/Plan: Shingles Rx Valtrex. Rx Norco for pain.  Wear loose fitting clothing.  Discuss contagious nature of virus and precautions to take.  Discussed possibility of postherpetic neuralgia from delayed treatment of shingles.  Return precautions discussed  with patient.  Abdominal bloating Will obtain basic labs.  Increase fluids. Probiotic.

## 2014-05-10 DIAGNOSIS — R140 Abdominal distension (gaseous): Secondary | ICD-10-CM | POA: Insufficient documentation

## 2014-05-10 DIAGNOSIS — B029 Zoster without complications: Secondary | ICD-10-CM | POA: Insufficient documentation

## 2014-05-10 LAB — BASIC METABOLIC PANEL
BUN: 14 mg/dL (ref 6–23)
CO2: 29 mEq/L (ref 19–32)
CREATININE: 0.68 mg/dL (ref 0.50–1.35)
Calcium: 8.9 mg/dL (ref 8.4–10.5)
Chloride: 106 mEq/L (ref 96–112)
Glucose, Bld: 97 mg/dL (ref 70–99)
Potassium: 5.1 mEq/L (ref 3.5–5.3)
Sodium: 140 mEq/L (ref 135–145)

## 2014-05-10 LAB — CBC
HCT: 42 % (ref 39.0–52.0)
Hemoglobin: 14.9 g/dL (ref 13.0–17.0)
MCH: 30.3 pg (ref 26.0–34.0)
MCHC: 35.5 g/dL (ref 30.0–36.0)
MCV: 85.4 fL (ref 78.0–100.0)
PLATELETS: 185 10*3/uL (ref 150–400)
RBC: 4.92 MIL/uL (ref 4.22–5.81)
RDW: 13.2 % (ref 11.5–15.5)
WBC: 5.8 10*3/uL (ref 4.0–10.5)

## 2014-05-10 NOTE — Assessment & Plan Note (Signed)
Rx Valtrex. Rx Norco for pain.  Wear loose fitting clothing.  Discuss contagious nature of virus and precautions to take.  Discussed possibility of postherpetic neuralgia from delayed treatment of shingles.  Return precautions discussed with patient.

## 2014-05-10 NOTE — Assessment & Plan Note (Signed)
Will obtain basic labs.  Increase fluids. Probiotic.

## 2014-05-11 ENCOUNTER — Encounter: Payer: Self-pay | Admitting: Family Medicine

## 2014-05-13 ENCOUNTER — Other Ambulatory Visit: Payer: Self-pay | Admitting: Physician Assistant

## 2014-05-13 DIAGNOSIS — B029 Zoster without complications: Secondary | ICD-10-CM

## 2014-05-13 MED ORDER — LIDOCAINE 5 % EX OINT: 1.0000 "application " | TOPICAL_OINTMENT | CUTANEOUS | Status: DC | PRN

## 2014-05-13 NOTE — Telephone Encounter (Signed)
Please Advise

## 2014-05-29 ENCOUNTER — Other Ambulatory Visit: Payer: Self-pay | Admitting: Cardiology

## 2014-06-01 ENCOUNTER — Other Ambulatory Visit: Payer: Self-pay

## 2014-06-01 MED ORDER — LOSARTAN POTASSIUM 25 MG PO TABS: 25.0000 mg | ORAL_TABLET | Freq: Every day | ORAL | Status: DC

## 2014-06-28 ENCOUNTER — Other Ambulatory Visit: Payer: Self-pay | Admitting: Cardiology

## 2014-06-29 ENCOUNTER — Other Ambulatory Visit: Payer: Self-pay | Admitting: Cardiology

## 2014-07-04 ENCOUNTER — Other Ambulatory Visit: Payer: Self-pay | Admitting: Cardiology

## 2014-07-29 ENCOUNTER — Other Ambulatory Visit: Payer: Self-pay

## 2014-07-29 MED ORDER — LOSARTAN POTASSIUM 25 MG PO TABS
ORAL_TABLET | ORAL | Status: DC
Start: 2014-07-29 — End: 2014-11-22

## 2014-07-29 MED ORDER — CARVEDILOL 12.5 MG PO TABS: ORAL_TABLET | ORAL | Status: DC

## 2014-08-25 ENCOUNTER — Other Ambulatory Visit: Payer: Self-pay | Admitting: Cardiology

## 2014-09-01 ENCOUNTER — Encounter (HOSPITAL_COMMUNITY): Payer: Self-pay | Admitting: Cardiovascular Disease

## 2014-09-21 ENCOUNTER — Other Ambulatory Visit: Payer: Self-pay | Admitting: Cardiology

## 2014-09-25 ENCOUNTER — Other Ambulatory Visit: Payer: Self-pay | Admitting: Cardiology

## 2014-09-26 ENCOUNTER — Encounter: Payer: Self-pay | Admitting: Family Medicine

## 2014-10-06 ENCOUNTER — Encounter (HOSPITAL_COMMUNITY): Payer: Self-pay | Admitting: Cardiology

## 2014-10-19 ENCOUNTER — Encounter: Payer: Medicare HMO | Admitting: Cardiology

## 2014-10-19 NOTE — Progress Notes (Signed)
HPI: FU atrial fibrillation. Echocardiogram in July of 2014 showed moderate left ventricular enlargement, mild left ventricular hypertrophy and an ejection fraction of 30-35% with diffuse hypokinesis. There was mild left atrial enlargement. TSH in July of 2014 normal. When I initially saw the patient in August of 2014 he was in atrial flutter at a rate of 150. He was admitted to telemetry and placed on rate controlling medications. He was noted to have intermittent atrial fibrillation as well. It was therefore felt atrial flutter ablation was not indicated. The plan was for cardioversion once he had been anticoagulated for 4 consecutive weeks. Echocardiogram would be repeated once sinus reestablished to make sure that LV function had improved. Otherwise he would need ischemia evaluation. Patient was scheduled for elective cardioversion but when he came to the procedure he was in sinus rhythm. Echocardiogram repeated in November of 2014. Ejection fraction 20-25% with grade 1 diastolic dysfunction. Mild left atrial enlargement. Cardiac catheterization was performed on 09/30/2013. There was no obstructive disease and the ejection fraction was 35%. Since he was last seen, he denies dyspnea, chest pain or syncope. No bleeding.  Current Outpatient Prescriptions  Medication Sig Dispense Refill  . acetaminophen (TYLENOL) 500 MG tablet Take 1,000 mg by mouth every 6 (six) hours as needed for mild pain or headache.    . carvedilol (COREG) 12.5 MG tablet TAKE 1 TABLET BY MOUTH TWICE DAILY WITH A MEAL 60 tablet 6  . clobetasol cream (TEMOVATE) 3.38 % Apply 1 application topically 2 (two) times daily as needed (anal itch). prn    . furosemide (LASIX) 20 MG tablet Take 20 mg by mouth daily as needed for fluid. prn    . guaiFENesin (MUCINEX) 600 MG 12 hr tablet Take 600 mg by mouth daily as needed.     Marland Kitchen HYDROcodone-acetaminophen (NORCO) 10-325 MG per tablet Take 1 tablet by mouth every 8 (eight) hours as  needed. 40 tablet 0  . KRILL OIL PO Take 1 capsule by mouth daily.    Marland Kitchen lidocaine (XYLOCAINE) 5 % ointment Apply 1 application topically as needed. 35.44 g 0  . losartan (COZAAR) 25 MG tablet TAKE 1 TABLET BY MOUTH EVERY DAY 30 tablet 1  . losartan (COZAAR) 25 MG tablet TAKE 1 TABLET BY MOUTH EVERY DAY 30 tablet 0  . Multiple Vitamins-Minerals (CENTRUM SILVER PO) Take 1 tablet by mouth daily.    . rivaroxaban (XARELTO) 20 MG TABS tablet Take 1 tablet by mouth daily with supper.  Need MD visit for further refills 30 tablet 0  . valACYclovir (VALTREX) 1000 MG tablet Take 1 tablet (1,000 mg total) by mouth 3 (three) times daily. 21 tablet 0   No current facility-administered medications for this visit.     Past Medical History  Diagnosis Date  . Skin lesion 11/26/2011  . Dermatitis 11/22/2012  . Esophageal reflux 04/12/2013  . Pericarditis   . Atrial flutter   . Atrial fibrillation     a. Eliquis initiated 04/2013.  Marland Kitchen Nonischemic cardiomyopathy     a. presumed to be tachy mediated;  b. 03/2013 Echo: EF 30-35%, diff HK, mild LVH, mildly dil LA.    Past Surgical History  Procedure Laterality Date  . Calcaneal osteotomy Right 1993    "stapled it; screwed it" (04/28/2013)  . 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 Tonsillectomy  1940's  . Calcaneus hardware removal Right ~ 1994  . Left heart catheterization with coronary angiogram N/A 09/30/2013  Procedure: LEFT HEART CATHETERIZATION WITH CORONARY ANGIOGRAM;  Surgeon: Burnell Blanks, MD;  Location: [facility] CATH LAB;  Service: Cardiovascular;  Laterality: N/A;    History   Social History  . Marital Status: Married    Spouse Name: N/A    Number of Children: 2  . Years of Education: N/A   Occupational History  . Not on file.   Social History Main Topics  . Smoking status: Former Smoker -- 1.00 packs/day for 18 years    Types: Cigarettes    Quit date: 09/23/1977  . Smokeless tobacco: Never Used  . Alcohol Use: 8.4  oz/week    14 Cans of beer per week     Comment: 04/28/2013 "couple beers/day"  . Drug Use: No  . Sexual Activity: Yes   Other Topics Concern  . Not on file   Social History Narrative    ROS: no fevers or chills, productive cough, hemoptysis, dysphasia, odynophagia, melena, hematochezia, dysuria, hematuria, rash, seizure activity, orthopnea, PND, pedal edema, claudication. Remaining systems are negative.  Physical Exam: Well-developed well-nourished in no acute distress.  Skin is warm and dry.  HEENT is normal.  Neck is supple.  Chest is clear to auscultation with normal expansion.  Cardiovascular exam is regular rate and rhythm.  Abdominal exam nontender or distended. No masses palpated. Extremities show no edema. neuro grossly intact  ECG     This encounter was created in error - please disregard.

## 2014-10-22 ENCOUNTER — Other Ambulatory Visit: Payer: Self-pay | Admitting: Cardiology

## 2014-10-24 NOTE — Telephone Encounter (Signed)
Rx refill sent to patient pharmacy   

## 2014-10-25 ENCOUNTER — Other Ambulatory Visit: Payer: Self-pay | Admitting: Cardiology

## 2014-11-01 NOTE — Progress Notes (Signed)
HPI: FU atrial fibrillation. Echocardiogram in July of 2014 showed moderate left ventricular enlargement, mild left ventricular hypertrophy and an ejection fraction of 30-35% with diffuse hypokinesis. There was mild left atrial enlargement. TSH in July of 2014 normal. When I initially saw the patient in August of 2014 he was in atrial flutter at a rate of 150. He was admitted to telemetry and placed on rate controlling medications. He was noted to have intermittent atrial fibrillation as well. It was therefore felt atrial flutter ablation was not indicated. The plan was cardioversion once he had been anticoagulated for 4 consecutive weeks. Echocardiogram would be repeated once sinus reestablished to make sure that LV function had improved. Otherwise he would need ischemia evaluation. Patient was scheduled for elective cardioversion but when he came to the procedure he was in sinus rhythm. Echocardiogram repeated in November of 2014. Ejection fraction 20-25% with grade 1 diastolic dysfunction. Mild left atrial enlargement. Cardiac catheterization January 2015 showed no obstructive coronary disease and ejection fraction 35%. Since he was last seen, he denies dyspnea, chest pain or syncope. No bleeding.  Current Outpatient Prescriptions  Medication Sig Dispense Refill  . acetaminophen (TYLENOL) 500 MG tablet Take 1,000 mg by mouth every 6 (six) hours as needed for mild pain or headache.    . carvedilol (COREG) 12.5 MG tablet TAKE 1 TABLET BY MOUTH TWICE DAILY WITH A MEAL 60 tablet 6  . clobetasol cream (TEMOVATE) 2.50 % Apply 1 application topically 2 (two) times daily as needed (anal itch). prn    . furosemide (LASIX) 20 MG tablet Take 20 mg by mouth daily as needed for fluid. prn    . guaiFENesin (MUCINEX) 600 MG 12 hr tablet Take 600 mg by mouth daily as needed for to loosen phlegm.     Marland Kitchen HYDROcodone-acetaminophen (NORCO) 10-325 MG per tablet Take 1 tablet by mouth every 8 (eight) hours as needed.  (Patient taking differently: Take 1 tablet by mouth every 8 (eight) hours as needed (pain). ) 40 tablet 0  . losartan (COZAAR) 25 MG tablet TAKE 1 TABLET BY MOUTH EVERY DAY 30 tablet 1  . Multiple Vitamins-Minerals (CENTRUM SILVER PO) Take 1 tablet by mouth daily.    Alveda Reasons 20 MG TABS tablet TAKE 1 TABLET BY MOUTH EVERY DAY WITH SUPPER 30 tablet 0   No current facility-administered medications for this visit.     Past Medical History  Diagnosis Date  . Skin lesion 11/26/2011  . Dermatitis 11/22/2012  . Esophageal reflux 04/12/2013  . Pericarditis   . Atrial flutter   . Atrial fibrillation     a. Eliquis initiated 04/2013.  Marland Kitchen Nonischemic cardiomyopathy     a. presumed to be tachy mediated;  b. 03/2013 Echo: EF 30-35%, diff HK, mild LVH, mildly dil LA.    Past Surgical History  Procedure Laterality Date  . Calcaneal osteotomy Right 1993    "stapled it; screwed it" (04/28/2013)  . 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 Tonsillectomy  1940's  . Calcaneus hardware removal Right ~ 1994  . Left heart catheterization with coronary angiogram N/A 09/30/2013    Procedure: LEFT HEART CATHETERIZATION WITH CORONARY ANGIOGRAM;  Surgeon: Burnell Blanks, MD;  Location: [facility] CATH LAB;  Service: Cardiovascular;  Laterality: N/A;    History   Social History  . Marital Status: Married    Spouse Name: N/A  . Number of Children: 2  . Years of Education: N/A  Occupational History  . Not on file.   Social History Main Topics  . Smoking status: Former Smoker -- 1.00 packs/day for 18 years    Types: Cigarettes    Quit date: 09/23/1977  . Smokeless tobacco: Never Used  . Alcohol Use: 8.4 oz/week    14 Cans of beer per week     Comment: 04/28/2013 "couple beers/day"  . Drug Use: No  . Sexual Activity: Yes   Other Topics Concern  . Not on file   Social History Narrative    ROS: no fevers or chills, productive cough, hemoptysis, dysphasia, odynophagia, melena, hematochezia,  dysuria, hematuria, rash, seizure activity, orthopnea, PND, pedal edema, claudication. Remaining systems are negative.  Physical Exam: Well-developed well-nourished in no acute distress.  Skin is warm and dry.  HEENT is normal.  Neck is supple.  Chest is clear to auscultation with normal expansion.  Cardiovascular exam is regular rate and rhythm.  Abdominal exam nontender or distended. No masses palpated. Extremities show no edema. neuro grossly intact  ECG sinus rhythm, left bundle branch block.

## 2014-11-02 ENCOUNTER — Ambulatory Visit (INDEPENDENT_AMBULATORY_CARE_PROVIDER_SITE_OTHER): Payer: Medicare HMO | Admitting: Cardiology

## 2014-11-02 ENCOUNTER — Encounter: Payer: Self-pay | Admitting: Cardiology

## 2014-11-02 VITALS — BP 128/78 | HR 68 | Ht 71.0 in | Wt 182.0 lb

## 2014-11-02 DIAGNOSIS — R0989 Other specified symptoms and signs involving the circulatory and respiratory systems: Secondary | ICD-10-CM

## 2014-11-02 DIAGNOSIS — I420 Dilated cardiomyopathy: Secondary | ICD-10-CM

## 2014-11-02 DIAGNOSIS — I4891 Unspecified atrial fibrillation: Secondary | ICD-10-CM

## 2014-11-02 LAB — CBC
HCT: 44 % (ref 39.0–52.0)
Hemoglobin: 15.4 g/dL (ref 13.0–17.0)
MCH: 30.6 pg (ref 26.0–34.0)
MCHC: 35 g/dL (ref 30.0–36.0)
MCV: 87.3 fL (ref 78.0–100.0)
MPV: 10.5 fL (ref 8.6–12.4)
Platelets: 201 10*3/uL (ref 150–400)
RBC: 5.04 MIL/uL (ref 4.22–5.81)
RDW: 12.8 % (ref 11.5–15.5)
WBC: 6.9 10*3/uL (ref 4.0–10.5)

## 2014-11-02 LAB — BASIC METABOLIC PANEL WITH GFR
BUN: 14 mg/dL (ref 6–23)
CALCIUM: 9 mg/dL (ref 8.4–10.5)
CHLORIDE: 103 meq/L (ref 96–112)
CO2: 28 meq/L (ref 19–32)
Creat: 0.74 mg/dL (ref 0.50–1.35)
GFR, Est African American: >89 mL/min
GLUCOSE: 92 mg/dL (ref 70–99)
POTASSIUM: 4.8 meq/L (ref 3.5–5.3)
Sodium: 139 mEq/L (ref 135–145)

## 2014-11-02 NOTE — Assessment & Plan Note (Signed)
Patient in sinus rhythm today. Continue beta blocker. Continue xarelto. Check hemoglobin and renal function.

## 2014-11-02 NOTE — Assessment & Plan Note (Addendum)
History of nonischemic cardio myopathy. Continue beta blocker and ARB. Repeat echocardiogram. Note he is class I.

## 2014-11-02 NOTE — Assessment & Plan Note (Signed)
Scheduled ultrasound to exclude aneurysm. 

## 2014-11-02 NOTE — Patient Instructions (Signed)
Your physician wants you to follow-up in: [place] will receive a reminder letter in the mail two months in advance. If you don't receive a letter, please call our office to schedule the follow-up appointment.   Your physician has requested that you have an echocardiogram. Echocardiography is a painless test that uses sound waves to create images of your heart. It provides your doctor with information about the size and shape of your heart and how well your heart's chambers and valves are working. This procedure takes approximately one hour. There are no restrictions for this procedure.   Your physician has requested that you have an abdominal aorta duplex. During this test, an ultrasound is used to evaluate the aorta. Allow 30 minutes for this exam. Do not eat after midnight the day before and avoid carbonated beverages   Your physician recommends that you HAVE [facility]  Your physician recommends that you return for lab work in: [place]

## 2014-11-02 NOTE — Assessment & Plan Note (Signed)
We did not pursue ablation previously as he had noted of atrial fibrillation as well.

## 2014-11-16 ENCOUNTER — Ambulatory Visit (HOSPITAL_BASED_OUTPATIENT_CLINIC_OR_DEPARTMENT_OTHER)
Admission: RE | Admit: 2014-11-16 | Discharge: 2014-11-16 | Disposition: A | Discharge status: Home / Self Care | Payer: Medicare HMO | Source: Ambulatory Visit | Attending: Cardiology | Admitting: Cardiology

## 2014-11-16 DIAGNOSIS — R0989 Other specified symptoms and signs involving the circulatory and respiratory systems: Secondary | ICD-10-CM | POA: Diagnosis not present

## 2014-11-16 DIAGNOSIS — I429 Cardiomyopathy, unspecified: Secondary | ICD-10-CM | POA: Diagnosis not present

## 2014-11-16 DIAGNOSIS — I420 Dilated cardiomyopathy: Secondary | ICD-10-CM

## 2014-11-16 DIAGNOSIS — I425 Other restrictive cardiomyopathy: Secondary | ICD-10-CM

## 2014-11-16 NOTE — Progress Notes (Signed)
Echocardiogram 2D Echocardiogram has been performed.  Francis Shaw 11/16/2014, 11:20 AM

## 2014-11-22 ENCOUNTER — Other Ambulatory Visit: Payer: Self-pay | Admitting: Cardiology

## 2014-12-06 ENCOUNTER — Ambulatory Visit (HOSPITAL_BASED_OUTPATIENT_CLINIC_OR_DEPARTMENT_OTHER)
Admission: RE | Admit: 2014-12-06 | Discharge: 2014-12-06 | Disposition: A | Discharge status: Home / Self Care | Payer: Medicare HMO | Source: Ambulatory Visit | Attending: Medical | Admitting: Medical

## 2014-12-06 ENCOUNTER — Ambulatory Visit (INDEPENDENT_AMBULATORY_CARE_PROVIDER_SITE_OTHER): Payer: Medicare HMO | Admitting: Medical

## 2014-12-06 ENCOUNTER — Encounter: Payer: Self-pay | Admitting: Medical

## 2014-12-06 VITALS — BP 133/71 | HR 73 | Temp 97.6°F | Ht 71.0 in | Wt 181.8 lb

## 2014-12-06 DIAGNOSIS — I4892 Unspecified atrial flutter: Secondary | ICD-10-CM | POA: Insufficient documentation

## 2014-12-06 DIAGNOSIS — J449 Chronic obstructive pulmonary disease, unspecified: Secondary | ICD-10-CM | POA: Insufficient documentation

## 2014-12-06 DIAGNOSIS — J301 Allergic rhinitis due to pollen: Secondary | ICD-10-CM | POA: Diagnosis not present

## 2014-12-06 DIAGNOSIS — Z87891 Personal history of nicotine dependence: Secondary | ICD-10-CM | POA: Diagnosis not present

## 2014-12-06 DIAGNOSIS — J209 Acute bronchitis, unspecified: Secondary | ICD-10-CM

## 2014-12-06 DIAGNOSIS — I429 Cardiomyopathy, unspecified: Secondary | ICD-10-CM | POA: Insufficient documentation

## 2014-12-06 DIAGNOSIS — R05 Cough: Secondary | ICD-10-CM | POA: Diagnosis present

## 2014-12-06 DIAGNOSIS — J309 Allergic rhinitis, unspecified: Secondary | ICD-10-CM | POA: Insufficient documentation

## 2014-12-06 MED ORDER — HYDROCODONE-HOMATROPINE 5-1.5 MG/5ML PO SYRP: 5.0000 mL | ORAL_SOLUTION | Freq: Four times a day (QID) | ORAL | Status: DC | PRN

## 2014-12-06 MED ORDER — FLUTICASONE PROPIONATE 50 MCG/ACT NA SUSP: 2.0000 | Freq: Every day | NASAL | Status: DC

## 2014-12-06 MED ORDER — AZITHROMYCIN 250 MG PO TABS: ORAL_TABLET | ORAL | Status: DC

## 2014-12-06 MED ORDER — LORATADINE 10 MG PO TABS
10.0000 mg | ORAL_TABLET | Freq: Every day | ORAL | Status: DC
Start: 2014-12-06 — End: 2015-05-31

## 2014-12-06 NOTE — Progress Notes (Signed)
Subjective:    Patient ID: WESS BANEY, male    DOB: Oct 20, 1940, 74 y.o.   MRN: [id number]  HPI  Pt in with dry cough. This has been persistent. Some pnd. Faint sinus pressure early on. No sneezing or itchy eyes. Non smoker. No hx of asthma. No wheezing. No fever, no chills or sweats.  Pt has some occasional spring allergies. But brief and mild usually.   Review of Systems  Constitutional: Negative for fever, chills and fatigue.  HENT: Positive for postnasal drip. Negative for congestion, ear pain, rhinorrhea, sneezing, tinnitus, trouble swallowing and voice change.   Respiratory: Positive for cough. Negative for choking, chest tightness, shortness of breath and wheezing.   Cardiovascular: Negative for chest pain and palpitations.  Gastrointestinal: Negative for abdominal pain.  Musculoskeletal: Negative for myalgias and back pain.  Skin: Negative for pallor.  Neurological: Negative for dizziness, tremors, syncope, facial asymmetry, speech difficulty, light-headedness and numbness.  Hematological: Negative for adenopathy. Does not bruise/bleed easily.  Psychiatric/Behavioral: Negative for behavioral problems, confusion and decreased concentration.   Past Medical History  Diagnosis Date  . Skin lesion 11/26/2011  . Dermatitis 11/22/2012  . Esophageal reflux 04/12/2013  . Pericarditis   . Atrial flutter   . Atrial fibrillation     a. Eliquis initiated 04/2013.  Marland Kitchen Nonischemic cardiomyopathy     a. presumed to be tachy mediated;  b. 03/2013 Echo: EF 30-35%, diff HK, mild LVH, mildly dil LA.    History   Social History  . Marital Status: Married    Spouse Name: N/A  . Number of Children: 2  . Years of Education: N/A   Occupational History  . Not on file.   Social History Main Topics  . Smoking status: Former Smoker -- 1.00 packs/day for 18 years    Types: Cigarettes    Quit date: 09/23/1977  . Smokeless tobacco: Never Used  . Alcohol Use: 8.4 oz/week    14 Cans of beer  per week     Comment: 04/28/2013 "couple beers/day"  . Drug Use: No  . Sexual Activity: Yes   Other Topics Concern  . Not on file   Social History Narrative    Past Surgical History  Procedure Laterality Date  . Calcaneal osteotomy Right 1993    "stapled it; screwed it" (04/28/2013)  . 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 Tonsillectomy  1940's  . Calcaneus hardware removal Right ~ 1994  . Left heart catheterization with coronary angiogram N/A 09/30/2013    Procedure: LEFT HEART CATHETERIZATION WITH CORONARY ANGIOGRAM;  Surgeon: Burnell Blanks, MD;  Location: [facility] CATH LAB;  Service: Cardiovascular;  Laterality: N/A;    Family History  Problem Relation Age of Onset  . Stroke Father   . Asthma Mother     low back pain    No Known Allergies  Current Outpatient Prescriptions on File Prior to Visit  Medication Sig Dispense Refill  . acetaminophen (TYLENOL) 500 MG tablet Take 1,000 mg by mouth every 6 (six) hours as needed for mild pain or headache.    . carvedilol (COREG) 12.5 MG tablet TAKE 1 TABLET BY MOUTH TWICE DAILY WITH A MEAL 60 tablet 6  . clobetasol cream (TEMOVATE) 3.15 % Apply 1 application topically 2 (two) times daily as needed (anal itch). prn    . guaiFENesin (MUCINEX) 600 MG 12 hr tablet Take 600 mg by mouth daily as needed for to loosen phlegm.     Marland Kitchen  losartan (COZAAR) 25 MG tablet TAKE 1 TABLET BY MOUTH EVERY DAY 30 tablet 6  . Multiple Vitamins-Minerals (CENTRUM SILVER PO) Take 1 tablet by mouth daily.    Alveda Reasons 20 MG TABS tablet TAKE 1 TABLET BY MOUTH EVERY DAY WITH SUPPER 30 tablet 0   No current facility-administered medications on file prior to visit.    BP 133/71 mmHg  Pulse 73  Temp(Src) 97.6 F (36.4 C) (Oral)  Ht 5\' 11"  (1.803 m)  Wt 181 lb 12.8 oz (82.464 kg)  BMI 25.37 kg/m2  SpO2 95%       Objective:   Physical Exam  General  Mental Status - Alert. General Appearance - Well groomed. Not in acute  distress.  Skin Rashes- No Rashes.  HEENT Head- Normal. Ear Auditory Canal - Left- Normal. Right - Normal.Tympanic Membrane- Left- Normal. Right- Normal. Eye Sclera/Conjunctiva- Left- Normal. Right- Normal. Nose & Sinuses Nasal Mucosa- Left-   boggy and Congested. Right-  boggy and  Congested.No bilateral maxillary and  No frontal sinus pressure. Mouth & Throat Lips: Upper Lip- Normal: no dryness, cracking, pallor, cyanosis, or vesicular eruption. Lower Lip-Normal: no dryness, cracking, pallor, cyanosis or vesicular eruption. Buccal Mucosa- Bilateral- No Aphthous ulcers. Oropharynx- No Discharge or Erythema. +pnd Tonsils: Characteristics- Bilateral- No Erythema or Congestion. Size/Enlargement- Bilateral- No enlargement. Discharge- bilateral-None.  Neck Neck- Supple. No Masses.   Chest and Lung Exam Auscultation: Breath Sounds:-Clear even and unlabored.  Cardiovascular Auscultation:Rythm- Regular, rate and rhythm. Murmurs & Other Heart Sounds:Ausculatation of the heart reveal- No Murmurs.  Lymphatic Head & Neck General Head & Neck Lymphatics: Bilateral: Description- No Localized lymphadenopathy.       Assessment & Plan:

## 2014-12-06 NOTE — Assessment & Plan Note (Signed)
Some post nasal drainage by exam and this is likely causing cough. Rx hydromet cough syrup, flonase nasal spray and claritin.

## 2014-12-06 NOTE — Progress Notes (Signed)
Pre visit review using our clinic review tool, if applicable. No additional management support is needed unless otherwise documented below in the visit note. 

## 2014-12-06 NOTE — Patient Instructions (Signed)
Allergic rhinitis Some post nasal drainage by exam and this is likely causing cough. Rx hydromet cough syrup, flonase nasal spray and claritin.   Acute bronchitis Cough for 3 wks and would like to get cxr to rule out any walking pneumonia. Rx azithromycin if symptoms persist or worsen. Or if sinus pressure or ear pain occurs.    Follow up 7-10 days or as needed

## 2014-12-06 NOTE — Assessment & Plan Note (Signed)
Cough for 3 wks and would like to get cxr to rule out any walking pneumonia. Rx azithromycin if symptoms persist or worsen. Or if sinus pressure or ear pain occurs.

## 2014-12-13 ENCOUNTER — Other Ambulatory Visit: Payer: Self-pay | Admitting: Medical

## 2014-12-15 ENCOUNTER — Other Ambulatory Visit: Payer: Self-pay

## 2014-12-15 MED ORDER — AZITHROMYCIN 250 MG PO TABS
ORAL_TABLET | ORAL | Status: DC
Start: 2014-12-15 — End: 2015-05-01

## 2014-12-15 MED ORDER — HYDROCODONE-HOMATROPINE 5-1.5 MG/5ML PO SYRP: 5.0000 mL | ORAL_SOLUTION | Freq: Four times a day (QID) | ORAL | Status: DC | PRN

## 2014-12-15 NOTE — Telephone Encounter (Signed)
He can have a refill one time on the Hydrocodone cough syrup 120 ml but not on antibiotic if he is still that ill he needs to be seen

## 2014-12-19 ENCOUNTER — Encounter: Payer: Self-pay | Admitting: *Deleted

## 2014-12-23 ENCOUNTER — Other Ambulatory Visit: Payer: Self-pay

## 2014-12-27 ENCOUNTER — Other Ambulatory Visit: Payer: Self-pay | Admitting: Cardiology

## 2014-12-29 NOTE — Telephone Encounter (Signed)
If he is still sick he should be seen.

## 2015-01-02 ENCOUNTER — Telehealth: Payer: Self-pay | Admitting: Medical

## 2015-01-02 NOTE — Telephone Encounter (Signed)
Patient called states he has received medication refill.

## 2015-01-02 NOTE — Telephone Encounter (Signed)
Follow up on rx request Get lpn to call and ask him to come in.

## 2015-01-31 ENCOUNTER — Other Ambulatory Visit: Payer: Self-pay | Admitting: Medical

## 2015-03-01 ENCOUNTER — Other Ambulatory Visit: Payer: Self-pay | Admitting: Cardiology

## 2015-03-01 NOTE — Telephone Encounter (Signed)
Rx(s) sent to pharmacy electronically.  

## 2015-03-30 ENCOUNTER — Other Ambulatory Visit: Payer: Self-pay | Admitting: Cardiology

## 2015-03-30 NOTE — Telephone Encounter (Signed)
Patient request 90 day supply on 03/01/15 Medication refilled at that time

## 2015-04-03 ENCOUNTER — Telehealth: Payer: Self-pay

## 2015-04-03 NOTE — Telephone Encounter (Signed)
Appointment scheduled 05/01/15 @ 10:15 am.

## 2015-05-01 ENCOUNTER — Ambulatory Visit (INDEPENDENT_AMBULATORY_CARE_PROVIDER_SITE_OTHER): Payer: Medicare HMO

## 2015-05-01 DIAGNOSIS — Z23 Encounter for immunization: Secondary | ICD-10-CM | POA: Diagnosis not present

## 2015-05-01 DIAGNOSIS — Z0000 Encounter for general adult medical examination without abnormal findings: Secondary | ICD-10-CM

## 2015-05-01 NOTE — Patient Instructions (Addendum)
Schedule eye exam.  Look into when you last had a colonoscopy.  Obtain records if possible.    Return in 6 months for follow up with PCP.    Colonoscopy A colonoscopy is an exam to look at the entire large intestine (colon). This exam can help find problems such as tumors, polyps, inflammation, and areas of bleeding. The exam takes about 1 hour.  LET [facility] CARE PROVIDER KNOW ABOUT:   Any allergies you have.  All medicines you are taking, including vitamins, herbs, eye drops, creams, and over-the-counter medicines.  Previous problems you or members of your family have had with the use of anesthetics.  Any blood disorders you have.  Previous surgeries you have had.  Medical conditions you have. RISKS AND COMPLICATIONS  Generally, this is a safe procedure. However, as with any procedure, complications can occur. Possible complications include:  Bleeding.  Tearing or rupture of the colon wall.  Reaction to medicines given during the exam.  Infection (rare). BEFORE THE PROCEDURE   Ask your health care provider about changing or stopping your regular medicines.  You may be prescribed an oral bowel prep. This involves drinking a large amount of medicated liquid, starting the day before your procedure. The liquid will cause you to have multiple loose stools until your stool is almost clear or light green. This cleans out your colon in preparation for the procedure.  Do not eat or drink anything else once you have started the bowel prep, unless your health care provider tells you it is safe to do so.  Arrange for someone to drive you home after the procedure. PROCEDURE   You will be given medicine to help you relax (sedative).  You will lie on your side with your knees bent.  A long, flexible tube with a light and camera on the end (colonoscope) will be inserted through the rectum and into the colon. The camera sends video back to a computer screen as it moves through the  colon. The colonoscope also releases carbon dioxide gas to inflate the colon. This helps your health care provider see the area better.  During the exam, your health care provider may take a small tissue sample (biopsy) to be examined under a microscope if any abnormalities are found.  The exam is finished when the entire colon has been viewed. AFTER THE PROCEDURE   Do not drive for 24 hours after the exam.  You may have a small amount of blood in your stool.  You may pass moderate amounts of gas and have mild abdominal cramping or bloating. This is caused by the gas used to inflate your colon during the exam.  Ask when your test results will be ready and how you will get your results. Make sure you get your test results. Document Released: 09/06/2000 Document Revised: 06/30/2013 Document Reviewed: 05/17/2013 [facility] Patient Information 2015 [place], [place]. This information is not intended to replace advice given to you by your health care provider. Make sure you discuss any questions you have with your health care provider.  Health Maintenance A healthy lifestyle and preventative care can promote health and wellness.  Maintain regular health, dental, and eye exams.  Eat a healthy diet. Foods like vegetables, fruits, whole grains, low-fat dairy products, and lean protein foods contain the nutrients you need and are low in calories. Decrease your intake of foods high in solid fats, added sugars, and salt. Get information about a proper diet from your health care provider, if  necessary.  Regular physical exercise is one of the most important things you can do for your health. Most adults should get at least 150 minutes of moderate-intensity exercise (any activity that increases your heart rate and causes you to sweat) each week. In addition, most adults need muscle-strengthening exercises on 2 or more days a week.   Maintain a healthy weight. The body mass index (BMI) is a screening tool  to identify possible weight problems. It provides an estimate of body fat based on height and weight. Your health care provider can find your BMI and can help you achieve or maintain a healthy weight. For males 20 years and older:  A BMI below 18.5 is considered underweight.  A BMI of 18.5 to 24.9 is normal.  A BMI of 25 to 29.9 is considered overweight.  A BMI of 30 and above is considered obese.  Maintain normal blood lipids and cholesterol by exercising and minimizing your intake of saturated fat. Eat a balanced diet with plenty of fruits and vegetables. Blood tests for lipids and cholesterol should begin at age 70 and be repeated every 5 years. If your lipid or cholesterol levels are high, you are over age 80, or you are at high risk for heart disease, you may need your cholesterol levels checked more frequently.Ongoing high lipid and cholesterol levels should be treated with medicines if diet and exercise are not working.  If you smoke, find out from your health care provider how to quit. If you do not use tobacco, do not start.  Lung cancer screening is recommended for adults aged 27-80 years who are at high risk for developing lung cancer because of a history of smoking. A yearly low-dose CT scan of the lungs is recommended for people who have at least a 30-pack-year history of smoking and are current smokers or have quit within the past 15 years. A pack year of smoking is smoking an average of 1 pack of cigarettes a day for 1 year (for example, a 30-pack-year history of smoking could mean smoking 1 pack a day for 30 years or 2 packs a day for 15 years). Yearly screening should continue until the smoker has stopped smoking for at least 15 years. Yearly screening should be stopped for people who develop a health problem that would prevent them from having lung cancer treatment.  If you choose to drink alcohol, do not have more than 2 drinks per day. One drink is considered to be 12 oz (360 mL)  of beer, 5 oz (150 mL) of wine, or 1.5 oz (45 mL) of liquor.  Avoid the use of street drugs. Do not share needles with anyone. Ask for help if you need support or instructions about stopping the use of drugs.  High blood pressure causes heart disease and increases the risk of stroke. Blood pressure should be checked at least every 1-2 years. Ongoing high blood pressure should be treated with medicines if weight loss and exercise are not effective.  If you are 60-78 years old, ask your health care provider if you should take aspirin to prevent heart disease.  Diabetes screening involves taking a blood sample to check your fasting blood sugar level. This should be done once every 3 years after age 14 if you are at a normal weight and without risk factors for diabetes. Testing should be considered at a younger age or be carried out more frequently if you are overweight and have at least 1 risk factor for  diabetes.  Colorectal cancer can be detected and often prevented. Most routine colorectal cancer screening begins at the age of 59 and continues through age 9. However, your health care provider may recommend screening at an earlier age if you have risk factors for colon cancer. On a yearly basis, your health care provider may provide home test kits to check for hidden blood in the stool. A small camera at the end of a tube may be used to directly examine the colon (sigmoidoscopy or colonoscopy) to detect the earliest forms of colorectal cancer. Talk to your health care provider about this at age 26 when routine screening begins. A direct exam of the colon should be repeated every 5-10 years through age 60, unless early forms of precancerous polyps or small growths are found.  People who are at an increased risk for hepatitis B should be screened for this virus. You are considered at high risk for hepatitis B if:  You were born in a country where hepatitis B occurs often. Talk with your health care  provider about which countries are considered high risk.  Your parents were born in a high-risk country and you have not received a shot to protect against hepatitis B (hepatitis B vaccine).  You have HIV or AIDS.  You use needles to inject street drugs.  You live with, or have sex with, someone who has hepatitis B.  You are a man who has sex with other men (MSM).  You get hemodialysis treatment.  You take certain medicines for conditions like cancer, organ transplantation, and autoimmune conditions.  Hepatitis C blood testing is recommended for all people born from [birth year] through 1964 and any individual with known risk factors for hepatitis C.  Healthy men should no longer receive prostate-specific antigen (PSA) blood tests as part of routine cancer screening. Talk to your health care provider about prostate cancer screening.  Testicular cancer screening is not recommended for adolescents or adult males who have no symptoms. Screening includes self-exam, a health care provider exam, and other screening tests. Consult with your health care provider about any symptoms you have or any concerns you have about testicular cancer.  Practice safe sex. Use condoms and avoid high-risk sexual practices to reduce the spread of sexually transmitted infections (STIs).  You should be screened for STIs, including gonorrhea and chlamydia if:  You are sexually active and are younger than 24 years.  You are older than 24 years, and your health care provider tells you that you are at risk for this type of infection.  Your sexual activity has changed since you were last screened, and you are at an increased risk for chlamydia or gonorrhea. Ask your health care provider if you are at risk.  If you are at risk of being infected with HIV, it is recommended that you take a prescription medicine daily to prevent HIV infection. This is called pre-exposure prophylaxis (PrEP). You are considered at risk if:  You  are a man who has sex with other men (MSM).  You are a heterosexual man who is sexually active with multiple partners.  You take drugs by injection.  You are sexually active with a partner who has HIV.  Talk with your health care provider about whether you are at high risk of being infected with HIV. If you choose to begin PrEP, you should first be tested for HIV. You should then be tested every 3 months for as long as you are taking PrEP.  Use  sunscreen. Apply sunscreen liberally and repeatedly throughout the day. You should seek shade when your shadow is shorter than you. Protect yourself by wearing long sleeves, pants, a wide-brimmed hat, and sunglasses year round whenever you are outdoors.  Tell your health care provider of new moles or changes in moles, especially if there is a change in shape or color. Also, tell your health care provider if a mole is larger than the size of a pencil eraser.  A one-time screening for abdominal aortic aneurysm (AAA) and surgical repair of large AAAs by ultrasound is recommended for men aged 65-75 years who are current or former smokers.  Stay current with your vaccines (immunizations). Document Released: 03/07/2008 Document Revised: 09/14/2013 Document Reviewed: 02/04/2011 [facility] Patient Information 2015 [place], [place]. This information is not intended to replace advice given to you by your health care provider. Make sure you discuss any questions you have with your health care provider.

## 2015-05-01 NOTE — Progress Notes (Addendum)
Subjective:   Francis Shaw is a 74 y.o. male who presents for an Initial Medicare Annual Wellness Visit.  Review of Systems   Risk Factors:  Cardiomyopathy-On carvedilol and losartan.  See cardiologist.  Atrial fibrillation-chronic issue.  On carvedilol and Xarelto. See cardiologist.    Hyperlipidemia-Heart healthy diet and exercise encouraged.     Sleep patterns:  8-9 hours, wakes up rested  Home Safety/Smoke Alarms:  Feels safe at home; smoke and carbon monoxide detectors present Firearm Safety: 1 firearm; locked away safely Seat Belt Safety/Bike Helmet:  Always seat belt.    Counseling:   Eye Exam-Encouraged to schedule an appointment.   Dental-per patient, goes every 6 months. Male:  CCS- UTD; 2008-normal per patient    PSA- 11/26/11  Zoster- 10/2013 at [facility] in [place] per patient    PNA 13-DUE, will receive today.  Flu- discussed.       Objective:    Today's Vitals   05/01/15 1000  BP: 124/78  Pulse: 61  Temp: 97.6 F (36.4 C)  TempSrc: Oral  Height: 5' 9.75" (1.772 m)  Weight: 179 lb 9.6 oz (81.466 kg)  SpO2: 96%  PainSc: 0-No pain    Current Medications (verified) Outpatient Encounter Prescriptions as of 05/01/2015  Medication Sig  . acetaminophen (TYLENOL) 500 MG tablet Take 1,000 mg by mouth every 6 (six) hours as needed for mild pain or headache.  . carvedilol (COREG) 12.5 MG tablet TAKE 1 TABLET BY MOUTH TWICE DAILY WITH A MEAL  . clobetasol cream (TEMOVATE) 0.86 % Apply 1 application topically 2 (two) times daily as needed (anal itch). prn  . fluticasone (FLONASE) 50 MCG/ACT nasal spray SHAKE WELL AND USE 2 SPRAYS IN EACH NOSTRIL DAILY  . guaiFENesin (MUCINEX) 600 MG 12 hr tablet Take 600 mg by mouth daily as needed for to loosen phlegm.   . loratadine (CLARITIN) 10 MG tablet Take 1 tablet (10 mg total) by mouth daily.  Marland Kitchen losartan (COZAAR) 25 MG tablet TAKE 1 TABLET BY MOUTH EVERY DAY  . Multiple Vitamins-Minerals (CENTRUM SILVER PO) Take 1  tablet by mouth daily.  Alveda Reasons 20 MG TABS tablet TAKE 1 TABLET BY MOUTH EVERY DAY WITH SUPPER  . [DISCONTINUED] azithromycin (ZITHROMAX) 250 MG tablet Take 2 tablets by mouth on day 1, followed by 1 tablet by mouth daily for 4 days.  . [DISCONTINUED] HYDROcodone-homatropine (HYCODAN) 5-1.5 MG/5ML syrup Take 5 mLs by mouth every 6 (six) hours as needed for cough. (Patient not taking: Reported on 05/01/2015)   No facility-administered encounter medications on file as of 05/01/2015.    Allergies (verified) Review of patient's allergies indicates no known allergies.   History: Past Medical History  Diagnosis Date  . Skin lesion 11/26/2011  . Dermatitis 11/22/2012  . Esophageal reflux 04/12/2013  . Pericarditis   . Atrial flutter   . Atrial fibrillation     a. Eliquis initiated 04/2013.  Marland Kitchen Nonischemic cardiomyopathy     a. presumed to be tachy mediated;  b. 03/2013 Echo: EF 30-35%, diff HK, mild LVH, mildly dil LA.   Past Surgical History  Procedure Laterality Date  . Calcaneal osteotomy Right 1993    "stapled it; screwed it" (04/28/2013)  . 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 Tonsillectomy  1940's  . Calcaneus hardware removal Right ~ 1994  . Left heart catheterization with coronary angiogram N/A 09/30/2013    Procedure: LEFT HEART CATHETERIZATION WITH CORONARY ANGIOGRAM;  Surgeon: Burnell Blanks, MD;  Location: [facility] CATH [facility];  Service: Cardiovascular;  Laterality: N/A;   Family History  Problem Relation Age of Onset  . Stroke Father   . Asthma Mother     low back pain   Social History   Occupational History  . Not on file.   Social History Main Topics  . Smoking status: Former Smoker -- 1.00 packs/day for 18 years    Types: Cigarettes    Quit date: 09/23/1977  . Smokeless tobacco: Never Used  . Alcohol Use: Yes     Comment: occasionally   . Drug Use: No  . Sexual Activity:    Partners: Female   Tobacco Counseling Counseling given: Not  Answered   Activities of Daily Living In your present state of health, do you have any difficulty performing the following activities: 05/01/2015  Hearing? N  Vision? N  Difficulty concentrating or making decisions? N  Walking or climbing stairs? N  Dressing or bathing? N  Doing errands, shopping? N  Preparing Food and eating ? Y  Using the Toilet? Y  In the past six months, have you accidently leaked urine? N  Do you have problems with loss of bowel control? N  Managing your Medications? Y  Managing your Finances? Y  Housekeeping or managing your Housekeeping? Y    Immunizations and Health Maintenance Immunization History  Administered Date(s) Administered  . Pneumococcal Conjugate-13 05/01/2015  . Pneumococcal Polysaccharide-23 03/05/2012  . Td 09/23/2006  . Tdap 03/05/2012  . Zoster 10/24/2013   Health Maintenance Due  Topic Date Due  . INFLUENZA VACCINE  04/24/2015    Patient Care Team: Mosie Lukes, MD as PCP - General (Family Medicine) Lelon Perla, MD as Consulting Physician (Cardiology)  Indicate any recent Medical Services you may have received from other than [facility] providers in the past year (date may be approximate).    Assessment:   This is a routine wellness examination for Francis Shaw.  Hearing/Vision screen  Hearing Screening   125Hz  250Hz  500Hz  1000Hz  2000Hz  4000Hz  8000Hz   Right ear:     100    Left ear:     100    Vision Screening Comments: No recent changes in vision.  Discussed making an appointment.    Dietary issues and exercise activities discussed: States he eats healthy.  Very little eating out.  Lean meats, fruits/veggies, little calcium.    Goes to play golf once a week.    Goals    . Maintain Health      Visit your primary care provider regularly.  Make time for family and friends.  Healthy relationships are important.  Take medications as directed by your provider.  Maintain a healthy weight and a trim waistline.  Eat healthy  meals and snacks, rich in fruits, vegetables, whole grains and lean proteins.  Aim for 150 minutes of moderate physical activity each week.  Don't smoke.  Avoid alcohol or drink in moderation.  Manage stress through prayer, meditation or mindful relaxation.  Get seven to nine hours of quality sleep each night.         Depression Screen PHQ 2/9 Scores 05/01/2015  PHQ - 2 Score 0    Fall Risk Fall Risk  05/01/2015  Falls in the past year? No    Cognitive Function: MMSE - Mini Mental State Exam 05/01/2015  Orientation to time 5  Orientation to Place 5  Registration 3  Attention/ Calculation 5  Recall 2  Language- name 2 objects 2  Language- repeat  1  Language- follow 3 step command 3  Language- read & follow direction 1  Write a sentence 1  Copy design 1  Total score 29    Screening Tests Health Maintenance  Topic Date Due  . INFLUENZA VACCINE  04/24/2015  . COLONOSCOPY  09/23/2016  . TETANUS/TDAP  03/05/2022  . ZOSTAVAX  Completed  . PNA vac Low Risk Adult  Completed        Plan:  Follow up and labs with Dr. Charlett Blake in 6 months.    During the course of the visit Wisdom was educated and counseled about the following appropriate screening and preventive services:   Vaccines to include Pneumoccal, Influenza, Hepatitis B, Td, Zostavax, HCV  Electrocardiogram  Colorectal cancer screening  Cardiovascular disease screening  Diabetes screening  Glaucoma screening  Nutrition counseling  Prostate cancer screening  Smoking cessation counseling  Patient Instructions (the written plan) were given to the patient.   Rudene Anda, RN   05/03/2015

## 2015-05-01 NOTE — Progress Notes (Signed)
Pre visit review using our clinic review tool, if applicable. No additional management support is needed unless otherwise documented below in the visit note. 

## 2015-05-04 ENCOUNTER — Telehealth: Payer: Self-pay | Admitting: *Deleted

## 2015-05-04 DIAGNOSIS — Z7901 Long term (current) use of anticoagulants: Secondary | ICD-10-CM

## 2015-05-04 NOTE — Telephone Encounter (Signed)
Left message for pt to call, he is due for 6 month blood work for [employer]. He will need cbc and bmp. ? Where would he like to do labs?

## 2015-05-05 NOTE — Telephone Encounter (Signed)
Pt was returning Debra's call from yesterday about his labs. He stated that he would like to have his labs done at the [facility] on [address] in [place]. Please f/u with him once these have been place  Thanks

## 2015-05-05 NOTE — Telephone Encounter (Signed)
Orders for CBC & BMP at [facility] placed, pt notified.

## 2015-05-08 ENCOUNTER — Encounter: Payer: Self-pay | Admitting: Medical

## 2015-05-08 ENCOUNTER — Ambulatory Visit (INDEPENDENT_AMBULATORY_CARE_PROVIDER_SITE_OTHER): Payer: Medicare HMO | Admitting: Medical

## 2015-05-08 VITALS — BP 115/73 | HR 80 | Temp 98.5°F | Ht 71.0 in | Wt 181.4 lb

## 2015-05-08 DIAGNOSIS — R062 Wheezing: Secondary | ICD-10-CM

## 2015-05-08 DIAGNOSIS — R05 Cough: Secondary | ICD-10-CM | POA: Diagnosis not present

## 2015-05-08 DIAGNOSIS — R059 Cough, unspecified: Secondary | ICD-10-CM

## 2015-05-08 MED ORDER — AZITHROMYCIN 250 MG PO TABS: ORAL_TABLET | ORAL | Status: DC

## 2015-05-08 MED ORDER — FLUTICASONE PROPIONATE 50 MCG/ACT NA SUSP: 2.0000 | Freq: Every day | NASAL | Status: DC

## 2015-05-08 MED ORDER — HYDROCODONE-HOMATROPINE 5-1.5 MG/5ML PO SYRP: 5.0000 mL | ORAL_SOLUTION | Freq: Three times a day (TID) | ORAL | Status: DC | PRN

## 2015-05-08 MED ORDER — ALBUTEROL SULFATE HFA 108 (90 BASE) MCG/ACT IN AERS: 2.0000 | INHALATION_SPRAY | Freq: Four times a day (QID) | RESPIRATORY_TRACT | Status: DC | PRN

## 2015-05-08 NOTE — Progress Notes (Signed)
Pre visit review using our clinic review tool, if applicable. No additional management support is needed unless otherwise documented below in the visit note. 

## 2015-05-08 NOTE — Patient Instructions (Addendum)
You cough may be allergy related. I will rx hycodan cough syrup and want you to increase your flonase to 2 sprays each nostril every day.  For your occasional wheeze. If this worsens then use albuterol inhaler.  If you cough worsens with production then can start azithromycin. But would discourage present use as this would be to early.   Follow up in 7-10 days if any symptoms persist or as needed.

## 2015-05-08 NOTE — Progress Notes (Signed)
   Subjective:    Patient ID: Francis Shaw, male    DOB: 1940-12-19, 74 y.o.   MRN: [id number]  HPI  2 days of cough. No mucous production. Dry cough. Tickle in throat. Cough keeping him up at night.   No fevers, no chills or sweats.  Pt does not smoke. Quit smoking 25 years ago. Some sneezing. No known pnd. Pt is using flonase only one spray each nares q day.  Review of Systems  Constitutional: Negative for fever, chills and fatigue.  HENT: Positive for sneezing. Negative for congestion, ear pain, nosebleeds, postnasal drip, sinus pressure, sore throat and voice change.   Respiratory: Positive for cough and wheezing. Negative for choking and shortness of breath.        Some mild wheeze at night last 2 days.  Cardiovascular: Negative for chest pain and palpitations.  Gastrointestinal: Negative for abdominal pain.  Musculoskeletal: Negative for back pain.  Hematological: Negative for adenopathy. Does not bruise/bleed easily.  Psychiatric/Behavioral: Negative for behavioral problems, dysphoric mood and decreased concentration.       Objective:   Physical Exam  General  Mental Status - Alert. General Appearance - Well groomed. Not in acute distress.  Skin Rashes- No Rashes.  HEENT Head- Normal. Ear Auditory Canal - Left- Normal. Right - Normal.Tympanic Membrane- Left- Normal. Right- Normal. Eye Sclera/Conjunctiva- Left- Normal. Right- Normal. Nose & Sinuses Nasal Mucosa- Left-  Boggy and Congested. Right-  Boggy and  Congested.Bilateral no  maxillary and no frontal sinus pressure. Mouth & Throat Lips: Upper Lip- Normal: no dryness, cracking, pallor, cyanosis, or vesicular eruption. Lower Lip-Normal: no dryness, cracking, pallor, cyanosis or vesicular eruption. Buccal Mucosa- Bilateral- No Aphthous ulcers. Oropharynx- No Discharge or Erythema. +pnd Tonsils: Characteristics- Bilateral- No Erythema or Congestion. Size/Enlargement- Bilateral- No enlargement. Discharge-  bilateral-None.  Neck Neck- Supple. No Masses.   Chest and Lung Exam Auscultation: Breath Sounds:-Clear even and unlabored.  Cardiovascular Auscultation:Rythm- Regular, rate and rhythm. Murmurs & Other Heart Sounds:Ausculatation of the heart reveal- No Murmurs.  Lymphatic Head & Neck General Head & Neck Lymphatics: Bilateral: Description- No Localized lymphadenopathy.       Assessment & Plan:  You cough may be allergy related. I will rx hycodan cough syrup and want you to increase your flonase to 2 sprays each nostril every day.  For your occasional wheeze. If this worsens then use albuterol inhaler.  If you cough worsens with production then can start azithromycin. But would discourage present use as this would be to early.   Follow up in 7-10 days if any symptoms persist or as needed.

## 2015-05-09 ENCOUNTER — Other Ambulatory Visit: Payer: Self-pay | Admitting: Family Medicine

## 2015-05-09 ENCOUNTER — Encounter: Payer: Self-pay | Admitting: Family Medicine

## 2015-05-09 ENCOUNTER — Ambulatory Visit: Payer: Medicare HMO | Admitting: Family Medicine

## 2015-05-09 DIAGNOSIS — E785 Hyperlipidemia, unspecified: Secondary | ICD-10-CM

## 2015-05-09 DIAGNOSIS — R030 Elevated blood-pressure reading, without diagnosis of hypertension: Secondary | ICD-10-CM

## 2015-05-09 DIAGNOSIS — IMO0001 Reserved for inherently not codable concepts without codable children: Secondary | ICD-10-CM

## 2015-05-09 LAB — CBC
HCT: 42.3 % (ref 39.0–52.0)
Hemoglobin: 14.6 g/dL (ref 13.0–17.0)
MCH: 30.2 pg (ref 26.0–34.0)
MCHC: 34.5 g/dL (ref 30.0–36.0)
MCV: 87.6 fL (ref 78.0–100.0)
MPV: 10.3 fL (ref 8.6–12.4)
PLATELETS: 163 10*3/uL (ref 150–400)
RBC: 4.83 MIL/uL (ref 4.22–5.81)
RDW: 13.1 % (ref 11.5–15.5)
WBC: 9.2 10*3/uL (ref 4.0–10.5)

## 2015-05-09 LAB — BASIC METABOLIC PANEL
BUN: 9 mg/dL (ref 7–25)
CO2: 29 mmol/L (ref 20–31)
Calcium: 9.1 mg/dL (ref 8.6–10.3)
Chloride: 104 mmol/L (ref 98–110)
Creat: 0.73 mg/dL (ref 0.70–1.18)
Glucose, Bld: 94 mg/dL (ref 65–99)
Potassium: 4.2 mmol/L (ref 3.5–5.3)
SODIUM: 139 mmol/L (ref 135–146)

## 2015-05-11 ENCOUNTER — Encounter: Payer: Self-pay | Admitting: Medical

## 2015-05-12 ENCOUNTER — Encounter: Payer: Self-pay | Admitting: Medical

## 2015-05-12 ENCOUNTER — Ambulatory Visit (INDEPENDENT_AMBULATORY_CARE_PROVIDER_SITE_OTHER): Payer: Medicare HMO | Admitting: Medical

## 2015-05-12 ENCOUNTER — Ambulatory Visit (HOSPITAL_BASED_OUTPATIENT_CLINIC_OR_DEPARTMENT_OTHER)
Admission: RE | Admit: 2015-05-12 | Discharge: 2015-05-12 | Disposition: A | Discharge status: Home / Self Care | Payer: Medicare HMO | Source: Ambulatory Visit | Attending: Medical | Admitting: Medical

## 2015-05-12 VITALS — BP 110/70 | HR 78 | Temp 98.1°F | Resp 16 | Ht 71.0 in | Wt 179.2 lb

## 2015-05-12 DIAGNOSIS — R05 Cough: Secondary | ICD-10-CM

## 2015-05-12 DIAGNOSIS — J209 Acute bronchitis, unspecified: Secondary | ICD-10-CM

## 2015-05-12 DIAGNOSIS — M25572 Pain in left ankle and joints of left foot: Secondary | ICD-10-CM

## 2015-05-12 DIAGNOSIS — R059 Cough, unspecified: Secondary | ICD-10-CM

## 2015-05-12 DIAGNOSIS — J449 Chronic obstructive pulmonary disease, unspecified: Secondary | ICD-10-CM | POA: Diagnosis not present

## 2015-05-12 DIAGNOSIS — R062 Wheezing: Secondary | ICD-10-CM

## 2015-05-12 LAB — POCT RAPID STREP A (OFFICE): Rapid Strep A Screen: NEGATIVE

## 2015-05-12 MED ORDER — CEFTRIAXONE SODIUM 1 G IJ SOLR
1.0000 g | INTRAMUSCULAR | Status: DC
  Administered 2015-05-12: 1 g via INTRAMUSCULAR

## 2015-05-12 MED ORDER — PREDNISONE 20 MG PO TABS: ORAL_TABLET | ORAL | Status: DC

## 2015-05-12 MED ORDER — HYDROCODONE-HOMATROPINE 5-1.5 MG/5ML PO SYRP: 5.0000 mL | ORAL_SOLUTION | Freq: Three times a day (TID) | ORAL | Status: DC | PRN

## 2015-05-12 MED ORDER — BECLOMETHASONE DIPROPIONATE 40 MCG/ACT IN AERS: 2.0000 | INHALATION_SPRAY | Freq: Two times a day (BID) | RESPIRATORY_TRACT | Status: DC

## 2015-05-12 NOTE — Addendum Note (Signed)
Addended by: Tasia Catchings on: 05/12/2015 11:45 AM   Modules accepted: Orders

## 2015-05-12 NOTE — Progress Notes (Signed)
Subjective:    Patient ID: Francis Shaw, male    DOB: [birth year], 74 y.o.   MRN: [id number]  HPI  Pt states he is coughing up mucous. Mostly at night. Pt start all meds I rx'd on monday. Rx hydrocodone, flonase, azithromycin, and albuterol.   Pt still has symptoms. At night he is describing constant coughing. Pt is wheezing a lot at night mostly.   Pt stopped smoking 35 yrs ago.  Lt ankle pain after twising ankle yesterday.  Review of Systems  Constitutional: Negative for fever, chills and fatigue.  HENT: Positive for congestion. Negative for facial swelling, postnasal drip, sinus pressure and sore throat.   Respiratory: Positive for cough and wheezing. Negative for chest tightness and shortness of breath.   Cardiovascular: Negative for chest pain and palpitations.  Musculoskeletal: Negative for back pain.       Ankle pain.  Neurological: Negative for dizziness, numbness and headaches.  Hematological: Negative for adenopathy. Does not bruise/bleed easily.  Psychiatric/Behavioral: Negative for behavioral problems and confusion.    Past Medical History  Diagnosis Date  . Skin lesion 11/26/2011  . Dermatitis 11/22/2012  . Esophageal reflux 04/12/2013  . Pericarditis   . Atrial flutter   . Atrial fibrillation     a. Eliquis initiated 04/2013.  Marland Kitchen Nonischemic cardiomyopathy     a. presumed to be tachy mediated;  b. 03/2013 Echo: EF 30-35%, diff HK, mild LVH, mildly dil LA.    Social History   Social History  . Marital Status: Married    Spouse Name: N/A  . Number of Children: 2  . Years of Education: N/A   Occupational History  . Not on file.   Social History Main Topics  . Smoking status: Former Smoker -- 1.00 packs/day for 18 years    Types: Cigarettes    Quit date: 09/23/1977  . Smokeless tobacco: Never Used  . Alcohol Use: Yes     Comment: occasionally   . Drug Use: No  . Sexual Activity:    Partners: Female   Other Topics Concern  . Not on file   Social  History Narrative    Past Surgical History  Procedure Laterality Date  . Calcaneal osteotomy Right 1993    "stapled it; screwed it" (04/28/2013)  . 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 Tonsillectomy  1940's  . Calcaneus hardware removal Right ~ 1994  . Left heart catheterization with coronary angiogram N/A 09/30/2013    Procedure: LEFT HEART CATHETERIZATION WITH CORONARY ANGIOGRAM;  Surgeon: Burnell Blanks, MD;  Location: [facility] CATH LAB;  Service: Cardiovascular;  Laterality: N/A;    Family History  Problem Relation Age of Onset  . Stroke Father   . Asthma Mother     low back pain    No Known Allergies  Current Outpatient Prescriptions on File Prior to Visit  Medication Sig Dispense Refill  . acetaminophen (TYLENOL) 500 MG tablet Take 1,000 mg by mouth every 6 (six) hours as needed for mild pain or headache.    . albuterol (PROVENTIL HFA;VENTOLIN HFA) 108 (90 BASE) MCG/ACT inhaler Inhale 2 puffs into the lungs every 6 (six) hours as needed for wheezing or shortness of breath. 1 Inhaler 0  . azithromycin (ZITHROMAX) 250 MG tablet Take 2 tablets by mouth on day 1, followed by 1 tablet by mouth daily for 4 days. 6 tablet 0  . carvedilol (COREG) 12.5 MG tablet TAKE 1 TABLET BY MOUTH TWICE DAILY WITH A  MEAL 180 tablet 3  . clobetasol cream (TEMOVATE) 6.81 % Apply 1 application topically 2 (two) times daily as needed (anal itch). prn    . fluticasone (FLONASE) 50 MCG/ACT nasal spray SHAKE WELL AND USE 2 SPRAYS IN EACH NOSTRIL DAILY 16 g 0  . fluticasone (FLONASE) 50 MCG/ACT nasal spray Place 2 sprays into both nostrils daily. 16 g 3  . HYDROcodone-homatropine (HYCODAN) 5-1.5 MG/5ML syrup Take 5 mLs by mouth every 8 (eight) hours as needed for cough. 120 mL 0  . loratadine (CLARITIN) 10 MG tablet Take 1 tablet (10 mg total) by mouth daily. 30 tablet 0  . losartan (COZAAR) 25 MG tablet TAKE 1 TABLET BY MOUTH EVERY DAY 30 tablet 6  . Multiple Vitamins-Minerals (CENTRUM  SILVER PO) Take 1 tablet by mouth daily.    Alveda Reasons 20 MG TABS tablet TAKE 1 TABLET BY MOUTH EVERY DAY WITH SUPPER 30 tablet 5   No current facility-administered medications on file prior to visit.    BP 110/70 mmHg  Pulse 78  Temp(Src) 98.1 F (36.7 C) (Oral)  Resp 16  Ht 5\' 11"  (1.803 m)  Wt 179 lb 3.2 oz (81.285 kg)  BMI 25.00 kg/m2  SpO2 97%       Objective:   Physical Exam  General  Mental Status - Alert. General Appearance - Well groomed. Not in acute distress.  Skin Rashes- No Rashes.  HEENT Head- Normal. Ear Auditory Canal - Left- Normal. Right - Normal.Tympanic Membrane- Left- Normal. Right- Normal. Eye Sclera/Conjunctiva- Left- Normal. Right- Normal. Nose & Sinuses Nasal Mucosa- Left-  Notb oggy or Congested. Right-  Not  boggy or Congested. Mouth & Throat Lips: Upper Lip- Normal: no dryness, cracking, pallor, cyanosis, or vesicular eruption. Lower Lip-Normal: no dryness, cracking, pallor, cyanosis or vesicular eruption. Buccal Mucosa- Bilateral- No Aphthous ulcers. Oropharynx- No Discharge or Erythema. Tonsils: Characteristics- Bilateral- No Erythema or Congestion. Size/Enlargement- Bilateral- No enlargement. Discharge- bilateral-None.  Neck Neck- Supple. No Masses.   Chest and Lung Exam Auscultation: Breath Sounds:- even and unlabored, but bilateral faint  upper lobe rhonchi scattered expiratory.  Cardiovascular Auscultation:Rythm- Regular, rate and rhythm. Murmurs & Other Heart Sounds:Ausculatation of the heart reveal- No Murmurs.  Lymphatic Head & Neck General Head & Neck Lymphatics: Bilateral: Description- No Localized lymphadenopathy.  Lt ankle- lateral aspect. Talofibular tenderness. Mild swelling  Lt foot- no pain on palpation.       Assessment & Plan:  May have residual bronchitis. Will give rocephin 1 gm im today. Get cxr.  For wheezing(which appears to be primary issue by exam). 3 day prednisone. Add qvar inhaler. Continue  albuterol   For cough. Refill hycodan. Fill when finish active rx.  Follow up 10-14 days or as needed.  Ankle xray. Rice therapy.

## 2015-05-12 NOTE — Progress Notes (Signed)
Pre visit review using our clinic review tool, if applicable. No additional management support is needed unless otherwise documented below in the visit note. 

## 2015-05-12 NOTE — Addendum Note (Signed)
Addended by: Tasia Catchings on: 05/12/2015 03:57 PM   Modules accepted: Orders

## 2015-05-12 NOTE — Patient Instructions (Addendum)
May have residual bronchitis. Will give rocephin 1 gm im today. Get cxr.  For wheezing(which appears to be primary issue by exam). 3 day prednisone. Add qvar inhaler. Continue albuterol   For cough. Refill hycodan. Fill when finish active rx.  Follow up 10-14 days or as needed.  Get ankle xray. Continue rice therapy.

## 2015-05-15 ENCOUNTER — Telehealth: Payer: Self-pay | Admitting: Medical

## 2015-05-15 DIAGNOSIS — R05 Cough: Secondary | ICD-10-CM

## 2015-05-15 DIAGNOSIS — R059 Cough, unspecified: Secondary | ICD-10-CM

## 2015-05-16 MED ORDER — PREDNISONE 20 MG PO TABS: ORAL_TABLET | ORAL | Status: DC

## 2015-05-16 NOTE — Telephone Encounter (Signed)
Notify pt that referral to pulmonologist was placed.

## 2015-05-16 NOTE — Telephone Encounter (Signed)
Patient notified,med refilled and referral place.

## 2015-05-16 NOTE — Telephone Encounter (Signed)
Refill of his prednisone. Sent in. Let pt know.

## 2015-05-16 NOTE — Telephone Encounter (Signed)
Please advise 

## 2015-05-24 ENCOUNTER — Ambulatory Visit (INDEPENDENT_AMBULATORY_CARE_PROVIDER_SITE_OTHER): Payer: Medicare HMO | Admitting: Pulmonary Disease

## 2015-05-24 ENCOUNTER — Encounter: Payer: Self-pay | Admitting: Pulmonary Disease

## 2015-05-24 VITALS — BP 134/70 | HR 73 | Ht 71.0 in | Wt 177.0 lb

## 2015-05-24 DIAGNOSIS — R05 Cough: Secondary | ICD-10-CM

## 2015-05-24 DIAGNOSIS — R0602 Shortness of breath: Secondary | ICD-10-CM

## 2015-05-24 DIAGNOSIS — R059 Cough, unspecified: Secondary | ICD-10-CM | POA: Insufficient documentation

## 2015-05-24 DIAGNOSIS — J209 Acute bronchitis, unspecified: Secondary | ICD-10-CM | POA: Diagnosis not present

## 2015-05-24 NOTE — Assessment & Plan Note (Signed)
He had a persistent cough after recent episode of bronchitis. He was referred to me today to assess for the possibility of COPD based on a chest x-ray finding.  At this point his cough has resolved. He had no postnasal drip exacerbating it but I do wonder whether or not he may of had some acid reflux. He describes a gravelly throat and a history of having heartburn in the past.  Today we perform simple spirometry in clinic and this did not show airflow obstruction so he does not have COPD.  Plan: I suggested Prilosec to help with the cough, he says that he doesn't think he needs it since the cough is almost completely resolved Follow-up with [place] as needed

## 2015-05-24 NOTE — Assessment & Plan Note (Signed)
He has had repeated episodes of this problem but recently his symptoms have resolved. His recent chest x-ray suggested COPD but today's simple spirometry test showed that he does not have this condition.  There is no evidence of lung disease at this time.  Plan: See above

## 2015-05-24 NOTE — Patient Instructions (Signed)
You do not have COPD  For your Cough: Try taking prilosec daily if it recurs  Follow up with [place] if symptoms worsen or recur

## 2015-05-24 NOTE — Progress Notes (Signed)
Subjective:    Patient ID: Francis Shaw, male    DOB: [birth year], 74 y.o.   MRN: [id number]  HPI Chief Complaint  Patient presents with  . Advice Only    referred for copd.  pt states he has recurrent bronchitis Xapprox 2 yrs.     This is a very pleasant 74 year old male who had no childhood respiratory illnesses but smoked one pack of cigarettes daily for 18 years, quitting at age 63, who comes to my clinic today for evaluation of cough. He says that throughout his adult life he's never had problems with shortness of breath but he says in the last years he's had multiple episodes of bronchitis which should been hard to treat. Specifically, he says that he's had to go to the doctor several times when he gets bronchitis. He typically will be treated with 2 rounds of antibiotics, prednisone, and Hycodan. He says that eventually this will help him get rid of the cough. When he gets episodes of bronchitis he denies shortness of breath but he says he has some wheezing, rattling in his chest, and a frequent cough. His most recent episode was earlier this month and he was last seen on August 19 when he was treated with antibiotics, prednisone, and Hycodan. He says that his cough is significantly improved but he still has "gravel" in his throat in a lingering cough. He denies heartburn symptoms but he says he's had it in the past from time to time. He does not have postnasal drip. He takes Flonase which he says controls this well. Past Medical History  Diagnosis Date  . Skin lesion 11/26/2011  . Dermatitis 11/22/2012  . Esophageal reflux 04/12/2013  . Pericarditis   . Atrial flutter   . Atrial fibrillation     a. Eliquis initiated 04/2013.  Marland Kitchen Nonischemic cardiomyopathy     a. presumed to be tachy mediated;  b. 03/2013 Echo: EF 30-35%, diff HK, mild LVH, mildly dil LA.     Family History  Problem Relation Age of Onset  . Stroke Father   . Asthma Mother     low back pain     Social History    Social History  . Marital Status: Married    Spouse Name: N/A  . Number of Children: 2  . Years of Education: N/A   Occupational History  . Not on file.   Social History Main Topics  . Smoking status: Former Smoker -- 1.00 packs/day for 18 years    Types: Cigarettes    Quit date: 09/24/1979  . Smokeless tobacco: Never Used  . Alcohol Use: 0.0 oz/week    0 Standard drinks or equivalent per week     Comment: occasionally   . Drug Use: No  . Sexual Activity:    Partners: Female   Other Topics Concern  . Not on file   Social History Narrative     No Known Allergies   Outpatient Prescriptions Prior to Visit  Medication Sig Dispense Refill  . acetaminophen (TYLENOL) 500 MG tablet Take 1,000 mg by mouth every 6 (six) hours as needed for mild pain or headache.    . albuterol (PROVENTIL HFA;VENTOLIN HFA) 108 (90 BASE) MCG/ACT inhaler Inhale 2 puffs into the lungs every 6 (six) hours as needed for wheezing or shortness of breath. 1 Inhaler 0  . beclomethasone (QVAR) 40 MCG/ACT inhaler Inhale 2 puffs into the lungs 2 (two) times daily. 1 Inhaler 2  . carvedilol (COREG) 12.5 MG tablet  TAKE 1 TABLET BY MOUTH TWICE DAILY WITH A MEAL 180 tablet 3  . clobetasol cream (TEMOVATE) 0.35 % Apply 1 application topically 2 (two) times daily as needed (anal itch). prn    . fluticasone (FLONASE) 50 MCG/ACT nasal spray Place 2 sprays into both nostrils daily. 16 g 3  . HYDROcodone-homatropine (HYCODAN) 5-1.5 MG/5ML syrup Take 5 mLs by mouth every 8 (eight) hours as needed for cough. 120 mL 0  . loratadine (CLARITIN) 10 MG tablet Take 1 tablet (10 mg total) by mouth daily. 30 tablet 0  . losartan (COZAAR) 25 MG tablet TAKE 1 TABLET BY MOUTH EVERY DAY 30 tablet 6  . Multiple Vitamins-Minerals (CENTRUM SILVER PO) Take 1 tablet by mouth daily.    Alveda Reasons 20 MG TABS tablet TAKE 1 TABLET BY MOUTH EVERY DAY WITH SUPPER 30 tablet 5  . fluticasone (FLONASE) 50 MCG/ACT nasal spray SHAKE WELL AND USE 2  SPRAYS IN EACH NOSTRIL DAILY 16 g 0  . azithromycin (ZITHROMAX) 250 MG tablet Take 2 tablets by mouth on day 1, followed by 1 tablet by mouth daily for 4 days. (Patient not taking: Reported on 05/24/2015) 6 tablet 0  . predniSONE (DELTASONE) 20 MG tablet 1 tab po tid x 3 days (Patient not taking: Reported on 05/24/2015) 9 tablet 0   Facility-Administered Medications Prior to Visit  Medication Dose Route Frequency Provider Last Rate Last Dose  . cefTRIAXone (ROCEPHIN) injection 1 g  1 g Intramuscular Q24H Mackie Pai, PA-C   1 g at 05/12/15 1146       Review of Systems  Constitutional: Negative for fever and unexpected weight change.  HENT: Negative for congestion, dental problem, ear pain, nosebleeds, postnasal drip, rhinorrhea, sinus pressure, sneezing, sore throat and trouble swallowing.   Eyes: Negative for redness and itching.  Respiratory: Positive for cough. Negative for chest tightness, shortness of breath and wheezing.   Cardiovascular: Negative for palpitations and leg swelling.  Gastrointestinal: Negative for nausea and vomiting.  Genitourinary: Negative for dysuria.  Musculoskeletal: Negative for joint swelling.  Skin: Negative for rash.  Neurological: Negative for headaches.  Hematological: Does not bruise/bleed easily.  Psychiatric/Behavioral: Negative for dysphoric mood. The patient is not nervous/anxious.        Objective:   Physical Exam Filed Vitals:   05/24/15 1524  BP: 134/70  Pulse: 73  Height: 5\' 11"  (1.803 m)  Weight: 177 lb (80.287 kg)  SpO2: 96%  RA  Gen: well appearing, no acute distress HENT: NCAT, OP clear, neck supple without masses Eyes: PERRL, EOMi Lymph: no cervical lymphadenopathy PULM: CTA B CV: RRR, no mgr, no JVD GI: BS+, soft, nontender, no hsm Derm: no rash or skin breakdown MSK: normal bulk and tone Neuro: A&Ox4, CN II-XII intact, strength 5/5 in all 4 extremities Psyche: normal mood and affect   CXR images personally reviewed  showing no acute lung abnormality August 2016 primary care office visit reviewed where he was treated for bronchitis with prednisone.     Assessment & Plan:  Cough He had a persistent cough after recent episode of bronchitis. He was referred to me today to assess for the possibility of COPD based on a chest x-ray finding.  At this point his cough has resolved. He had no postnasal drip exacerbating it but I do wonder whether or not he may of had some acid reflux. He describes a gravelly throat and a history of having heartburn in the past.  Today we perform simple spirometry in clinic  and this did not show airflow obstruction so he does not have COPD.  Plan: I suggested Prilosec to help with the cough, he says that he doesn't think he needs it since the cough is almost completely resolved Follow-up with [place] as needed  Acute bronchitis He has had repeated episodes of this problem but recently his symptoms have resolved. His recent chest x-ray suggested COPD but today's simple spirometry test showed that he does not have this condition.  There is no evidence of lung disease at this time.  Plan: See above     Current outpatient prescriptions:  .  acetaminophen (TYLENOL) 500 MG tablet, Take 1,000 mg by mouth every 6 (six) hours as needed for mild pain or headache., Disp: , Rfl:  .  albuterol (PROVENTIL HFA;VENTOLIN HFA) 108 (90 BASE) MCG/ACT inhaler, Inhale 2 puffs into the lungs every 6 (six) hours as needed for wheezing or shortness of breath., Disp: 1 Inhaler, Rfl: 0 .  beclomethasone (QVAR) 40 MCG/ACT inhaler, Inhale 2 puffs into the lungs 2 (two) times daily., Disp: 1 Inhaler, Rfl: 2 .  carvedilol (COREG) 12.5 MG tablet, TAKE 1 TABLET BY MOUTH TWICE DAILY WITH A MEAL, Disp: 180 tablet, Rfl: 3 .  clobetasol cream (TEMOVATE) 0.03 %, Apply 1 application topically 2 (two) times daily as needed (anal itch). prn, Disp: , Rfl:  .  fluticasone (FLONASE) 50 MCG/ACT nasal spray, Place 2 sprays  into both nostrils daily., Disp: 16 g, Rfl: 3 .  HYDROcodone-homatropine (HYCODAN) 5-1.5 MG/5ML syrup, Take 5 mLs by mouth every 8 (eight) hours as needed for cough., Disp: 120 mL, Rfl: 0 .  loratadine (CLARITIN) 10 MG tablet, Take 1 tablet (10 mg total) by mouth daily., Disp: 30 tablet, Rfl: 0 .  losartan (COZAAR) 25 MG tablet, TAKE 1 TABLET BY MOUTH EVERY DAY, Disp: 30 tablet, Rfl: 6 .  Multiple Vitamins-Minerals (CENTRUM SILVER PO), Take 1 tablet by mouth daily., Disp: , Rfl:  .  XARELTO 20 MG TABS tablet, TAKE 1 TABLET BY MOUTH EVERY DAY WITH SUPPER, Disp: 30 tablet, Rfl: 5  Current facility-administered medications:  .  cefTRIAXone (ROCEPHIN) injection 1 g, 1 g, Intramuscular, Q24H, Edward Saguier, PA-C, 1 g at 05/12/15 1146

## 2015-05-30 ENCOUNTER — Other Ambulatory Visit: Payer: Self-pay | Admitting: Medical

## 2015-05-31 ENCOUNTER — Encounter: Payer: Self-pay | Admitting: Family Medicine

## 2015-05-31 MED ORDER — LORATADINE 10 MG PO TABS: 10.0000 mg | ORAL_TABLET | Freq: Every day | ORAL | Status: DC

## 2015-05-31 NOTE — Telephone Encounter (Signed)
Pt was last seen on 05/12/15. Hycodan last filled 05/12/15. Claritin last filled 12/06/14.  Please advise.

## 2015-06-02 ENCOUNTER — Other Ambulatory Visit: Payer: Self-pay | Admitting: Family Medicine

## 2015-06-02 MED ORDER — CETIRIZINE HCL 10 MG PO TABS: 10.0000 mg | ORAL_TABLET | Freq: Every day | ORAL | Status: DC

## 2015-06-02 MED ORDER — RANITIDINE HCL 150 MG PO TABS: 150.0000 mg | ORAL_TABLET | Freq: Two times a day (BID) | ORAL | Status: DC

## 2015-06-02 NOTE — Telephone Encounter (Signed)
Claritin refilled, Hycodan declined.

## 2015-06-12 ENCOUNTER — Other Ambulatory Visit: Payer: Self-pay | Admitting: Cardiology

## 2015-07-15 ENCOUNTER — Other Ambulatory Visit: Payer: Self-pay | Admitting: Cardiology

## 2015-08-15 ENCOUNTER — Ambulatory Visit (INDEPENDENT_AMBULATORY_CARE_PROVIDER_SITE_OTHER): Payer: Medicare HMO | Admitting: Family Medicine

## 2015-08-15 ENCOUNTER — Encounter: Payer: Self-pay | Admitting: Family Medicine

## 2015-08-15 ENCOUNTER — Other Ambulatory Visit: Payer: Self-pay | Admitting: Family Medicine

## 2015-08-15 ENCOUNTER — Ambulatory Visit (HOSPITAL_BASED_OUTPATIENT_CLINIC_OR_DEPARTMENT_OTHER)
Admission: RE | Admit: 2015-08-15 | Discharge: 2015-08-15 | Disposition: A | Discharge status: Home / Self Care | Payer: Medicare HMO | Source: Ambulatory Visit | Attending: Family Medicine | Admitting: Family Medicine

## 2015-08-15 VITALS — BP 120/86 | HR 72 | Temp 98.0°F | Ht 71.0 in | Wt 183.0 lb

## 2015-08-15 DIAGNOSIS — R030 Elevated blood-pressure reading, without diagnosis of hypertension: Secondary | ICD-10-CM

## 2015-08-15 DIAGNOSIS — K219 Gastro-esophageal reflux disease without esophagitis: Secondary | ICD-10-CM

## 2015-08-15 DIAGNOSIS — J984 Other disorders of lung: Secondary | ICD-10-CM

## 2015-08-15 DIAGNOSIS — R059 Cough, unspecified: Secondary | ICD-10-CM

## 2015-08-15 DIAGNOSIS — IMO0001 Reserved for inherently not codable concepts without codable children: Secondary | ICD-10-CM

## 2015-08-15 DIAGNOSIS — R918 Other nonspecific abnormal finding of lung field: Secondary | ICD-10-CM | POA: Insufficient documentation

## 2015-08-15 DIAGNOSIS — R05 Cough: Secondary | ICD-10-CM

## 2015-08-15 DIAGNOSIS — J209 Acute bronchitis, unspecified: Secondary | ICD-10-CM

## 2015-08-15 MED ORDER — CETIRIZINE HCL 10 MG PO TABS: 10.0000 mg | ORAL_TABLET | Freq: Every day | ORAL | Status: DC | PRN

## 2015-08-15 MED ORDER — RANITIDINE HCL 150 MG PO TABS: 150.0000 mg | ORAL_TABLET | Freq: Two times a day (BID) | ORAL | Status: DC | PRN

## 2015-08-15 MED ORDER — CEFDINIR 300 MG PO CAPS: 300.0000 mg | ORAL_CAPSULE | Freq: Two times a day (BID) | ORAL | Status: AC

## 2015-08-15 MED ORDER — HYDROCODONE-HOMATROPINE 5-1.5 MG/5ML PO SYRP: 5.0000 mL | ORAL_SOLUTION | Freq: Four times a day (QID) | ORAL | Status: DC | PRN

## 2015-08-15 MED ORDER — PREDNISONE 20 MG PO TABS: 20.0000 mg | ORAL_TABLET | Freq: Two times a day (BID) | ORAL | Status: DC

## 2015-08-15 NOTE — Patient Instructions (Signed)

## 2015-08-15 NOTE — Progress Notes (Signed)
Pre visit review using our clinic review tool, if applicable. No additional management support is needed unless otherwise documented below in the visit note. 

## 2015-08-16 ENCOUNTER — Other Ambulatory Visit: Payer: Self-pay | Admitting: Family Medicine

## 2015-08-16 ENCOUNTER — Telehealth: Payer: Self-pay | Admitting: Emergency Medicine

## 2015-08-16 DIAGNOSIS — R030 Elevated blood-pressure reading, without diagnosis of hypertension: Principal | ICD-10-CM

## 2015-08-16 DIAGNOSIS — IMO0001 Reserved for inherently not codable concepts without codable children: Secondary | ICD-10-CM

## 2015-08-16 NOTE — Telephone Encounter (Signed)
Disregard last message, BMP ordered by Dr.Tabori. Patient did not have CBCD, ESR, or TB Gold drawn on 08/15/2015 at office visit so I cancelled and future ordered so we can draw them all together...KMP

## 2015-08-16 NOTE — Telephone Encounter (Signed)
Francis Shaw from [facility] is requesting BUN & Creatine order for this patient to have a CT Scan done. I checked to see if it could be added to yesterdays lab but no chemistry test were done.

## 2015-08-19 ENCOUNTER — Other Ambulatory Visit: Payer: Self-pay | Admitting: Family Medicine

## 2015-08-19 ENCOUNTER — Encounter: Payer: Self-pay | Admitting: Family Medicine

## 2015-08-20 ENCOUNTER — Encounter: Payer: Self-pay | Admitting: Family Medicine

## 2015-08-20 NOTE — Progress Notes (Signed)
Subjective:    Patient ID: Francis Shaw, male    DOB: 04-09-1941, 74 y.o.   MRN: [id number]  Chief Complaint  Patient presents with  . Cough    HPI Patient is in today for evaluation of worsening cough. Has struggled with cough on and off for months but it has worsened recently again. Non productive. He does also notes some nasal congestion. No fevers, chills, malaise. Has not tried any over the counter meds. Denies CP/palp/SOB/HA/fevers/GI or GU c/o. Taking meds as prescribed  Past Medical History  Diagnosis Date  . Skin lesion 11/26/2011  . Dermatitis 11/22/2012  . Esophageal reflux 04/12/2013  . Pericarditis   . Atrial flutter ([place])   . Atrial fibrillation ([place])     a. Eliquis initiated 04/2013.  Marland Kitchen Nonischemic cardiomyopathy ([place])     a. presumed to be tachy mediated;  b. 03/2013 Echo: EF 30-35%, diff HK, mild LVH, mildly dil LA.    Past Surgical History  Procedure Laterality Date  . Calcaneal osteotomy Right 1993    "stapled it; screwed it" (04/28/2013)  . 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 Tonsillectomy  1940's  . Calcaneus hardware removal Right ~ 1994  . Left heart catheterization with coronary angiogram N/A 09/30/2013    Procedure: LEFT HEART CATHETERIZATION WITH CORONARY ANGIOGRAM;  Surgeon: Burnell Blanks, MD;  Location: [facility] CATH LAB;  Service: Cardiovascular;  Laterality: N/A;    Family History  Problem Relation Age of Onset  . Stroke Father   . Asthma Mother     low back pain    Social History   Social History  . Marital Status: Married    Spouse Name: N/A  . Number of Children: 2  . Years of Education: N/A   Occupational History  . Not on file.   Social History Main Topics  . Smoking status: Former Smoker -- 1.00 packs/day for 18 years    Types: Cigarettes    Quit date: 09/24/1979  . Smokeless tobacco: Never Used  . Alcohol Use: 0.0 oz/week    0 Standard drinks or equivalent per week     Comment: occasionally   . Drug Use:  No  . Sexual Activity:    Partners: Female   Other Topics Concern  . Not on file   Social History Narrative    Outpatient Prescriptions Prior to Visit  Medication Sig Dispense Refill  . acetaminophen (TYLENOL) 500 MG tablet Take 1,000 mg by mouth every 6 (six) hours as needed for mild pain or headache.    . carvedilol (COREG) 12.5 MG tablet TAKE 1 TABLET BY MOUTH TWICE DAILY WITH A MEAL 180 tablet 3  . fluticasone (FLONASE) 50 MCG/ACT nasal spray Place 2 sprays into both nostrils daily. 16 g 3  . losartan (COZAAR) 25 MG tablet TAKE 1 TABLET BY MOUTH EVERY DAY 90 tablet 1  . Multiple Vitamins-Minerals (CENTRUM SILVER PO) Take 1 tablet by mouth daily.    Alveda Reasons 20 MG TABS tablet TAKE 1 TABLET BY MOUTH EVERY DAY WITH DINNER 30 tablet 5  . albuterol (PROVENTIL HFA;VENTOLIN HFA) 108 (90 BASE) MCG/ACT inhaler Inhale 2 puffs into the lungs every 6 (six) hours as needed for wheezing or shortness of breath. (Patient not taking: Reported on 08/15/2015) 1 Inhaler 0  . beclomethasone (QVAR) 40 MCG/ACT inhaler Inhale 2 puffs into the lungs 2 (two) times daily. (Patient not taking: Reported on 08/15/2015) 1 Inhaler 2  . clobetasol cream (TEMOVATE) 0.05 %  Apply 1 application topically 2 (two) times daily as needed (anal itch). prn    . loratadine (CLARITIN) 10 MG tablet Take 1 tablet (10 mg total) by mouth daily. (Patient not taking: Reported on 08/15/2015) 30 tablet 0  . cetirizine (ZYRTEC) 10 MG tablet Take 1 tablet (10 mg total) by mouth daily. (Patient not taking: Reported on 08/15/2015) 30 tablet 1  . HYDROcodone-homatropine (HYCODAN) 5-1.5 MG/5ML syrup Take 5 mLs by mouth every 8 (eight) hours as needed for cough. (Patient not taking: Reported on 08/15/2015) 120 mL 0  . ranitidine (ZANTAC) 150 MG tablet Take 1 tablet (150 mg total) by mouth 2 (two) times daily. (Patient not taking: Reported on 08/15/2015) 60 tablet 1  . cefTRIAXone (ROCEPHIN) injection 1 g      No facility-administered  medications prior to visit.    No Known Allergies  Review of Systems  Constitutional: Negative for fever and malaise/fatigue.  HENT: Positive for congestion.   Eyes: Negative for discharge.  Respiratory: Positive for cough. Negative for shortness of breath.   Cardiovascular: Negative for chest pain, palpitations and leg swelling.  Gastrointestinal: Positive for heartburn. Negative for nausea and abdominal pain.  Genitourinary: Negative for dysuria.  Musculoskeletal: Negative for falls.  Skin: Negative for rash.  Neurological: Negative for loss of consciousness and headaches.  Endo/Heme/Allergies: Negative for environmental allergies.  Psychiatric/Behavioral: Negative for depression. The patient is not nervous/anxious.        Objective:    Physical Exam  Constitutional: He is oriented to person, place, and time. He appears well-developed and well-nourished. No distress.  HENT:  Head: Normocephalic and atraumatic.  Nose: Nose normal.  Eyes: Right eye exhibits no discharge. Left eye exhibits no discharge.  Neck: Normal range of motion. Neck supple.  Cardiovascular: Normal rate and regular rhythm.   No murmur heard. Pulmonary/Chest: Effort normal and breath sounds normal.  Abdominal: Soft. Bowel sounds are normal. There is no tenderness.  Musculoskeletal: He exhibits no edema.  Neurological: He is alert and oriented to person, place, and time.  Skin: Skin is warm and dry.  Psychiatric: He has a normal mood and affect.  Nursing note and vitals reviewed.   BP 120/86 mmHg  Pulse 72  Temp(Src) 98 F (36.7 C) (Oral)  Ht 5\' 11"  (1.803 m)  Wt 183 lb (83.008 kg)  BMI 25.53 kg/m2  SpO2 93% Wt Readings from Last 3 Encounters:  08/15/15 183 lb (83.008 kg)  05/24/15 177 lb (80.287 kg)  05/12/15 179 lb 3.2 oz (81.285 kg)     Lab Results  Component Value Date   WBC 9.2 05/08/2015   HGB 14.6 05/08/2015   HCT 42.3 05/08/2015   PLT 163 05/08/2015   GLUCOSE 94 05/08/2015    CHOL 134 04/29/2013   TRIG 84 04/29/2013   HDL 50 04/29/2013   LDLCALC 67 04/29/2013   ALT 18 04/28/2013   AST 19 04/28/2013   NA 139 05/08/2015   K 4.2 05/08/2015   CL 104 05/08/2015   CREATININE 0.73 05/08/2015   BUN 9 05/08/2015   CO2 29 05/08/2015   TSH 1.924 04/12/2013   PSA 2.28 11/26/2011   INR 1.26 09/27/2013   HGBA1C 5.2 04/29/2013    Lab Results  Component Value Date   TSH 1.924 04/12/2013   Lab Results  Component Value Date   WBC 9.2 05/08/2015   HGB 14.6 05/08/2015   HCT 42.3 05/08/2015   MCV 87.6 05/08/2015   PLT 163 05/08/2015   Lab Results  Component Value Date   NA 139 05/08/2015   K 4.2 05/08/2015   CO2 29 05/08/2015   GLUCOSE 94 05/08/2015   BUN 9 05/08/2015   CREATININE 0.73 05/08/2015   BILITOT 0.7 04/28/2013   ALKPHOS 76 04/28/2013   AST 19 04/28/2013   ALT 18 04/28/2013   PROT 6.1 04/28/2013   ALBUMIN 3.3* 04/28/2013   CALCIUM 9.1 05/08/2015   GFR 103.27 11/26/2011   Lab Results  Component Value Date   CHOL 134 04/29/2013   Lab Results  Component Value Date   HDL 50 04/29/2013   Lab Results  Component Value Date   LDLCALC 67 04/29/2013   Lab Results  Component Value Date   TRIG 84 04/29/2013   Lab Results  Component Value Date   CHOLHDL 2.7 04/29/2013   Lab Results  Component Value Date   HGBA1C 5.2 04/29/2013       Assessment & Plan:   Problem List Items Addressed This Visit    Acute bronchitis    Started on Cefdinir, mucinex and probiotics      Cough - Primary   Relevant Medications   predniSONE (DELTASONE) 20 MG tablet   cefdinir (OMNICEF) 300 MG capsule   HYDROcodone-homatropine (HYCODAN) 5-1.5 MG/5ML syrup   ranitidine (ZANTAC) 150 MG tablet   cetirizine (ZYRTEC) 10 MG tablet   Other Relevant Orders   DG Chest 2 View (Completed)   CBC w/Diff   Sed Rate (ESR)   Quantiferon tb gold assay   Elevated BP    Well controlled. Encouraged heart healthy diet such as the DASH diet and exercise as tolerated.         Esophageal reflux    Avoid offending foods, start probiotics. Do not eat large meals in late evening and consider raising head of bed. Continue Ranitidine       Relevant Medications   ranitidine (ZANTAC) 150 MG tablet      I have changed Francis Shaw's HYDROcodone-homatropine, ranitidine, and cetirizine. I am also having him start on predniSONE and cefdinir. Additionally, I am having him maintain his Multiple Vitamins-Minerals (CENTRUM SILVER PO), clobetasol cream, acetaminophen, carvedilol, fluticasone, albuterol, beclomethasone, loratadine, losartan, and XARELTO. We will stop administering cefTRIAXone.  Meds ordered this encounter  Medications  . predniSONE (DELTASONE) 20 MG tablet    Sig: Take 1 tablet (20 mg total) by mouth 2 (two) times daily with a meal.    Dispense:  10 tablet    Refill:  0  . cefdinir (OMNICEF) 300 MG capsule    Sig: Take 1 capsule (300 mg total) by mouth 2 (two) times daily.    Dispense:  20 capsule    Refill:  0  . HYDROcodone-homatropine (HYCODAN) 5-1.5 MG/5ML syrup    Sig: Take 5 mLs by mouth every 6 (six) hours as needed for cough.    Dispense:  120 mL    Refill:  0  . ranitidine (ZANTAC) 150 MG tablet    Sig: Take 1 tablet (150 mg total) by mouth 2 (two) times daily as needed for heartburn.    Dispense:  60 tablet    Refill:  1  . cetirizine (ZYRTEC) 10 MG tablet    Sig: Take 1 tablet (10 mg total) by mouth daily as needed for allergies.    Dispense:  30 tablet    Refill:  1     Penni Homans, MD

## 2015-08-20 NOTE — Assessment & Plan Note (Signed)
Well controlled. Encouraged heart healthy diet such as the DASH diet and exercise as tolerated.  

## 2015-08-20 NOTE — Assessment & Plan Note (Signed)
Avoid offending foods, start probiotics. Do not eat large meals in late evening and consider raising head of bed. Continue Ranitidine

## 2015-08-20 NOTE — Assessment & Plan Note (Signed)
Started on Cefdinir, mucinex and probiotics

## 2015-08-21 ENCOUNTER — Telehealth: Payer: Self-pay | Admitting: Family Medicine

## 2015-08-21 ENCOUNTER — Other Ambulatory Visit: Payer: Self-pay | Admitting: Family Medicine

## 2015-08-21 ENCOUNTER — Other Ambulatory Visit (INDEPENDENT_AMBULATORY_CARE_PROVIDER_SITE_OTHER): Payer: Medicare HMO

## 2015-08-21 DIAGNOSIS — R030 Elevated blood-pressure reading, without diagnosis of hypertension: Secondary | ICD-10-CM | POA: Diagnosis not present

## 2015-08-21 DIAGNOSIS — R059 Cough, unspecified: Secondary | ICD-10-CM

## 2015-08-21 DIAGNOSIS — R05 Cough: Secondary | ICD-10-CM

## 2015-08-21 LAB — CBC WITH DIFFERENTIAL/PLATELET
Basophils Absolute: 0 10*3/uL (ref 0.0–0.1)
Basophils Relative: 0.1 % (ref 0.0–3.0)
EOS PCT: 1.1 % (ref 0.0–5.0)
Eosinophils Absolute: 0.2 10*3/uL (ref 0.0–0.7)
HEMATOCRIT: 48.5 % (ref 39.0–52.0)
Hemoglobin: 16.1 g/dL (ref 13.0–17.0)
LYMPHS ABS: 1.4 10*3/uL (ref 0.7–4.0)
LYMPHS PCT: 7.2 % — AB (ref 12.0–46.0)
MCHC: 33.1 g/dL (ref 30.0–36.0)
MCV: 91.1 fl (ref 78.0–100.0)
MONOS PCT: 10.1 % (ref 3.0–12.0)
Monocytes Absolute: 2 10*3/uL — ABNORMAL HIGH (ref 0.1–1.0)
NEUTROS ABS: 16.1 10*3/uL — AB (ref 1.4–7.7)
NEUTROS PCT: 81.5 % — AB (ref 43.0–77.0)
Platelets: 221 10*3/uL (ref 150.0–400.0)
RBC: 5.33 Mil/uL (ref 4.22–5.81)
RDW: 13.5 % (ref 11.5–15.5)
WBC: 19.8 10*3/uL (ref 4.0–10.5)

## 2015-08-21 LAB — BASIC METABOLIC PANEL
BUN: 23 mg/dL (ref 6–23)
CALCIUM: 9.2 mg/dL (ref 8.4–10.5)
CO2: 30 meq/L (ref 19–32)
CREATININE: 0.84 mg/dL (ref 0.40–1.50)
Chloride: 104 mEq/L (ref 96–112)
GFR: 95.19 mL/min (ref >60.00)
GLUCOSE: 78 mg/dL (ref 70–99)
Potassium: 4.4 mEq/L (ref 3.5–5.1)
SODIUM: 142 meq/L (ref 135–145)

## 2015-08-21 LAB — SEDIMENTATION RATE: Sed Rate: 16 mm/hr (ref 0–22)

## 2015-08-21 MED ORDER — PREDNISONE 20 MG PO TABS: 20.0000 mg | ORAL_TABLET | Freq: Two times a day (BID) | ORAL | Status: DC

## 2015-08-21 NOTE — Telephone Encounter (Signed)
High WBC. Likely related to prednisone use. He should report any concerning new symptoms and recheck cbc later this week.

## 2015-08-21 NOTE — Telephone Encounter (Signed)
Critical Lab Results White Count of 19.8

## 2015-08-22 ENCOUNTER — Other Ambulatory Visit: Payer: Self-pay | Admitting: Family Medicine

## 2015-08-22 DIAGNOSIS — D72829 Elevated white blood cell count, unspecified: Secondary | ICD-10-CM

## 2015-08-22 NOTE — Telephone Encounter (Signed)
Patient informed of results.  

## 2015-08-23 ENCOUNTER — Encounter (HOSPITAL_BASED_OUTPATIENT_CLINIC_OR_DEPARTMENT_OTHER): Payer: Self-pay

## 2015-08-23 ENCOUNTER — Ambulatory Visit (HOSPITAL_BASED_OUTPATIENT_CLINIC_OR_DEPARTMENT_OTHER)
Admission: RE | Admit: 2015-08-23 | Discharge: 2015-08-23 | Disposition: A | Discharge status: Home / Self Care | Payer: Medicare HMO | Source: Ambulatory Visit | Attending: Family Medicine | Admitting: Family Medicine

## 2015-08-23 DIAGNOSIS — I2510 Atherosclerotic heart disease of native coronary artery without angina pectoris: Secondary | ICD-10-CM | POA: Diagnosis not present

## 2015-08-23 DIAGNOSIS — D71 Functional disorders of polymorphonuclear neutrophils: Secondary | ICD-10-CM | POA: Insufficient documentation

## 2015-08-23 DIAGNOSIS — R05 Cough: Secondary | ICD-10-CM | POA: Diagnosis not present

## 2015-08-23 DIAGNOSIS — R911 Solitary pulmonary nodule: Secondary | ICD-10-CM | POA: Diagnosis not present

## 2015-08-23 DIAGNOSIS — R059 Cough, unspecified: Secondary | ICD-10-CM

## 2015-08-23 DIAGNOSIS — J984 Other disorders of lung: Secondary | ICD-10-CM

## 2015-08-23 LAB — QUANTIFERON TB GOLD ASSAY (BLOOD)
INTERFERON GAMMA RELEASE ASSAY: NEGATIVE
MITOGEN VALUE: 3.75 [IU]/mL
QUANTIFERON NIL VALUE: 0.03 [IU]/mL
QUANTIFERON TB AG MINUS NIL: 0 [IU]/mL
TB AG VALUE: 0.03 [IU]/mL

## 2015-08-23 MED ORDER — IOHEXOL 300 MG/ML  SOLN
75.0000 mL | Freq: Once | INTRAMUSCULAR | Status: AC | PRN
  Administered 2015-08-23: 75 mL via INTRAVENOUS

## 2015-08-25 ENCOUNTER — Other Ambulatory Visit (INDEPENDENT_AMBULATORY_CARE_PROVIDER_SITE_OTHER): Payer: Medicare HMO

## 2015-08-25 DIAGNOSIS — D72829 Elevated white blood cell count, unspecified: Secondary | ICD-10-CM

## 2015-08-25 LAB — CBC WITH DIFFERENTIAL/PLATELET
BASOS ABS: 0 10*3/uL (ref 0.0–0.1)
BASOS PCT: 0.1 % (ref 0.0–3.0)
EOS ABS: 0.3 10*3/uL (ref 0.0–0.7)
Eosinophils Relative: 3.6 % (ref 0.0–5.0)
HCT: 45.2 % (ref 39.0–52.0)
Hemoglobin: 15.1 g/dL (ref 13.0–17.0)
LYMPHS ABS: 1.4 10*3/uL (ref 0.7–4.0)
Lymphocytes Relative: 15.7 % (ref 12.0–46.0)
MCHC: 33.4 g/dL (ref 30.0–36.0)
MCV: 90.1 fl (ref 78.0–100.0)
Monocytes Absolute: 1.2 10*3/uL — ABNORMAL HIGH (ref 0.1–1.0)
Monocytes Relative: 12.7 % — ABNORMAL HIGH (ref 3.0–12.0)
NEUTROS ABS: 6.1 10*3/uL (ref 1.4–7.7)
NEUTROS PCT: 67.9 % (ref 43.0–77.0)
PLATELETS: 208 10*3/uL (ref 150.0–400.0)
RBC: 5.02 Mil/uL (ref 4.22–5.81)
RDW: 13.3 % (ref 11.5–15.5)
WBC: 9.1 10*3/uL (ref 4.0–10.5)

## 2015-11-07 ENCOUNTER — Encounter: Payer: Self-pay | Admitting: Family Medicine

## 2015-11-07 ENCOUNTER — Ambulatory Visit (INDEPENDENT_AMBULATORY_CARE_PROVIDER_SITE_OTHER): Payer: Medicare HMO | Admitting: Family Medicine

## 2015-11-07 VITALS — BP 130/82 | HR 71 | Temp 98.7°F | Ht 71.0 in | Wt 184.4 lb

## 2015-11-07 DIAGNOSIS — I4891 Unspecified atrial fibrillation: Secondary | ICD-10-CM | POA: Diagnosis not present

## 2015-11-07 DIAGNOSIS — E785 Hyperlipidemia, unspecified: Secondary | ICD-10-CM

## 2015-11-07 DIAGNOSIS — K219 Gastro-esophageal reflux disease without esophagitis: Secondary | ICD-10-CM

## 2015-11-07 DIAGNOSIS — R030 Elevated blood-pressure reading, without diagnosis of hypertension: Secondary | ICD-10-CM | POA: Diagnosis not present

## 2015-11-07 DIAGNOSIS — J209 Acute bronchitis, unspecified: Secondary | ICD-10-CM

## 2015-11-07 DIAGNOSIS — E782 Mixed hyperlipidemia: Secondary | ICD-10-CM

## 2015-11-07 DIAGNOSIS — IMO0001 Reserved for inherently not codable concepts without codable children: Secondary | ICD-10-CM

## 2015-11-07 LAB — COMPREHENSIVE METABOLIC PANEL
ALBUMIN: 4.1 g/dL (ref 3.5–5.2)
ALK PHOS: 71 U/L (ref 39–117)
ALT: 16 U/L (ref 0–53)
AST: 17 U/L (ref 0–37)
BILIRUBIN TOTAL: 0.8 mg/dL (ref 0.2–1.2)
BUN: 12 mg/dL (ref 6–23)
CALCIUM: 9.5 mg/dL (ref 8.4–10.5)
CO2: 30 meq/L (ref 19–32)
CREATININE: 0.78 mg/dL (ref 0.40–1.50)
Chloride: 106 mEq/L (ref 96–112)
GFR: 103.63 mL/min (ref >60.00)
Glucose, Bld: 96 mg/dL (ref 70–99)
Potassium: 4.7 mEq/L (ref 3.5–5.1)
Sodium: 139 mEq/L (ref 135–145)
TOTAL PROTEIN: 6.9 g/dL (ref 6.0–8.3)

## 2015-11-07 LAB — CBC
HEMATOCRIT: 43.7 % (ref 39.0–52.0)
HEMOGLOBIN: 15.2 g/dL (ref 13.0–17.0)
MCHC: 34.8 g/dL (ref 30.0–36.0)
MCV: 87.1 fl (ref 78.0–100.0)
PLATELETS: 202 10*3/uL (ref 150.0–400.0)
RBC: 5.02 Mil/uL (ref 4.22–5.81)
RDW: 13.2 % (ref 11.5–15.5)
WBC: 7.7 10*3/uL (ref 4.0–10.5)

## 2015-11-07 LAB — LIPID PANEL
CHOLESTEROL: 161 mg/dL (ref 0–200)
HDL: 49.5 mg/dL (ref >39.00)
LDL CALC: 90 mg/dL (ref 0–99)
NonHDL: 111.64
Total CHOL/HDL Ratio: 3
Triglycerides: 106 mg/dL (ref 0.0–149.0)
VLDL: 21.2 mg/dL (ref 0.0–40.0)

## 2015-11-07 LAB — TSH: TSH: 1.32 u[IU]/mL (ref 0.35–4.50)

## 2015-11-07 NOTE — Progress Notes (Signed)
Pre visit review using our clinic review tool, if applicable. No additional management support is needed unless otherwise documented below in the visit note. 

## 2015-11-07 NOTE — Patient Instructions (Signed)

## 2015-11-08 ENCOUNTER — Encounter: Payer: Self-pay | Admitting: General Practice

## 2015-11-12 ENCOUNTER — Encounter: Payer: Self-pay | Admitting: Family Medicine

## 2015-11-12 NOTE — Assessment & Plan Note (Signed)
Finally cough has resolved.

## 2015-11-12 NOTE — Assessment & Plan Note (Signed)
Encouraged heart healthy diet, increase exercise, avoid trans fats, consider a krill oil cap daily 

## 2015-11-12 NOTE — Assessment & Plan Note (Signed)
Tolerating Xarelto, no new concerns.

## 2015-11-12 NOTE — Assessment & Plan Note (Signed)
Well controlled. Encouraged heart healthy diet such as the DASH diet and exercise as tolerated.  

## 2015-11-12 NOTE — Assessment & Plan Note (Signed)
Avoid offending foods, start probiotics. Do not eat large meals in late evening and consider raising head of bed.  

## 2015-11-12 NOTE — Progress Notes (Signed)
Patient ID: Francis Shaw, male   DOB: 1941-08-22, 75 y.o.   MRN: [id number]   Subjective:    Patient ID: Francis Shaw, male    DOB: 03-08-1941, 75 y.o.   MRN: [id number]  Chief Complaint  Patient presents with  . Follow-up    HPI Patient is in today for follow up. After several months his cough has finally resolved. He reports no residual concerns. No new or acute concerns. No recent hospitalization. Denies CP/palp/SOB/HA/congestion/fevers/GI or GU c/o. Taking meds as prescribed  Past Medical History  Diagnosis Date  . Skin lesion 11/26/2011  . Dermatitis 11/22/2012  . Esophageal reflux 04/12/2013  . Pericarditis   . Atrial flutter ([place])   . Atrial fibrillation ([place])     a. Eliquis initiated 04/2013.  Marland Kitchen Nonischemic cardiomyopathy ([place])     a. presumed to be tachy mediated;  b. 03/2013 Echo: EF 30-35%, diff HK, mild LVH, mildly dil LA.    Past Surgical History  Procedure Laterality Date  . Calcaneal osteotomy Right 1993    "stapled it; screwed it" (04/28/2013)  . 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 Tonsillectomy  1940's  . Calcaneus hardware removal Right ~ 1994  . Left heart catheterization with coronary angiogram N/A 09/30/2013    Procedure: LEFT HEART CATHETERIZATION WITH CORONARY ANGIOGRAM;  Surgeon: Burnell Blanks, MD;  Location: [facility] CATH LAB;  Service: Cardiovascular;  Laterality: N/A;    Family History  Problem Relation Age of Onset  . Stroke Father   . Asthma Mother     low back pain    Social History   Social History  . Marital Status: Married    Spouse Name: N/A  . Number of Children: 2  . Years of Education: N/A   Occupational History  . Not on file.   Social History Main Topics  . Smoking status: Former Smoker -- 1.00 packs/day for 18 years    Types: Cigarettes    Quit date: 09/24/1979  . Smokeless tobacco: Never Used  . Alcohol Use: 0.0 oz/week    0 Standard drinks or equivalent per week     Comment: occasionally   . Drug Use:  No  . Sexual Activity:    Partners: Female   Other Topics Concern  . Not on file   Social History Narrative    Outpatient Prescriptions Prior to Visit  Medication Sig Dispense Refill  . acetaminophen (TYLENOL) 500 MG tablet Take 1,000 mg by mouth every 6 (six) hours as needed for mild pain or headache.    . carvedilol (COREG) 12.5 MG tablet TAKE 1 TABLET BY MOUTH TWICE DAILY WITH A MEAL 180 tablet 3  . fluticasone (FLONASE) 50 MCG/ACT nasal spray Place 2 sprays into both nostrils daily. 16 g 3  . losartan (COZAAR) 25 MG tablet TAKE 1 TABLET BY MOUTH EVERY DAY 90 tablet 1  . Multiple Vitamins-Minerals (CENTRUM SILVER PO) Take 1 tablet by mouth daily.    Alveda Reasons 20 MG TABS tablet TAKE 1 TABLET BY MOUTH EVERY DAY WITH DINNER 30 tablet 5  . albuterol (PROVENTIL HFA;VENTOLIN HFA) 108 (90 BASE) MCG/ACT inhaler Inhale 2 puffs into the lungs every 6 (six) hours as needed for wheezing or shortness of breath. (Patient not taking: Reported on 08/15/2015) 1 Inhaler 0  . beclomethasone (QVAR) 40 MCG/ACT inhaler Inhale 2 puffs into the lungs 2 (two) times daily. (Patient not taking: Reported on 08/15/2015) 1 Inhaler 2  . cetirizine (ZYRTEC) 10 MG  tablet Take 1 tablet (10 mg total) by mouth daily as needed for allergies. (Patient not taking: Reported on 11/07/2015) 30 tablet 1  . clobetasol cream (TEMOVATE) [id number] % Apply 1 application topically 2 (two) times daily as needed (anal itch). Reported on 11/07/2015    . loratadine (CLARITIN) 10 MG tablet Take 1 tablet (10 mg total) by mouth daily. (Patient not taking: Reported on 08/15/2015) 30 tablet 0  . HYDROcodone-homatropine (HYCODAN) 5-1.5 MG/5ML syrup Take 5 mLs by mouth every 6 (six) hours as needed for cough. 120 mL 0  . predniSONE (DELTASONE) 20 MG tablet Take 1 tablet (20 mg total) by mouth 2 (two) times daily with a meal. 10 tablet 0  . ranitidine (ZANTAC) 150 MG tablet Take 1 tablet (150 mg total) by mouth 2 (two) times daily as needed for heartburn.  60 tablet 1   No facility-administered medications prior to visit.    No Known Allergies  Review of Systems  Constitutional: Negative for fever and malaise/fatigue.  HENT: Negative for congestion.   Eyes: Negative for discharge.  Respiratory: Negative for shortness of breath.   Cardiovascular: Negative for chest pain, palpitations and leg swelling.  Gastrointestinal: Negative for nausea and abdominal pain.  Genitourinary: Negative for dysuria.  Musculoskeletal: Negative for falls.  Skin: Negative for rash.  Neurological: Negative for loss of consciousness and headaches.  Endo/Heme/Allergies: Negative for environmental allergies.  Psychiatric/Behavioral: Negative for depression. The patient is not nervous/anxious.        Objective:    Physical Exam  Constitutional: He is oriented to person, place, and time. He appears well-developed and well-nourished. No distress.  HENT:  Head: Normocephalic and atraumatic.  Nose: Nose normal.  Eyes: Right eye exhibits no discharge. Left eye exhibits no discharge.  Neck: Normal range of motion. Neck supple.  Cardiovascular: Normal rate.   Pulmonary/Chest: Effort normal and breath sounds normal.  Abdominal: Soft. Bowel sounds are normal. There is no tenderness.  Musculoskeletal: He exhibits no edema.  Neurological: He is alert and oriented to person, place, and time.  Skin: Skin is warm and dry.  Psychiatric: He has a normal mood and affect.  Nursing note and vitals reviewed.   BP 130/82 mmHg  Pulse 71  Temp(Src) 98.7 F (37.1 C) (Oral)  Ht 5\' 11"  (1.803 m)  Wt 184 lb 6 oz (83.632 kg)  BMI 25.73 kg/m2  SpO2 96% Wt Readings from Last 3 Encounters:  11/07/15 184 lb 6 oz (83.632 kg)  08/15/15 183 lb (83.008 kg)  05/24/15 177 lb (80.287 kg)     Lab Results  Component Value Date   WBC 7.7 11/07/2015   HGB 15.2 11/07/2015   HCT 43.7 11/07/2015   PLT 202.0 11/07/2015   GLUCOSE 96 11/07/2015   CHOL 161 11/07/2015   TRIG 106.0  11/07/2015   HDL 49.50 11/07/2015   LDLCALC 90 11/07/2015   ALT 16 11/07/2015   AST 17 11/07/2015   NA 139 11/07/2015   K 4.7 11/07/2015   CL 106 11/07/2015   CREATININE 0.78 11/07/2015   BUN 12 11/07/2015   CO2 30 11/07/2015   TSH 1.32 11/07/2015   PSA 2.28 11/26/2011   INR 1.26 09/27/2013   HGBA1C 5.2 04/29/2013    Lab Results  Component Value Date   TSH 1.32 11/07/2015   Lab Results  Component Value Date   WBC 7.7 11/07/2015   HGB 15.2 11/07/2015   HCT 43.7 11/07/2015   MCV 87.1 11/07/2015   PLT 202.0 11/07/2015  Lab Results  Component Value Date   NA 139 11/07/2015   K 4.7 11/07/2015   CO2 30 11/07/2015   GLUCOSE 96 11/07/2015   BUN 12 11/07/2015   CREATININE 0.78 11/07/2015   BILITOT 0.8 11/07/2015   ALKPHOS 71 11/07/2015   AST 17 11/07/2015   ALT 16 11/07/2015   PROT 6.9 11/07/2015   ALBUMIN 4.1 11/07/2015   CALCIUM 9.5 11/07/2015   GFR 103.63 11/07/2015   Lab Results  Component Value Date   CHOL 161 11/07/2015   Lab Results  Component Value Date   HDL 49.50 11/07/2015   Lab Results  Component Value Date   LDLCALC 90 11/07/2015   Lab Results  Component Value Date   TRIG 106.0 11/07/2015   Lab Results  Component Value Date   CHOLHDL 3 11/07/2015   Lab Results  Component Value Date   HGBA1C 5.2 04/29/2013       Assessment & Plan:   Problem List Items Addressed This Visit    Acute bronchitis    Finally cough has resolved.       Atrial fibrillation (HCC)    Tolerating Xarelto, no new concerns.      Elevated BP    Well controlled. Encouraged heart healthy diet such as the DASH diet and exercise as tolerated.       Esophageal reflux    Avoid offending foods, start probiotics. Do not eat large meals in late evening and consider raising head of bed.       Hyperlipidemia    Encouraged heart healthy diet, increase exercise, avoid trans fats, consider a krill oil cap daily       Other Visit Diagnoses    Hyperlipidemia, mixed     -  Primary    Relevant Orders    Lipid panel (Completed)    CBC (Completed)    TSH (Completed)    Comprehensive metabolic panel (Completed)    Elevated blood pressure        Relevant Orders    Lipid panel (Completed)    CBC (Completed)    TSH (Completed)    Comprehensive metabolic panel (Completed)       I have discontinued Mr. Maners's HYDROcodone-homatropine, ranitidine, and predniSONE. I am also having him maintain his Multiple Vitamins-Minerals (CENTRUM SILVER PO), clobetasol cream, acetaminophen, carvedilol, fluticasone, albuterol, beclomethasone, loratadine, losartan, XARELTO, and cetirizine.  No orders of the defined types were placed in this encounter.     Penni Homans, MD

## 2015-11-22 DIAGNOSIS — H2513 Age-related nuclear cataract, bilateral: Secondary | ICD-10-CM | POA: Diagnosis not present

## 2015-11-30 ENCOUNTER — Encounter: Payer: Self-pay | Admitting: Family Medicine

## 2015-12-07 ENCOUNTER — Other Ambulatory Visit: Payer: Self-pay | Admitting: Family Medicine

## 2015-12-07 ENCOUNTER — Other Ambulatory Visit: Payer: Self-pay | Admitting: Medical

## 2015-12-07 ENCOUNTER — Encounter: Payer: Self-pay | Admitting: Family Medicine

## 2015-12-07 DIAGNOSIS — R05 Cough: Secondary | ICD-10-CM

## 2015-12-07 DIAGNOSIS — R059 Cough, unspecified: Secondary | ICD-10-CM

## 2015-12-07 MED ORDER — CETIRIZINE HCL 10 MG PO TABS: 10.0000 mg | ORAL_TABLET | Freq: Every day | ORAL | Status: DC | PRN

## 2015-12-07 MED ORDER — LORATADINE 10 MG PO TABS: 10.0000 mg | ORAL_TABLET | Freq: Every day | ORAL | Status: DC

## 2015-12-07 MED ORDER — CLOBETASOL PROPIONATE 0.05 % EX CREA: 1.0000 "application " | TOPICAL_CREAM | Freq: Two times a day (BID) | CUTANEOUS | Status: DC | PRN

## 2015-12-12 ENCOUNTER — Other Ambulatory Visit: Payer: Self-pay | Admitting: Cardiology

## 2015-12-12 NOTE — Telephone Encounter (Signed)
Rx(s) sent to pharmacy electronically.  

## 2016-01-01 ENCOUNTER — Encounter: Payer: Self-pay | Admitting: Family Medicine

## 2016-01-02 NOTE — Telephone Encounter (Signed)
Cologuard ordered via [place] confirmation #: [id number]. Sent for scanning.

## 2016-01-03 ENCOUNTER — Other Ambulatory Visit: Payer: Self-pay | Admitting: Cardiology

## 2016-01-05 DIAGNOSIS — Z1211 Encounter for screening for malignant neoplasm of colon: Secondary | ICD-10-CM | POA: Diagnosis not present

## 2016-01-05 LAB — COLOGUARD: COLOGUARD: POSITIVE

## 2016-01-18 ENCOUNTER — Telehealth: Payer: Self-pay | Admitting: Family Medicine

## 2016-01-18 NOTE — Telephone Encounter (Signed)
Brianna from [employer] called in to confirm receipt of pt's collar guard that was sent electronically. If any concerns please call   Phone: [phone]

## 2016-01-18 NOTE — Telephone Encounter (Signed)
Let patient know screen was positive and I will refer to gastroenterology unless he objects

## 2016-01-18 NOTE — Telephone Encounter (Signed)
Called the patient informed Cologuard was positive and that PCP has referred him to GI. The patient verbally understood/agreed to results/referral. Abstracted cologuard results in chart.

## 2016-01-18 NOTE — Telephone Encounter (Signed)
Spoke to [employer] [facility] Sharyn Lull)  and the patients results were  [place], they will refax results to [place].

## 2016-02-02 ENCOUNTER — Encounter: Payer: Self-pay | Admitting: Gastroenterology

## 2016-02-02 ENCOUNTER — Encounter: Payer: Self-pay | Admitting: Family Medicine

## 2016-02-02 ENCOUNTER — Other Ambulatory Visit: Payer: Self-pay | Admitting: Family Medicine

## 2016-02-02 DIAGNOSIS — K921 Melena: Secondary | ICD-10-CM

## 2016-03-11 ENCOUNTER — Other Ambulatory Visit: Payer: Self-pay | Admitting: *Deleted

## 2016-03-11 MED ORDER — LOSARTAN POTASSIUM 25 MG PO TABS: 25.0000 mg | ORAL_TABLET | Freq: Every day | ORAL | Status: DC

## 2016-03-18 ENCOUNTER — Telehealth: Payer: Self-pay | Admitting: Cardiology

## 2016-03-18 MED ORDER — LOSARTAN POTASSIUM 25 MG PO TABS: 25.0000 mg | ORAL_TABLET | Freq: Every day | ORAL | Status: DC

## 2016-03-18 NOTE — Telephone Encounter (Signed)
Pt now has an appt  with [facility] 05-29-16-needs refill of Los[place]

## 2016-03-25 ENCOUNTER — Other Ambulatory Visit: Payer: Self-pay | Admitting: *Deleted

## 2016-03-25 MED ORDER — CARVEDILOL 12.5 MG PO TABS: 12.5000 mg | ORAL_TABLET | Freq: Two times a day (BID) | ORAL | Status: DC

## 2016-03-27 ENCOUNTER — Other Ambulatory Visit: Payer: Self-pay

## 2016-04-03 ENCOUNTER — Telehealth: Payer: Self-pay

## 2016-04-03 ENCOUNTER — Ambulatory Visit (INDEPENDENT_AMBULATORY_CARE_PROVIDER_SITE_OTHER): Payer: Medicare HMO | Admitting: Gastroenterology

## 2016-04-03 ENCOUNTER — Encounter: Payer: Self-pay | Admitting: Gastroenterology

## 2016-04-03 VITALS — BP 120/60 | HR 72 | Ht 69.0 in | Wt 179.4 lb

## 2016-04-03 DIAGNOSIS — Z7901 Long term (current) use of anticoagulants: Secondary | ICD-10-CM

## 2016-04-03 DIAGNOSIS — R195 Other fecal abnormalities: Secondary | ICD-10-CM

## 2016-04-03 MED ORDER — NA SULFATE-K SULFATE-MG SULF 17.5-3.13-1.6 GM/177ML PO SOLN: 1.0000 | Freq: Once | ORAL | Status: DC

## 2016-04-03 NOTE — Patient Instructions (Signed)
You have been scheduled for a colonoscopy. Please follow written instructions given to you at your visit today.  Please pick up your prep supplies at the pharmacy within the next 1-3 days. If you use inhalers (even only as needed), please bring them with you on the day of your procedure. Your physician has requested that you go to www.startemmi.com and enter the access code given to you at your visit today. This web site gives a general overview about your procedure. However, you should still follow specific instructions given to you by our office regarding your preparation for the procedure.  You will be contaced by our office prior to your procedure for directions on holding your Xarelto.  If you do not hear from our office 1 week prior to your scheduled procedure, please call [phone] to discuss.  Thank you for choosing me and [facility].  Pricilla Riffle. Dagoberto Ligas., MD., [facility] Regal

## 2016-04-03 NOTE — Progress Notes (Signed)
History of Present Illness: This is a 75 year old male referred by Mosie Lukes, MD for the evaluation of a positive Cologuard. CBC, CMP in 10/2015 were normal. States he had a colonoscopy performed over 10 years ago that was normal however he does not recall where was performed to perform the exam. There is no record in Epic. He is maintained on Xarelto for atrial fibrillation. Denies weight loss, abdominal pain, constipation, diarrhea, change in stool caliber, melena, hematochezia, nausea, vomiting, dysphagia, reflux symptoms, chest pain.  No Known Allergies Outpatient Prescriptions Prior to Visit  Medication Sig Dispense Refill  . acetaminophen (TYLENOL) 500 MG tablet Take 1,000 mg by mouth every 6 (six) hours as needed for mild pain or headache.    . carvedilol (COREG) 12.5 MG tablet Take 1 tablet (12.5 mg total) by mouth 2 (two) times daily with a meal. 180 tablet 3  . cetirizine (ZYRTEC) 10 MG tablet Take 1 tablet (10 mg total) by mouth daily as needed for allergies. 30 tablet 1  . clobetasol cream (TEMOVATE) AB-123456789 % Apply 1 application topically 2 (two) times daily as needed (anal itch). Reported on 11/07/2015 30 g 0  . fluticasone (FLONASE) 50 MCG/ACT nasal spray Place 2 sprays into both nostrils daily. 16 g 3  . loratadine (CLARITIN) 10 MG tablet Take 1 tablet (10 mg total) by mouth daily. 30 tablet 0  . losartan (COZAAR) 25 MG tablet Take 1 tablet (25 mg total) by mouth daily. 90 tablet 1  . Multiple Vitamins-Minerals (CENTRUM SILVER PO) Take 1 tablet by mouth daily.    Alveda Reasons 20 MG TABS tablet TAKE 1 TABLET BY MOUTH EVERY DAY WITH DINNER 30 tablet 6  . albuterol (PROVENTIL HFA;VENTOLIN HFA) 108 (90 BASE) MCG/ACT inhaler Inhale 2 puffs into the lungs every 6 (six) hours as needed for wheezing or shortness of breath. (Patient not taking: Reported on 04/03/2016) 1 Inhaler 0  . beclomethasone (QVAR) 40 MCG/ACT inhaler Inhale 2 puffs into the lungs 2 (two) times daily. (Patient not  taking: Reported on 04/03/2016) 1 Inhaler 2   No facility-administered medications prior to visit.   Past Medical History  Diagnosis Date  . Skin lesion 11/26/2011  . Dermatitis 11/22/2012  . Esophageal reflux 04/12/2013  . Pericarditis   . Atrial flutter ([place])   . Atrial fibrillation ([place])     a. Eliquis initiated 04/2013.  Marland Kitchen Nonischemic cardiomyopathy ([place])     a. presumed to be tachy mediated;  b. 03/2013 Echo: EF 30-35%, diff HK, mild LVH, mildly dil LA.   Past Surgical History  Procedure Laterality Date  . Calcaneal osteotomy Right 1993    "stapled it; screwed it" (04/28/2013)  . 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 Tonsillectomy  1940's  . Calcaneus hardware removal Right ~ 1994  . Left heart catheterization with coronary angiogram N/A 09/30/2013    Procedure: LEFT HEART CATHETERIZATION WITH CORONARY ANGIOGRAM;  Surgeon: Burnell Blanks, MD;  Location: [facility] CATH LAB;  Service: Cardiovascular;  Laterality: N/A;   Social History   Social History  . Marital Status: Married    Spouse Name: N/A  . Number of Children: 2  . Years of Education: N/A   Occupational History  . self emp    Social History Main Topics  . Smoking status: Former Smoker -- 1.00 packs/day for 18 years    Types: Cigarettes    Quit date: 09/24/1979  . Smokeless tobacco: Never Used  .  Alcohol Use: 0.0 oz/week    0 Standard drinks or equivalent per week     Comment: occasionally   . Drug Use: No  . Sexual Activity:    Partners: Female   Other Topics Concern  . None   Social History Narrative   Family History  Problem Relation Age of Onset  . Stroke Father   . Asthma Mother     low back pain      Review of Systems: Pertinent positive and negative review of systems were noted in the above HPI section. All other review of systems were otherwise negative.   Physical Exam: General: Well developed, well nourished, no acute distress Head: Normocephalic and atraumatic Eyes:   sclerae anicteric, EOMI Ears: Normal auditory acuity Mouth: No deformity or lesions Neck: Supple, no masses or thyromegaly Lungs: Clear throughout to auscultation Heart: Regular rate and rhythm; no murmurs, rubs or bruits Abdomen: Soft, non tender and non distended. No masses, hepatosplenomegaly or hernias noted. Normal Bowel sounds Rectal: deferred to colonoscopy Musculoskeletal: Symmetrical with no gross deformities  Skin: No lesions on visible extremities Pulses:  Normal pulses noted Extremities: No clubbing, cyanosis, edema or deformities noted Neurological: Alert oriented x 4, grossly nonfocal Cervical Nodes:  No significant cervical adenopathy Inguinal Nodes: No significant inguinal adenopathy Psychological:  Alert and cooperative. Normal mood and affect  Assessment and Recommendations:  1. Positive Cologuard. R/O colorectal neoplasms. Schedule colonoscopy. The risks (including bleeding, perforation, infection, missed lesions, medication reactions and possible hospitalization or surgery if complications occur), benefits, and alternatives to colonoscopy with possible biopsy and possible polypectomy were discussed with the patient and they consent to proceed.    2. Hold Xarelto 2 days before procedure - will instruct when and how to resume after procedure. Low but real risk of cardiovascular event such as heart attack, stroke, embolism, thrombosis or ischemia/infarct of other organs off Xarelto explained and need to seek urgent help if this occurs. The patient consents to proceed. Will communicate by phone or EMR with patient's prescribing provider to confirm that holding Xarelto is reasonable in this case.    cc: Mosie Lukes, MD [address] [place], [place] [address]

## 2016-04-03 NOTE — Telephone Encounter (Signed)
  04/03/2016   RE: SAMYOG MOULTRIE DOB: [birth year] MRN: [id number]   Dear Dr. Charlett Blake,    We have scheduled the above patient for an endoscopic procedure. Our records show that he is on anticoagulation therapy.   Please advise as to how long the patient may come off his therapy of Xarelto prior to the procedure, which is scheduled for 05/21/16.  Please route your answer to Marlon Pel, CMA  Sincerely,    Marlon Pel, CMA

## 2016-04-03 NOTE — Telephone Encounter (Signed)
Please let him know He is overdue for a cardiology visit and I think he needs cardiac clearance. I will try and get him a sooner appt with cardiology for clearance if he agrees.

## 2016-04-04 NOTE — Telephone Encounter (Signed)
Called the patient and he has other appts coming up as well as going to be out of town some too.  He would rather keep cardiology appt. Where it is at this time.

## 2016-04-11 ENCOUNTER — Other Ambulatory Visit: Payer: Self-pay | Admitting: *Deleted

## 2016-05-01 ENCOUNTER — Ambulatory Visit: Payer: Medicare HMO | Admitting: *Deleted

## 2016-05-10 ENCOUNTER — Encounter: Payer: Self-pay | Admitting: Gastroenterology

## 2016-05-16 ENCOUNTER — Encounter: Payer: Self-pay | Admitting: Cardiology

## 2016-05-20 ENCOUNTER — Telehealth: Payer: Self-pay | Admitting: *Deleted

## 2016-05-20 ENCOUNTER — Telehealth: Payer: Self-pay | Admitting: Cardiology

## 2016-05-20 NOTE — Telephone Encounter (Signed)
Returned call to Fay at [facility] of [facility] clearance and recommendations regarding Xarelto. Reports she does not need it faxed that she can access it in the computer.

## 2016-05-20 NOTE — Telephone Encounter (Signed)
Request for surgical clearance:  1. What type of surgery is being performed? Colonoscopy   2. When is this surgery scheduled? 05/20/2016 @ 2pm  Are there any medications that need to be held prior to surgery and how long?  Xarelto, last taken 05/20/2016  Name of physician performing surgery? Dr.Stark What is your office phone and fax number   ?[phone] and fax [phone]

## 2016-05-20 NOTE — Telephone Encounter (Signed)
During reminder calls to patients, Francis Shaw reached Francis Shaw and he stated that he had not held his Xarelto.  I phoned Francis Shaw and he states that he took his Xarelto last night 8/27.  Per patients chart, he needs to see his cardiologist for clearance.  Spoke with Marlon Pel, CMA and she will call to patient to reschedule after his Cardiology appointment.

## 2016-05-20 NOTE — Telephone Encounter (Signed)
See other phone note from Cardiology. Per Dr. Stanford Breed patient can hold Xarelto for procedure tomorrow.  Per Dr. Fuller Plan and Dr. Stanford Breed can proceed with Colonoscopy tomorrow. Informed patient that will continue to hold until after procedure. Patient verbalized understanding.

## 2016-05-20 NOTE — Telephone Encounter (Signed)
Error: patient is a current  Patient of Dr.Crenshaw in [place] resending to [facility] for surgical clearance.

## 2016-05-20 NOTE — Telephone Encounter (Signed)
Called and spoke with patient and explained to him that I sent the clearance note to Dr. Randel Pigg in July and she responded to the note stating patient needed follow up with Cardiology. Also the note stated the patient decided to keep the Cardiology appt he already had and then phone note was closed with no other response from there office. Patient states he did not take his Xarelto yesterday. I informed patient that I may not be able to get the clearance with this short notice but I can call Cardiology and get Dr. Stanford Breed or another Cardiologist to review his chart and decided. Told patient I will call him back as soon as I get an answer. Patient verbalized understanding.

## 2016-05-20 NOTE — Telephone Encounter (Signed)
Hold xarelto 2 days prior to procedure and resume after when ok with GI. Kirk Ruths

## 2016-05-21 ENCOUNTER — Encounter: Payer: Self-pay | Admitting: Gastroenterology

## 2016-05-21 ENCOUNTER — Ambulatory Visit (AMBULATORY_SURGERY_CENTER): Payer: Medicare HMO | Admitting: Gastroenterology

## 2016-05-21 VITALS — BP 118/65 | HR 77 | Temp 98.4°F | Resp 18 | Ht 69.0 in | Wt 179.0 lb

## 2016-05-21 DIAGNOSIS — R195 Other fecal abnormalities: Secondary | ICD-10-CM

## 2016-05-21 DIAGNOSIS — D125 Benign neoplasm of sigmoid colon: Secondary | ICD-10-CM

## 2016-05-21 DIAGNOSIS — I4891 Unspecified atrial fibrillation: Secondary | ICD-10-CM | POA: Diagnosis not present

## 2016-05-21 DIAGNOSIS — I4892 Unspecified atrial flutter: Secondary | ICD-10-CM | POA: Diagnosis not present

## 2016-05-21 MED ORDER — SODIUM CHLORIDE 0.9 % IV SOLN: 500.0000 mL | INTRAVENOUS | Status: DC

## 2016-05-21 NOTE — Progress Notes (Signed)
Pt last took Xarelto 05-19-16 at 1800.  See T.E. 05-20-16

## 2016-05-21 NOTE — Progress Notes (Signed)
Patient awakening,vss,report to rn 

## 2016-05-21 NOTE — Patient Instructions (Signed)
YOU HAD AN ENDOSCOPIC PROCEDURE TODAY AT [facility]:   Refer to the procedure report that was given to you for any specific questions about what was found during the examination.  If the procedure report does not answer your questions, please call your gastroenterologist to clarify.  If you requested that your care partner not be given the details of your procedure findings, then the procedure report has been included in a sealed envelope for you to review at your convenience later.  YOU SHOULD EXPECT: Some feelings of bloating in the abdomen. Passage of more gas than usual.  Walking can help get rid of the air that was put into your GI tract during the procedure and reduce the bloating. If you had a lower endoscopy (such as a colonoscopy or flexible sigmoidoscopy) you may notice spotting of blood in your stool or on the toilet paper. If you underwent a bowel prep for your procedure, you may not have a normal bowel movement for a few days.  Please Note:  You might notice some irritation and congestion in your nose or some drainage.  This is from the oxygen used during your procedure.  There is no need for concern and it should clear up in a day or so.  SYMPTOMS TO REPORT IMMEDIATELY:   Following lower endoscopy (colonoscopy or flexible sigmoidoscopy):  Excessive amounts of blood in the stool  Significant tenderness or worsening of abdominal pains  Swelling of the abdomen that is new, acute  Fever of 100F or higher   For urgent or emergent issues, a gastroenterologist can be reached at any hour by calling [phone].   DIET:  We do recommend a small meal at first, but then you may proceed to your regular diet.  Drink plenty of fluids but you should avoid alcoholic beverages for 24 hours.  ACTIVITY:  You should plan to take it easy for the rest of today and you should NOT DRIVE or use heavy machinery until tomorrow (because of the sedation medicines used during the test).     Please read all handouts given to you by your recovery RN.  FOLLOW UP: Our staff will call the number listed on your records the next business day following your procedure to check on you and address any questions or concerns that you may have regarding the information given to you following your procedure. If we do not reach you, we will leave a message.  However, if you are feeling well and you are not experiencing any problems, there is no need to return our call.  We will assume that you have returned to your regular daily activities without incident.  If any biopsies were taken you will be contacted by phone or by letter within the next 1-3 weeks.  Please call us at [phone] if you have not heard about the biopsies in 3 weeks.    SIGNATURES/CONFIDENTIALITY: You and/or your care partner have signed paperwork which will be entered into your electronic medical record.  These signatures attest to the fact that that the information above on your After Visit Summary has been reviewed and is understood.  Full responsibility of the confidentiality of this discharge information lies with you and/or your care-partner.  Thank you for letting us take care of your healthcare needs today.

## 2016-05-21 NOTE — Op Note (Signed)
[name] Procedure Date: 05/21/2016 2:14 PM MRN: [id number] Endoscopist: Ladene Artist , MD Age: 75 Referring MD:  Date of Birth: 27-Dec-1940 Gender: Male Account #: [id number] Procedure:                Colonoscopy Indications:              Positive Cologuard test Medicines:                Monitored Anesthesia Care Procedure:                Pre-Anesthesia Assessment:                           - Prior to the procedure, a History and Physical                            was performed, and patient medications and                            allergies were reviewed. The patient's tolerance of                            previous anesthesia was also reviewed. The risks                            and benefits of the procedure and the sedation                            options and risks were discussed with the patient.                            All questions were answered, and informed consent                            was obtained. Prior Anticoagulants: The patient has                            taken Xarelto (rivaroxaban), last dose was 2 days                            prior to procedure. ASA Grade Assessment: III - A                            patient with severe systemic disease. After                            reviewing the risks and benefits, the patient was                            deemed in satisfactory condition to undergo the                            procedure.  After obtaining informed consent, the colonoscope                            was passed under direct vision. Throughout the                            procedure, the patient's blood pressure, pulse, and                            oxygen saturations were monitored continuously. The                            Model PCF-H190DL [id number]) scope was introduced                            through the anus and advanced to the the cecum,                             identified by appendiceal orifice and ileocecal                            valve. The ileocecal valve, appendiceal orifice,                            and rectum were photographed. The quality of the                            bowel preparation was good. The colonoscopy was                            performed without difficulty. The patient tolerated                            the procedure well. Scope In: 2:19:44 PM Scope Out: 2:34:00 PM Scope Withdrawal Time: 0 hours 11 minutes 57 seconds  Total Procedure Duration: 0 hours 14 minutes 16 seconds  Findings:                 A 12 mm polyp was found in the sigmoid colon. The                            polyp was pedunculated. The polyp was removed with                            a hot snare. Resection and retrieval were complete.                           Multiple small-mouthed diverticula were found in                            the sigmoid colon and descending colon. There was                            no evidence of diverticular bleeding.  A few small-mouthed diverticula were found in the                            transverse colon. There was no evidence of                            diverticular bleeding.                           Internal hemorrhoids were found during                            retroflexion. The hemorrhoids were small and Grade                            I (internal hemorrhoids that do not prolapse).                           The exam was otherwise normal throughout the                            examined colon. Complications:            No immediate complications. Estimated blood loss:                            None. Estimated Blood Loss:     Estimated blood loss: none. Impression:               - One 12 mm polyp in the sigmoid colon, removed                            with a hot snare. Resected and retrieved.                           - Moderate diverticulosis in the sigmoid colon and                             in the descending colon.                           - Mild diverticulosis in the transverse colon.                           - Internal hemorrhoids. Recommendation:           - Repeat colonoscopy in 3 years for surveillance                            pending pathology review.                           - Resume Xarelto (rivaroxaban) in 2 days at prior                            dose. Refer to managing physician for further  adjustment of therapy.                           - Patient has a contact number available for                            emergencies. The signs and symptoms of potential                            delayed complications were discussed with the                            patient. Return to normal activities tomorrow.                            Written discharge instructions were provided to the                            patient.                           - Resume previous diet.                           - Continue present medications.                           - Await pathology results.                           - No aspirin, ibuprofen, naproxen, or other                            non-steroidal anti-inflammatory drugs for 2 weeks                            after polyp removal. Ladene Artist, MD 05/21/2016 2:39:27 PM This report has been signed electronically.

## 2016-05-22 ENCOUNTER — Telehealth: Payer: Self-pay

## 2016-05-22 NOTE — Telephone Encounter (Signed)
  Follow up Call-  Call back number 05/21/2016  Post procedure Call Back phone  # [phone]  Permission to leave phone message Yes  Some recent data might be hidden     Patient questions:  Do you have a fever, pain , or abdominal swelling? No. Pain Score  0 *  Have you tolerated food without any problems? Yes.    Have you been able to return to your normal activities? Yes.    Do you have any questions about your discharge instructions: Diet   No. Medications  No. Follow up visit  No.  Do you have questions or concerns about your Care? No.  Actions: * If pain score is 4 or above: No action needed, pain <4.

## 2016-05-25 NOTE — Progress Notes (Signed)
HPI: FU atrial fibrillation/atrial flutter. Echocardiogram in July of 2014 showed moderate left ventricular enlargement, mild left ventricular hypertrophy and an ejection fraction of 30-35% with diffuse hypokinesis. There was mild left atrial enlargement. TSH in July of 2014 normal. When I initially saw the patient in August of 2014 he was in atrial flutter at a rate of 150 (also with atrial fibrillation on telemetry and flutter ablation not pursued). Patient was scheduled for elective cardioversion but when he came to the procedure he was in sinus rhythm. Cardiac catheterization January 2015 showed no obstructive coronary disease and ejection fraction 35%. Last echo 2/16 showed EF 40-45, grade 1 DD, mild LAE. Abd ultrasound 2/16 showed no aneurysm. Since he was last seen, the patient has dyspnea with more extreme activities but not with routine activities. It is relieved with rest. It is not associated with chest pain. There is no orthopnea, PND or pedal edema. There is no syncope or palpitations. There is no exertional chest pain.   Current Outpatient Prescriptions  Medication Sig Dispense Refill  . acetaminophen (TYLENOL) 500 MG tablet Take 1,000 mg by mouth every 6 (six) hours as needed for mild pain or headache.    . carvedilol (COREG) 12.5 MG tablet Take 1 tablet (12.5 mg total) by mouth 2 (two) times daily with a meal. 180 tablet 3  . cetirizine (ZYRTEC) 10 MG tablet Take 1 tablet (10 mg total) by mouth daily as needed for allergies. 30 tablet 1  . clobetasol cream (TEMOVATE) AB-123456789 % Apply 1 application topically 2 (two) times daily as needed (anal itch). Reported on 11/07/2015 30 g 0  . fluticasone (FLONASE) 50 MCG/ACT nasal spray Place 2 sprays into both nostrils daily. 16 g 3  . loratadine (CLARITIN) 10 MG tablet Take 1 tablet (10 mg total) by mouth daily. 30 tablet 0  . losartan (COZAAR) 25 MG tablet Take 1 tablet (25 mg total) by mouth daily. 90 tablet 1  . Multiple Vitamins-Minerals  (CENTRUM SILVER PO) Take 1 tablet by mouth daily.    Alveda Reasons 20 MG TABS tablet TAKE 1 TABLET BY MOUTH EVERY DAY WITH DINNER 30 tablet 6   Current Facility-Administered Medications  Medication Dose Route Frequency Provider Last Rate Last Dose  . 0.9 %  sodium chloride infusion  500 mL Intravenous Continuous Ladene Artist, MD         Past Medical History:  Diagnosis Date  . Allergy   . Atrial fibrillation ([place])    a. Eliquis initiated 04/2013.  Marland Kitchen Atrial flutter ([place])   . Dermatitis 11/22/2012  . Esophageal reflux 04/12/2013  . Hypertension   . Nonischemic cardiomyopathy ([place])    a. presumed to be tachy mediated;  b. 03/2013 Echo: EF 30-35%, diff HK, mild LVH, mildly dil LA.  Marland Kitchen Pericarditis   . Skin lesion 11/26/2011    Past Surgical History:  Procedure Laterality Date  . CALCANEAL OSTEOTOMY Right 1993   "stapled it; screwed it" (04/28/2013)  . CALCANEUS HARDWARE REMOVAL Right ~ 1994  . COLONOSCOPY     10-12 years ago; unable to find records  . 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 LEFT HEART CATHETERIZATION WITH CORONARY ANGIOGRAM N/A 09/30/2013   Procedure: LEFT HEART CATHETERIZATION WITH CORONARY ANGIOGRAM;  Surgeon: Burnell Blanks, MD;  Location: [facility] C[facility] LAB;  Service: Cardiovascular;  Laterality: N/A;  . TONSILLECTOMY  1940's    Social History   Social History  . Marital status: Married  Spouse name: N/A  . Number of children: 2  . Years of education: N/A   Occupational History  . self emp    Social History Main Topics  . Smoking status: Former Smoker    Packs/day: 1.00    Years: 18.00    Types: Cigarettes    Quit date: 09/24/1979  . Smokeless tobacco: Never Used  . Alcohol use 0.0 oz/week     Comment: occasionally   . Drug use: No  . Sexual activity: Yes    Partners: Female   Other Topics Concern  . Not on file   Social History Narrative  . No narrative on file    Family History  Problem Relation Age of Onset  . Stroke Father     . Asthma Mother     low back pain  . Colon cancer Neg Hx   . Esophageal cancer Neg Hx   . Rectal cancer Neg Hx   . Stomach cancer Neg Hx     ROS: no fevers or chills, productive cough, hemoptysis, dysphasia, odynophagia, melena, hematochezia, dysuria, hematuria, rash, seizure activity, orthopnea, PND, pedal edema, claudication. Remaining systems are negative.  Physical Exam: Well-developed well-nourished in no acute distress.  Skin is warm and dry.  HEENT is normal.  Neck is supple.  Chest is clear to auscultation with normal expansion.  Cardiovascular exam is regular rate and rhythm.  Abdominal exam nontender or distended. No masses palpated. Extremities show no edema. neuro grossly intact  ECG- Sinus rhythm at a rate of 76. Left bundle branch block.  A/P  1 Paroxysmal atrial fibrillation-patient remains in sinus rhythm. Continue beta blocker and xarelto. Check hemoglobin and renal function.  2 Atrial flutter-patient did not have ablation previously given the atrial fibrillation was also noted on telemetry.  3 history of nonischemic cardiomyopathy-continue beta blocker and ARB. Will plan follow-up echocardiogram when he returns in 1 year.Kirk Ruths, MD

## 2016-05-28 ENCOUNTER — Encounter: Payer: Self-pay | Admitting: Gastroenterology

## 2016-05-29 ENCOUNTER — Other Ambulatory Visit (INDEPENDENT_AMBULATORY_CARE_PROVIDER_SITE_OTHER): Payer: Medicare HMO

## 2016-05-29 ENCOUNTER — Encounter: Payer: Self-pay | Admitting: Cardiology

## 2016-05-29 ENCOUNTER — Ambulatory Visit (INDEPENDENT_AMBULATORY_CARE_PROVIDER_SITE_OTHER): Payer: Medicare HMO | Admitting: Cardiology

## 2016-05-29 VITALS — BP 102/69 | HR 76 | Ht 69.0 in | Wt 176.0 lb

## 2016-05-29 DIAGNOSIS — I481 Persistent atrial fibrillation: Secondary | ICD-10-CM | POA: Diagnosis not present

## 2016-05-29 DIAGNOSIS — I4819 Other persistent atrial fibrillation: Secondary | ICD-10-CM

## 2016-05-29 DIAGNOSIS — I420 Dilated cardiomyopathy: Secondary | ICD-10-CM

## 2016-05-29 DIAGNOSIS — I4891 Unspecified atrial fibrillation: Secondary | ICD-10-CM | POA: Diagnosis not present

## 2016-05-29 DIAGNOSIS — Z79899 Other long term (current) drug therapy: Secondary | ICD-10-CM

## 2016-05-29 LAB — CBC WITH DIFFERENTIAL/PLATELET
BASOS PCT: 0 %
Basophils Absolute: 0 cells/uL (ref 0–200)
EOS ABS: 170 {cells}/uL (ref 15–500)
Eosinophils Relative: 2 %
HCT: 44.1 % (ref 38.5–50.0)
Hemoglobin: 15.5 g/dL (ref 13.2–17.1)
LYMPHS ABS: 1020 {cells}/uL (ref 850–3900)
Lymphocytes Relative: 12 %
MCH: 30.6 pg (ref 27.0–33.0)
MCHC: 35.1 g/dL (ref 32.0–36.0)
MCV: 87.2 fL (ref 80.0–100.0)
MONO ABS: 1020 {cells}/uL — AB (ref 200–950)
MONOS PCT: 12 %
MPV: 10.3 fL (ref 7.5–12.5)
NEUTROS ABS: 6290 {cells}/uL (ref 1500–7800)
Neutrophils Relative %: 74 %
PLATELETS: 231 10*3/uL (ref 140–400)
RBC: 5.06 MIL/uL (ref 4.20–5.80)
RDW: 12.7 % (ref 11.0–15.0)
WBC: 8.5 10*3/uL (ref 3.8–10.8)

## 2016-05-29 LAB — BASIC METABOLIC PANEL
BUN: 15 mg/dL (ref 7–25)
CHLORIDE: 106 mmol/L (ref 98–110)
CO2: 30 mmol/L (ref 20–31)
CREATININE: 0.93 mg/dL (ref 0.70–1.18)
Calcium: 9.5 mg/dL (ref 8.6–10.3)
Glucose, Bld: 98 mg/dL (ref 65–99)
Potassium: 4.7 mmol/L (ref 3.5–5.3)
Sodium: 142 mmol/L (ref 135–146)

## 2016-05-29 NOTE — Patient Instructions (Addendum)
Your physician recommends that you continue on your current medications as directed. Please refer to the Current Medication list given to you today.  Your physician wants you to follow-up in: [place] will receive a reminder letter in the mail two months in advance. If you don't receive a letter, please call our office to schedule the follow-up appointment.  Your physician recommends that you return for lab work in: [place]

## 2016-07-09 NOTE — Progress Notes (Deleted)
Subjective:   Francis Shaw is a 75 y.o. male who presents for an Initial Medicare Annual Wellness Visit.  Review of Systems  ***      Objective:    There were no vitals filed for this visit. There is no height or weight on file to calculate BMI.  Current Medications (verified) Outpatient Encounter Prescriptions as of 07/11/2016  Medication Sig  . acetaminophen (TYLENOL) 500 MG tablet Take 1,000 mg by mouth every 6 (six) hours as needed for mild pain or headache.  . carvedilol (COREG) 12.5 MG tablet Take 1 tablet (12.5 mg total) by mouth 2 (two) times daily with a meal.  . cetirizine (ZYRTEC) 10 MG tablet Take 1 tablet (10 mg total) by mouth daily as needed for allergies.  . clobetasol cream (TEMOVATE) AB-123456789 % Apply 1 application topically 2 (two) times daily as needed (anal itch). Reported on 11/07/2015  . fluticasone (FLONASE) 50 MCG/ACT nasal spray Place 2 sprays into both nostrils daily.  Marland Kitchen loratadine (CLARITIN) 10 MG tablet Take 1 tablet (10 mg total) by mouth daily.  Marland Kitchen losartan (COZAAR) 25 MG tablet Take 1 tablet (25 mg total) by mouth daily.  . Multiple Vitamins-Minerals (CENTRUM SILVER PO) Take 1 tablet by mouth daily.  Alveda Reasons 20 MG TABS tablet TAKE 1 TABLET BY MOUTH EVERY DAY WITH DINNER   Facility-Administered Encounter Medications as of 07/11/2016  Medication  . 0.9 %  sodium chloride infusion    Allergies (verified) Review of patient's allergies indicates no known allergies.   History: Past Medical History:  Diagnosis Date  . Allergy   . Atrial fibrillation ([place])    a. Eliquis initiated 04/2013.  Marland Kitchen Atrial flutter ([place])   . Dermatitis 11/22/2012  . Esophageal reflux 04/12/2013  . Hypertension   . Nonischemic cardiomyopathy ([place])    a. presumed to be tachy mediated;  b. 03/2013 Echo: EF 30-35%, diff HK, mild LVH, mildly dil LA.  Marland Kitchen Pericarditis   . Skin lesion 11/26/2011   Past Surgical History:  Procedure Laterality Date  . CALCANEAL OSTEOTOMY Right 1993   "stapled it; screwed it" (04/28/2013)  . CALCANEUS HARDWARE REMOVAL Right ~ 1994  . COLONOSCOPY     10-12 years ago; unable to find records  . 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 LEFT HEART CATHETERIZATION WITH CORONARY ANGIOGRAM N/A 09/30/2013   Procedure: LEFT HEART CATHETERIZATION WITH CORONARY ANGIOGRAM;  Surgeon: Burnell Blanks, MD;  Location: [facility] CATH LAB;  Service: Cardiovascular;  Laterality: N/A;  . TONSILLECTOMY  1940's   Family History  Problem Relation Age of Onset  . Stroke Father   . Asthma Mother     low back pain  . Colon cancer Neg Hx   . Esophageal cancer Neg Hx   . Rectal cancer Neg Hx   . Stomach cancer Neg Hx    Social History   Occupational History  . self emp    Social History Main Topics  . Smoking status: Former Smoker    Packs/day: 1.00    Years: 18.00    Types: Cigarettes    Quit date: 09/24/1979  . Smokeless tobacco: Never Used  . Alcohol use 0.0 oz/week     Comment: occasionally   . Drug use: No  . Sexual activity: Yes    Partners: Female   Tobacco Counseling Counseling given: Not Answered   Activities of Daily Living No flowsheet data found.  Immunizations and Health Maintenance Immunization History  Administered Date(s) Administered  .  Pneumococcal Conjugate-13 05/01/2015  . Pneumococcal Polysaccharide-23 03/05/2012  . Td 09/23/2006  . Tdap 03/05/2012  . Zoster 10/24/2013   There are no preventive care reminders to display for this patient.  Patient Care Team: Mosie Lukes, MD as PCP - General (Family Medicine) Lelon Perla, MD as Consulting Physician (Cardiology)  Indicate any recent Medical Services you may have received from other than [facility] providers in the past year (date may be approximate).    Assessment:   This is a routine wellness examination for Francis Shaw. ***  Hearing/Vision screen No exam data present  Dietary issues and exercise activities discussed:    in general, a "healthy" diet  ,  well balanced, on average, 3 meals per day, low fat/ cholesterol, low salt        Goals    . Maintain Health  (pt-stated)          Visit your primary care provider regularly.  Make time for family and friends.  Healthy relationships are important.  Take medications as directed by your provider.  Maintain a healthy weight and a trim waistline.  Eat healthy meals and snacks, rich in fruits, vegetables, whole grains and lean proteins.  Aim for 150 minutes of moderate physical activity each week.  Don't smoke.  Avoid alcohol or drink in moderation.  Manage stress through prayer, meditation or mindful relaxation.  Get seven to nine hours of quality sleep each night.         Depression Screen PHQ 2/9 Scores 05/01/2015  PHQ - 2 Score 0    Fall Risk Fall Risk  05/01/2015  Falls in the past year? No    Cognitive Function: MMSE - Mini Mental State Exam 05/01/2015  Orientation to time 5  Orientation to Place 5  Registration 3  Attention/ Calculation 5  Recall 2  Language- name 2 objects 2  Language- repeat 1  Language- follow 3 step command 3  Language- read & follow direction 1  Write a sentence 1  Copy design 1  Total score 29    Screening Tests Health Maintenance  Topic Date Due  . INFLUENZA VACCINE  09/16/2016 (Originally 04/23/2016)  . COLONOSCOPY  05/22/2019  . TETANUS/TDAP  03/05/2022  . ZOSTAVAX  Completed  . PNA vac Low Risk Adult  Completed        Plan:   ***  During the course of the visit Foy was educated and counseled about the following appropriate screening and preventive services:   Vaccines to include Pneumoccal, Influenza, Hepatitis B, Td, Zostavax, HCV  Electrocardiogram  Colorectal cancer screening  Cardiovascular disease screening  Diabetes screening  Glaucoma screening  Nutrition counseling  Prostate cancer screening  Smoking cessation counseling  Patient Instructions (the written plan) were given to the patient.    Dorrene German, RN   07/09/2016

## 2016-07-10 NOTE — Progress Notes (Deleted)
Subjective:   Francis Shaw is a 75 y.o. male who presents for Medicare Annual/Subsequent preventive examination.  Review of Systems:  No ROS.  Medicare Wellness Visit.     Sleep patterns:    Home Safety/Smoke Alarms:   Living environment; residence and Firearm Safety:  Seat Belt Safety/Bike Helmet:    Counseling:   Eye Exam-  Dental-  Male:   CCS- 05/21/16 w/ Dr. Lucio Edward, 12 mm polyp the sigmoid colon, diverticulosis in sigmoid and transverse colon, internal hemorrhoids. 3 year recall.   PSA-  Lab Results  Component Value Date   PSA 2.28 11/26/2011       Objective:    Vitals: There were no vitals taken for this visit.  There is no height or weight on file to calculate BMI.  Tobacco History  Smoking Status  . Former Smoker  . Packs/day: 1.00  . Years: 18.00  . Types: Cigarettes  . Quit date: 09/24/1979  Smokeless Tobacco  . Never Used     Counseling given: Not Answered   Past Medical History:  Diagnosis Date  . Allergy   . Atrial fibrillation ([place])    a. Eliquis initiated 04/2013.  Marland Kitchen Atrial flutter ([place])   . Dermatitis 11/22/2012  . Esophageal reflux 04/12/2013  . Hypertension   . Nonischemic cardiomyopathy ([place])    a. presumed to be tachy mediated;  b. 03/2013 Echo: EF 30-35%, diff HK, mild LVH, mildly dil LA.  Marland Kitchen Pericarditis   . Skin lesion 11/26/2011   Past Surgical History:  Procedure Laterality Date  . CALCANEAL OSTEOTOMY Right 1993   "stapled it; screwed it" (04/28/2013)  . CALCANEUS HARDWARE REMOVAL Right ~ 1994  . COLONOSCOPY     10-12 years ago; unable to find records  . 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 LEFT HEART CATHETERIZATION WITH CORONARY ANGIOGRAM N/A 09/30/2013   Procedure: LEFT HEART CATHETERIZATION WITH CORONARY ANGIOGRAM;  Surgeon: Burnell Blanks, MD;  Location: [facility] CATH LAB;  Service: Cardiovascular;  Laterality: N/A;  . TONSILLECTOMY  1940's   Family History  Problem Relation Age of Onset  . Stroke  Father   . Asthma Mother     low back pain  . Colon cancer Neg Hx   . Esophageal cancer Neg Hx   . Rectal cancer Neg Hx   . Stomach cancer Neg Hx    History  Sexual Activity  . Sexual activity: Yes  . Partners: Female    Outpatient Encounter Prescriptions as of 07/11/2016  Medication Sig  . acetaminophen (TYLENOL) 500 MG tablet Take 1,000 mg by mouth every 6 (six) hours as needed for mild pain or headache.  . carvedilol (COREG) 12.5 MG tablet Take 1 tablet (12.5 mg total) by mouth 2 (two) times daily with a meal.  . cetirizine (ZYRTEC) 10 MG tablet Take 1 tablet (10 mg total) by mouth daily as needed for allergies.  . clobetasol cream (TEMOVATE) AB-123456789 % Apply 1 application topically 2 (two) times daily as needed (anal itch). Reported on 11/07/2015  . fluticasone (FLONASE) 50 MCG/ACT nasal spray Place 2 sprays into both nostrils daily.  Marland Kitchen loratadine (CLARITIN) 10 MG tablet Take 1 tablet (10 mg total) by mouth daily.  Marland Kitchen losartan (COZAAR) 25 MG tablet Take 1 tablet (25 mg total) by mouth daily.  . Multiple Vitamins-Minerals (CENTRUM SILVER PO) Take 1 tablet by mouth daily.  Alveda Reasons 20 MG TABS tablet TAKE 1 TABLET BY MOUTH EVERY DAY WITH DINNER  Facility-Administered Encounter Medications as of 07/11/2016  Medication  . 0.9 %  sodium chloride infusion    Activities of Daily Living No flowsheet data found.  Patient Care Team: Mosie Lukes, MD as PCP - General (Family Medicine) Lelon Perla, MD as Consulting Physician (Cardiology)   Assessment:    Physical assessment deferred to PCP.  Exercise Activities and Dietary recommendations    {Desc; diets:16563}  Diet (meal preparation, eat out, water intake, caffeinated beverages, dairy products, fruits and vegetables): Breakfast: Lunch:  Dinner:      Goals    . Maintain Health  (pt-stated)          Visit your primary care provider regularly.  Make time for family and friends.  Healthy relationships are  important.  Take medications as directed by your provider.  Maintain a healthy weight and a trim waistline.  Eat healthy meals and snacks, rich in fruits, vegetables, whole grains and lean proteins.  Aim for 150 minutes of moderate physical activity each week.  Don't smoke.  Avoid alcohol or drink in moderation.  Manage stress through prayer, meditation or mindful relaxation.  Get seven to nine hours of quality sleep each night.         Fall Risk Fall Risk  05/01/2015  Falls in the past year? No   Depression Screen PHQ 2/9 Scores 05/01/2015  PHQ - 2 Score 0    Cognitive Testing MMSE - Mini Mental State Exam 05/01/2015  Orientation to time 5  Orientation to Place 5  Registration 3  Attention/ Calculation 5  Recall 2  Language- name 2 objects 2  Language- repeat 1  Language- follow 3 step command 3  Language- read & follow direction 1  Write a sentence 1  Copy design 1  Total score 29    Immunization History  Administered Date(s) Administered  . Pneumococcal Conjugate-13 05/01/2015  . Pneumococcal Polysaccharide-23 03/05/2012  . Td 09/23/2006  . Tdap 03/05/2012  . Zoster 10/24/2013   Screening Tests Health Maintenance  Topic Date Due  . INFLUENZA VACCINE  09/16/2016 (Originally 04/23/2016)  . COLONOSCOPY  05/22/2019  . TETANUS/TDAP  03/05/2022  . ZOSTAVAX  Completed  . PNA vac Low Risk Adult  Completed      Plan:   Follow up w/ Dr. Charlett Blake as scheduled.  During the course of the visit the patient was educated and counseled about the following appropriate screening and preventive services:   Vaccines to include Pneumoccal, Influenza, Hepatitis B, Td, Zostavax, HCV  Electrocardiogram  Cardiovascular Disease  Colorectal cancer screening  Diabetes screening  Prostate Cancer Screening  Glaucoma screening  Nutrition counseling   Smoking cessation counseling  Patient Instructions (the written plan) was given to the patient.    Dorrene German,  RN  07/10/2016

## 2016-07-11 ENCOUNTER — Encounter: Payer: Medicare HMO | Admitting: *Deleted

## 2016-07-11 ENCOUNTER — Encounter: Payer: Self-pay | Admitting: Family Medicine

## 2016-07-11 ENCOUNTER — Ambulatory Visit (INDEPENDENT_AMBULATORY_CARE_PROVIDER_SITE_OTHER): Payer: Medicare HMO | Admitting: Family Medicine

## 2016-07-11 VITALS — BP 114/62 | HR 70 | Resp 14 | Ht 69.5 in | Wt 179.8 lb

## 2016-07-11 DIAGNOSIS — E785 Hyperlipidemia, unspecified: Secondary | ICD-10-CM | POA: Diagnosis not present

## 2016-07-11 DIAGNOSIS — Z0000 Encounter for general adult medical examination without abnormal findings: Secondary | ICD-10-CM

## 2016-07-11 NOTE — Progress Notes (Signed)
Subjective:   Francis Shaw is a 75 y.o. male who presents for Medicare Annual/Subsequent preventive examination.  Review of Systems:  No ROS.  Medicare Wellness Visit. Cardiac Risk Factors include: advanced age (>36men, >13 women);dyslipidemia;male gender;hypertension;sedentary lifestyle  Sleep patterns: No sleep issues. Sleeps 8 hrs nightly. Gets up once nightly or less to void.   Home Safety/Smoke Alarms: Lives in home w/ wife and dog. Feels very safe in home. Smoke detectors present in home.    Living environment; residence and Firearm Safety: Stored in a locked box. Seat Belt Safety/Bike Helmet: Always wears seat belt.   Counseling:   Eye Exam- [facility] yearly or as needed. Dental- sees Dr. Trenton Gammon every 6 months or as needed  Male:   CCS- last 05/21/16 w/ Dr. Lucio Edward. 12 mm polyp in the sigmoid colon. Diverticulosis in the sigmoid, descending, and transverse colon; internal hemorrhoids. 3 year recall.    PSA-  Lab Results  Component Value Date   PSA 2.28 11/26/2011      Objective:    Vitals: BP 114/62 (BP Location: Right Arm, Patient Position: Sitting, Cuff Size: Normal)   Pulse 70   Resp 14   Ht 5' 9.5" (1.765 m)   Wt 179 lb 12.8 oz (81.6 kg)   SpO2 97%   BMI 26.17 kg/m   Body mass index is 26.17 kg/m.  Tobacco History  Smoking Status  . Former Smoker  . Packs/day: 1.00  . Years: 18.00  . Types: Cigarettes  . Quit date: 09/24/1979  Smokeless Tobacco  . Never Used     Counseling given: Not Answered   Past Medical History:  Diagnosis Date  . Allergy   . Atrial fibrillation ([place])    a. Eliquis initiated 04/2013.  Marland Kitchen Atrial flutter ([place])   . Dermatitis 11/22/2012  . Esophageal reflux 04/12/2013  . Hypertension   . Nonischemic cardiomyopathy ([place])    a. presumed to be tachy mediated;  b. 03/2013 Echo: EF 30-35%, diff HK, mild LVH, mildly dil LA.  Marland Kitchen Pericarditis   . Skin lesion 11/26/2011   Past Surgical History:  Procedure Laterality Date   . CALCANEAL OSTEOTOMY Right 1993   "stapled it; screwed it" (04/28/2013)  . CALCANEUS HARDWARE REMOVAL Right ~ 1994  . COLONOSCOPY     10-12 years ago; unable to find records  . 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 LEFT HEART CATHETERIZATION WITH CORONARY ANGIOGRAM N/A 09/30/2013   Procedure: LEFT HEART CATHETERIZATION WITH CORONARY ANGIOGRAM;  Surgeon: Burnell Blanks, MD;  Location: [facility] CATH LAB;  Service: Cardiovascular;  Laterality: N/A;  . TONSILLECTOMY  1940's   Family History  Problem Relation Age of Onset  . Stroke Father   . Asthma Mother     low back pain  . Colon cancer Neg Hx   . Esophageal cancer Neg Hx   . Rectal cancer Neg Hx   . Stomach cancer Neg Hx    History  Sexual Activity  . Sexual activity: Yes  . Partners: Female    Outpatient Encounter Prescriptions as of 07/11/2016  Medication Sig  . acetaminophen (TYLENOL) 500 MG tablet Take 1,000 mg by mouth every 6 (six) hours as needed for mild pain or headache.  . carvedilol (COREG) 12.5 MG tablet Take 1 tablet (12.5 mg total) by mouth 2 (two) times daily with a meal.  . cetirizine (ZYRTEC) 10 MG tablet Take 1 tablet (10 mg total) by mouth daily as needed for allergies.  Marland Kitchen  clobetasol cream (TEMOVATE) AB-123456789 % Apply 1 application topically 2 (two) times daily as needed (anal itch). Reported on 11/07/2015  . fluticasone (FLONASE) 50 MCG/ACT nasal spray Place 2 sprays into both nostrils daily. (Patient taking differently: Place 2 sprays into both nostrils daily as needed. )  . losartan (COZAAR) 25 MG tablet Take 1 tablet (25 mg total) by mouth daily.  . Multiple Vitamins-Minerals (CENTRUM SILVER PO) Take 1 tablet by mouth daily.  Alveda Reasons 20 MG TABS tablet TAKE 1 TABLET BY MOUTH EVERY DAY WITH DINNER  . [DISCONTINUED] loratadine (CLARITIN) 10 MG tablet Take 1 tablet (10 mg total) by mouth daily. (Patient not taking: Reported on 07/11/2016)  . [DISCONTINUED] 0.9 %  sodium chloride infusion    No  facility-administered encounter medications on file as of 07/11/2016.     Activities of Daily Living In your present state of health, do you have any difficulty performing the following activities: 07/11/2016  Hearing? N  Vision? N  Difficulty concentrating or making decisions? N  Walking or climbing stairs? N  Dressing or bathing? N  Doing errands, shopping? N  Preparing Food and eating ? N  Using the Toilet? N  In the past six months, have you accidently leaked urine? N  Do you have problems with loss of bowel control? N  Managing your Medications? N  Managing your Finances? N  Housekeeping or managing your Housekeeping? N  Some recent data might be hidden    Patient Care Team: Mosie Lukes, MD as PCP - General (Family Medicine) Lelon Perla, MD as Consulting Physician (Cardiology)   Assessment:    Physical assessment deferred to PCP.  Exercise Activities and Dietary recommendations Current Exercise Habits: Home exercise routine, Type of exercise: Other - see comments;walking (plays golf 1-2x weekly), Time (Minutes): 30, Frequency (Times/Week): 5, Weekly Exercise (Minutes/Week): 150, Intensity: Mild  Diet (meal preparation, eat out, water intake, caffeinated beverages, dairy products, fruits and vegetables): Able to prepare meals. In general, a "healthy" diet  , on average, 3 meals per day, on average, 5 fast food meals per week.  Breakfast: Toaster strudel + juice on weekdays. Larger breakfast on weekends. Lunch: Usually fast food.  Dinner: Varies, often makes crock pot meals.     Goals    . Healthy Lifestyle (pt-stated)          Visit your primary care provider regularly.  Make time for family and friends.  Healthy relationships are important.  Take medications as directed by your provider.  Maintain a healthy weight and a trim waistline.  Eat healthy meals and snacks, rich in fruits, vegetables, whole grains and lean proteins.  Aim for 150 minutes of  moderate physical activity each week.  Avoid alcohol or drink in moderation.  Manage stress through prayer, meditation or mindful relaxation.  Get seven to nine hours of quality sleep each night.         Fall Risk Fall Risk  07/11/2016 05/01/2015  Falls in the past year? No No   Depression Screen PHQ 2/9 Scores 07/11/2016 05/01/2015  PHQ - 2 Score 0 0    Cognitive Function MMSE - Mini Mental State Exam 07/11/2016 05/01/2015  Orientation to time 5 5  Orientation to Place 5 5  Registration 3 3  Attention/ Calculation 5 5  Recall 2 2  Language- name 2 objects 2 2  Language- repeat 1 1  Language- follow 3 step command 3 3  Language- read & follow direction 1 1  Write a sentence 1 1  Copy design 1 1  Total score 29 29   Hearing Screening Comments: Able to hear conversational tones w/o difficulty. No issues reported. Vision Screening Comments: Follows w/ [facility] yearly and PRN. Wears reading glasses, and another pair of glasses for night driving. No vision issues reported.       Immunization History  Administered Date(s) Administered  . Pneumococcal Conjugate-13 05/01/2015  . Pneumococcal Polysaccharide-23 03/05/2012  . Td 09/23/2006  . Tdap 03/05/2012  . Zoster 10/24/2013   Screening Tests Health Maintenance  Topic Date Due  . INFLUENZA VACCINE  12/21/2016 (Originally 04/23/2016)  . COLONOSCOPY  05/22/2019  . TETANUS/TDAP  03/05/2022  . ZOSTAVAX  Completed  . PNA vac Low Risk Adult  Completed      Plan:   Follow-up with Dr. Charlett Blake as directed.   Bring a copy of your advanced directives to your next office visit.  Continue to eat heart healthy diet (full of fruits, vegetables, whole grains, lean protein, water--limit salt, fat, and sugar intake) and increase physical activity as tolerated.  Continue doing brain stimulating activities (puzzles, reading, adult coloring books, staying active) to keep memory sharp.   During the course of the visit the  patient was educated and counseled about the following appropriate screening and preventive services:   Vaccines to include Pneumoccal, Influenza, Hepatitis B, Td, Zostavax, HCV  Cardiovascular Disease  Colorectal cancer screening  Diabetes screening  Prostate Cancer Screening  Glaucoma screening  Nutrition counseling   Patient Instructions (the written plan) was given to the patient.    Dorrene German, RN  07/11/2016  RN AWV note reviewed. Agree with documention and plan.  Penni Homans, MD

## 2016-07-11 NOTE — Progress Notes (Signed)
Pre visit review using our clinic review tool, if applicable. No additional management support is needed unless otherwise documented below in the visit note. 

## 2016-07-11 NOTE — Patient Instructions (Addendum)
Bring a copy of your advanced directives to your next office visit.  Continue to eat heart healthy diet (full of fruits, vegetables, whole grains, lean protein, water--limit salt, fat, and sugar intake) and increase physical activity as tolerated.  Continue doing brain stimulating activities (puzzles, reading, adult coloring books, staying active) to keep memory sharp. Preventive Care for Adults, Male A healthy lifestyle and preventive care can promote health and wellness. Preventive health guidelines for men include the following key practices:  A routine yearly physical is a good way to  check with your health care provider about your health and preventative screening. It is a chance to share any concerns and updates on your health and to receive a thorough exam.  Visit your dentist for a routine exam and preventative care every 6 months. Brush your teeth twice a day and floss once a day. Good oral hygiene prevents tooth decay and gum disease.  The frequency of eye exams is based on your age, health, family medical history, use of contact lenses, and other factors. Follow your health care provider's recommendations for frequency of eye exams.  Eat a healthy diet. Foods such as vegetables, fruits, whole grains, low-fat dairy products, and lean protein foods contain the nutrients you need without too many calories. Decrease your intake of foods high in solid fats, added sugars, and salt. Eat the right amount of calories for you.Get information about a proper diet from your health care provider, if necessary.  Regular physical exercise is one of the most important things you can do for your health. Most adults should get at least 150 minutes of moderate-intensity exercise (any activity that increases your heart rate and causes you to sweat) each week. In addition, most adults need muscle-strengthening exercises on 2 or more days a week.  Maintain a healthy weight. The body mass index (BMI) is a  screening tool to identify possible weight problems. It provides an estimate of body fat based on height and weight. Your health care provider can find your BMI and can help you achieve or maintain a healthy weight.For adults 20 years and older:  A BMI below 18.5 is considered underweight.  A BMI of 18.5 to 24.9 is normal.  A BMI of 25 to 29.9 is considered overweight.  A BMI of 30 and above is considered obese.  Maintain normal blood lipids and cholesterol levels by exercising and minimizing your intake of saturated fat. Eat a balanced diet with plenty of fruit and vegetables. Blood tests for lipids and cholesterol should begin at age 7 and be repeated every 5 years. If your lipid or cholesterol levels are high, you are over 50, or you are at high risk for heart disease, you may need your cholesterol levels checked more frequently.Ongoing high lipid and cholesterol levels should be treated with medicines if diet and exercise are not working.  If you smoke, find out from your health care provider how to quit. If you do not use tobacco, do not start.  Lung cancer screening is recommended for adults aged 87-80 years who are at high risk for developing lung cancer because of a history of smoking. A yearly low-dose CT scan of the lungs is recommended for people who have at least a 30-pack-year history of smoking and are a current smoker or have quit within the past 15 years. A pack year of smoking is smoking an average of 1 pack of cigarettes a day for 1 year (for example: 1 pack a day for  30 years or 2 packs a day for 15 years). Yearly screening should continue until the smoker has stopped smoking for at least 15 years. Yearly screening should be stopped for people who develop a health problem that would prevent them from having lung cancer treatment.  If you choose to drink alcohol, do not have more than 2 drinks per day. One drink is considered to be 12 ounces (355 mL) of beer, 5 ounces (148 mL) of  wine, or 1.5 ounces (44 mL) of liquor.  Avoid use of street drugs. Do not share needles with anyone. Ask for help if you need support or instructions about stopping the use of drugs.  High blood pressure causes heart disease and increases the risk of stroke. Your blood pressure should be checked at least every 1-2 years. Ongoing high blood pressure should be treated with medicines, if weight loss and exercise are not effective.  If you are 79-38 years old, ask your health care provider if you should take aspirin to prevent heart disease.  Diabetes screening is done by taking a blood sample to check your blood glucose level after you have not eaten for a certain period of time (fasting). If you are not overweight and you do not have risk factors for diabetes, you should be screened once every 3 years starting at age 27. If you are overweight or obese and you are 74-80 years of age, you should be screened for diabetes every year as part of your cardiovascular risk assessment.  Colorectal cancer can be detected and often prevented. Most routine colorectal cancer screening begins at the age of 2 and continues through age 2. However, your health care provider may recommend screening at an earlier age if you have risk factors for colon cancer. On a yearly basis, your health care provider may provide home test kits to check for hidden blood in the stool. Use of a small camera at the end of a tube to directly examine the colon (sigmoidoscopy or colonoscopy) can detect the earliest forms of colorectal cancer. Talk to your health care provider about this at age 75, when routine screening begins. Direct exam of the colon should be repeated every 5-10 years through age 33, unless early forms of precancerous polyps or small growths are found.  People who are at an increased risk for hepatitis B should be screened for this virus. You are considered at high risk for hepatitis B if:  You were born in a country where  hepatitis B occurs often. Talk with your health care provider about which countries are considered high risk.  Your parents were born in a high-risk country and you have not received a shot to protect against hepatitis B (hepatitis B vaccine).  You have HIV or AIDS.  You use needles to inject street drugs.  You live with, or have sex with, someone who has hepatitis B.  You are a man who has sex with other men (MSM).  You get hemodialysis treatment.  You take certain medicines for conditions such as cancer, organ transplantation, and autoimmune conditions.  Hepatitis C blood testing is recommended for all people born from [birth year] through 1965 and any individual with known risks for hepatitis C.  Practice safe sex. Use condoms and avoid high-risk sexual practices to reduce the spread of sexually transmitted infections (STIs). STIs include gonorrhea, chlamydia, syphilis, trichomonas, herpes, HPV, and human immunodeficiency virus (HIV). Herpes, HIV, and HPV are viral illnesses that have no cure. They can result in  disability, cancer, and death.  If you are a man who has sex with other men, you should be screened at least once per year for:  HIV.  Urethral, rectal, and pharyngeal infection of gonorrhea, chlamydia, or both.  If you are at risk of being infected with HIV, it is recommended that you take a prescription medicine daily to prevent HIV infection. This is called preexposure prophylaxis (PrEP). You are considered at risk if:  You are a man who has sex with other men (MSM) and have other risk factors.  You are a heterosexual man, are sexually active, and are at increased risk for HIV infection.  You take drugs by injection.  You are sexually active with a partner who has HIV.  Talk with your health care provider about whether you are at high risk of being infected with HIV. If you choose to begin PrEP, you should first be tested for HIV. You should then be tested every 3 months for  as long as you are taking PrEP.  A one-time screening for abdominal aortic aneurysm (AAA) and surgical repair of large AAAs by ultrasound are recommended for men ages 14 to 14 years who are current or former smokers.  Healthy men should no longer receive prostate-specific antigen (PSA) blood tests as part of routine cancer screening. Talk with your health care provider about prostate cancer screening.  Testicular cancer screening is not recommended for adult males who have no symptoms. Screening includes self-exam, a health care provider exam, and other screening tests. Consult with your health care provider about any symptoms you have or any concerns you have about testicular cancer.  Use sunscreen. Apply sunscreen liberally and repeatedly throughout the day. You should seek shade when your shadow is shorter than you. Protect yourself by wearing long sleeves, pants, a wide-brimmed hat, and sunglasses year round, whenever you are outdoors.  Once a month, do a whole-body skin exam, using a mirror to look at the skin on your back. Tell your health care provider about new moles, moles that have irregular borders, moles that are larger than a pencil eraser, or moles that have changed in shape or color.  Stay current with required vaccines (immunizations).  Influenza vaccine. All adults should be immunized every year.  Tetanus, diphtheria, and acellular pertussis (Td, Tdap) vaccine. An adult who has not previously received Tdap or who does not know his vaccine status should receive 1 dose of Tdap. This initial dose should be followed by tetanus and diphtheria toxoids (Td) booster doses every 10 years. Adults with an unknown or incomplete history of completing a 3-dose immunization series with Td-containing vaccines should begin or complete a primary immunization series including a Tdap dose. Adults should receive a Td booster every 10 years.  Varicella vaccine. An adult without evidence of immunity to  varicella should receive 2 doses or a second dose if he has previously received 1 dose.  Human papillomavirus (HPV) vaccine. Males aged 11-21 years who have not received the vaccine previously should receive the 3-dose series. Males aged 22-26 years may be immunized. Immunization is recommended through the age of 54 years for any male who has sex with males and did not get any or all doses earlier. Immunization is recommended for any person with an immunocompromised condition through the age of 59 years if he did not get any or all doses earlier. During the 3-dose series, the second dose should be obtained 4-8 weeks after the first dose. The third dose should  be obtained 24 weeks after the first dose and 16 weeks after the second dose.  Zoster vaccine. One dose is recommended for adults aged 26 years or older unless certain conditions are present.  Measles, mumps, and rubella (MMR) vaccine. Adults born before [birth year] generally are considered immune to measles and mumps. Adults born in [birth year] or later should have 1 or more doses of MMR vaccine unless there is a contraindication to the vaccine or there is laboratory evidence of immunity to each of the three diseases. A routine second dose of MMR vaccine should be obtained at least 28 days after the first dose for students attending postsecondary schools, health care workers, or international travelers. People who received inactivated measles vaccine or an unknown type of measles vaccine during 1963-1967 should receive 2 doses of MMR vaccine. People who received inactivated mumps vaccine or an unknown type of mumps vaccine before 1979 and are at high risk for mumps infection should consider immunization with 2 doses of MMR vaccine. Unvaccinated health care workers born before [birth year] who lack laboratory evidence of measles, mumps, or rubella immunity or laboratory confirmation of disease should consider measles and mumps immunization with 2 doses of MMR vaccine or  rubella immunization with 1 dose of MMR vaccine.  Pneumococcal 13-valent conjugate (PCV13) vaccine. When indicated, a person who is uncertain of his immunization history and has no record of immunization should receive the PCV13 vaccine. All adults 28 years of age and older should receive this vaccine. An adult aged 60 years or older who has certain medical conditions and has not been previously immunized should receive 1 dose of PCV13 vaccine. This PCV13 should be followed with a dose of pneumococcal polysaccharide (PPSV23) vaccine. Adults who are at high risk for pneumococcal disease should obtain the PPSV23 vaccine at least 8 weeks after the dose of PCV13 vaccine. Adults older than 74 years of age who have normal immune system function should obtain the PPSV23 vaccine dose at least 1 year after the dose of PCV13 vaccine.  Pneumococcal polysaccharide (PPSV23) vaccine. When PCV13 is also indicated, PCV13 should be obtained first. All adults aged 84 years and older should be immunized. An adult younger than age 58 years who has certain medical conditions should be immunized. Any person who resides in a nursing home or long-term care facility should be immunized. An adult smoker should be immunized. People with an immunocompromised condition and certain other conditions should receive both PCV13 and PPSV23 vaccines. People with human immunodeficiency virus (HIV) infection should be immunized as soon as possible after diagnosis. Immunization during chemotherapy or radiation therapy should be avoided. Routine use of PPSV23 vaccine is not recommended for American Indians, [place] Natives, or people younger than 65 years unless there are medical conditions that require PPSV23 vaccine. When indicated, people who have unknown immunization and have no record of immunization should receive PPSV23 vaccine. One-time revaccination 5 years after the first dose of PPSV23 is recommended for people aged 19-64 years who have  chronic kidney failure, nephrotic syndrome, asplenia, or immunocompromised conditions. People who received 1-2 doses of PPSV23 before age 39 years should receive another dose of PPSV23 vaccine at age 75 years or later if at least 5 years have passed since the previous dose. Doses of PPSV23 are not needed for people immunized with PPSV23 at or after age 48 years.  Meningococcal vaccine. Adults with asplenia or persistent complement component deficiencies should receive 2 doses of quadrivalent meningococcal conjugate (MenACWY-D) vaccine. The doses should be  obtained at least 2 months apart. Microbiologists working with certain meningococcal bacteria, [place] recruits, people at risk during an outbreak, and people who travel to or live in countries with a high rate of meningitis should be immunized. A first-year college student up through age 1 years who is living in a residence hall should receive a dose if he did not receive a dose on or after his 16th birthday. Adults who have certain high-risk conditions should receive one or more doses of vaccine.  Hepatitis A vaccine. Adults who wish to be protected from this disease, have chronic liver disease, work with hepatitis A-infected animals, work in hepatitis A research labs, or travel to or work in countries with a high rate of hepatitis A should be immunized. Adults who were previously unvaccinated and who anticipate close contact with an international adoptee during the first 60 days after arrival in the [place] from a country with a high rate of hepatitis A should be immunized.  Hepatitis B vaccine. Adults should be immunized if they wish to be protected from this disease, are under age 35 years and have diabetes, have chronic liver disease, have had more than one sex partner in the past 6 months, may be exposed to blood or other infectious body fluids, are household contacts or sex partners of hepatitis B positive people, are clients or workers in  certain care facilities, or travel to or work in countries with a high rate of hepatitis B.  Haemophilus influenzae type b (Hib) vaccine. A previously unvaccinated person with asplenia or sickle cell disease or having a scheduled splenectomy should receive 1 dose of Hib vaccine. Regardless of previous immunization, a recipient of a hematopoietic stem cell transplant should receive a 3-dose series 6-12 months after his successful transplant. Hib vaccine is not recommended for adults with HIV infection. Preventive Service / Frequency Ages 2 to 45  Blood pressure check.** / Every 3-5 years.  Lipid and cholesterol check.** / Every 5 years beginning at age 71.  Hepatitis C blood test.** / For any individual with known risks for hepatitis C.  Skin self-exam. / Monthly.  Influenza vaccine. / Every year.  Tetanus, diphtheria, and acellular pertussis (Tdap, Td) vaccine.** / Consult your health care provider. 1 dose of Td every 10 years.  Varicella vaccine.** / Consult your health care provider.  HPV vaccine. / 3 doses over 6 months, if 42 or younger.  Measles, mumps, rubella (MMR) vaccine.** / You need at least 1 dose of MMR if you were born in 1957 or later. You may also need a second dose.  Pneumococcal 13-valent conjugate (PCV13) vaccine.** / Consult your health care provider.  Pneumococcal polysaccharide (PPSV23) vaccine.** / 1 to 2 doses if you smoke cigarettes or if you have certain conditions.  Meningococcal vaccine.** / 1 dose if you are age 42 to 28 years and a [employer] researcher living in a residence hall, or have one of several medical conditions. You may also need additional booster doses.  Hepatitis A vaccine.** / Consult your health care provider.  Hepatitis B vaccine.** / Consult your health care provider.  Haemophilus influenzae type b (Hib) vaccine.** / Consult your health care provider. Ages 74 to 24  Blood pressure check.** / Every year.  Lipid and  cholesterol check.** / Every 5 years beginning at age 46.  Lung cancer screening. / Every year if you are aged 72-80 years and have a 30-pack-year history of smoking and currently smoke or have quit within the  past 15 years. Yearly screening is stopped once you have quit smoking for at least 15 years or develop a health problem that would prevent you from having lung cancer treatment.  Fecal occult blood test (FOBT) of stool. / Every year beginning at age 36 and continuing until age 10. You may not have to do this test if you get a colonoscopy every 10 years.  Flexible sigmoidoscopy** or colonoscopy.** / Every 5 years for a flexible sigmoidoscopy or every 10 years for a colonoscopy beginning at age 51 and continuing until age 37.  Hepatitis C blood test.** / For all people born from [birth year] through 1965 and any individual with known risks for hepatitis C.  Skin self-exam. / Monthly.  Influenza vaccine. / Every year.  Tetanus, diphtheria, and acellular pertussis (Tdap/Td) vaccine.** / Consult your health care provider. 1 dose of Td every 10 years.  Varicella vaccine.** / Consult your health care provider.  Zoster vaccine.** / 1 dose for adults aged 93 years or older.  Measles, mumps, rubella (MMR) vaccine.** / You need at least 1 dose of MMR if you were born in 1956 or later. You may also need a second dose.  Pneumococcal 13-valent conjugate (PCV13) vaccine.** / Consult your health care provider.  Pneumococcal polysaccharide (PPSV23) vaccine.** / 1 to 2 doses if you smoke cigarettes or if you have certain conditions.  Meningococcal vaccine.** / Consult your health care provider.  Hepatitis A vaccine.** / Consult your health care provider.  Hepatitis B vaccine.** / Consult your health care provider.  Haemophilus influenzae type b (Hib) vaccine.** / Consult your health care provider. Ages 21 and over  Blood pressure check.** / Every year.  Lipid and cholesterol check.**/ Every 5 years  beginning at age 26.  Lung cancer screening. / Every year if you are aged 34-80 years and have a 30-pack-year history of smoking and currently smoke or have quit within the past 15 years. Yearly screening is stopped once you have quit smoking for at least 15 years or develop a health problem that would prevent you from having lung cancer treatment.  Fecal occult blood test (FOBT) of stool. / Every year beginning at age 27 and continuing until age 30. You may not have to do this test if you get a colonoscopy every 10 years.  Flexible sigmoidoscopy** or colonoscopy.** / Every 5 years for a flexible sigmoidoscopy or every 10 years for a colonoscopy beginning at age 80 and continuing until age 50.  Hepatitis C blood test.** / For all people born from [birth year] through 1964 and any individual with known risks for hepatitis C.  Abdominal aortic aneurysm (AAA) screening.** / A one-time screening for ages 58 to 35 years who are current or former smokers.  Skin self-exam. / Monthly.  Influenza vaccine. / Every year.  Tetanus, diphtheria, and acellular pertussis (Tdap/Td) vaccine.** / 1 dose of Td every 10 years.  Varicella vaccine.** / Consult your health care provider.  Zoster vaccine.** / 1 dose for adults aged 37 years or older.  Pneumococcal 13-valent conjugate (PCV13) vaccine.** / 1 dose for all adults aged 51 years and older.  Pneumococcal polysaccharide (PPSV23) vaccine.** / 1 dose for all adults aged 41 years and older.  Meningococcal vaccine.** / Consult your health care provider.  Hepatitis A vaccine.** / Consult your health care provider.  Hepatitis B vaccine.** / Consult your health care provider.  Haemophilus influenzae type b (Hib) vaccine.** / Consult your health care provider. **Family history and personal history of  risk and conditions may change your health care provider's recommendations.   This information is not intended to replace advice given to you by your health care  provider. Make sure you discuss any questions you have with your health care provider.   Document Released: 11/05/2001 Document Revised: 09/30/2014 Document Reviewed: 02/04/2011 Elsevier Interactive Patient Education [employer].

## 2016-07-12 NOTE — Progress Notes (Signed)
This encounter was created in error - please disregard.

## 2016-07-12 NOTE — Assessment & Plan Note (Signed)
Patient encouraged to maintain heart healthy diet, regular exercise, adequate sleep. Consider daily probiotics. Take medications as prescribed. Given and reviewed copy of ACP documents from [place] [employer] [place] and encouraged to complete and return 

## 2016-07-12 NOTE — Assessment & Plan Note (Signed)
Encouraged heart healthy diet, increase exercise, avoid trans fats, consider a krill oil cap daily 

## 2016-07-12 NOTE — Progress Notes (Signed)
Patient ID: Francis Shaw, male   DOB: 1940-10-18, 75 y.o.   MRN: [id number]   Subjective:    Patient ID: Francis Shaw, male    DOB: August 07, 1941, 75 y.o.   MRN: [id number]  No chief complaint on file.   HPI Patient is in today for annual preventative exam. He is doing very well. No recent illness or acute concerns. He is doing well at home with ADLs and they have been doing some traveling. No falls or hospitalizations. Denies CP/palp/SOB/HA/congestion/fevers/GI or GU c/o. Taking meds as prescribed  Past Medical History:  Diagnosis Date  . Allergy   . Atrial fibrillation ([place])    a. Eliquis initiated 04/2013.  Marland Kitchen Atrial flutter ([place])   . Dermatitis 11/22/2012  . Esophageal reflux 04/12/2013  . Hypertension   . Nonischemic cardiomyopathy ([place])    a. presumed to be tachy mediated;  b. 03/2013 Echo: EF 30-35%, diff HK, mild LVH, mildly dil LA.  Marland Kitchen Pericarditis   . Skin lesion 11/26/2011    Past Surgical History:  Procedure Laterality Date  . CALCANEAL OSTEOTOMY Right 1993   "stapled it; screwed it" (04/28/2013)  . CALCANEUS HARDWARE REMOVAL Right ~ 1994  . COLONOSCOPY     10-12 years ago; unable to find records  . 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 LEFT HEART CATHETERIZATION WITH CORONARY ANGIOGRAM N/A 09/30/2013   Procedure: LEFT HEART CATHETERIZATION WITH CORONARY ANGIOGRAM;  Surgeon: Burnell Blanks, MD;  Location: [facility] CATH LAB;  Service: Cardiovascular;  Laterality: N/A;  . TONSILLECTOMY  1940's    Family History  Problem Relation Age of Onset  . Stroke Father   . Asthma Mother     low back pain  . Colon cancer Neg Hx   . Esophageal cancer Neg Hx   . Rectal cancer Neg Hx   . Stomach cancer Neg Hx     Social History   Social History  . Marital status: Married    Spouse name: N/A  . Number of children: 2  . Years of education: N/A   Occupational History  . self emp    Social History Main Topics  . Smoking status: Former Smoker    Packs/day: 1.00      Years: 18.00    Types: Cigarettes    Quit date: 09/24/1979  . Smokeless tobacco: Never Used  . Alcohol use 0.0 oz/week     Comment: occasionally   . Drug use: No  . Sexual activity: Yes    Partners: Female   Other Topics Concern  . Not on file   Social History Narrative  . No narrative on file    Outpatient Medications Prior to Visit  Medication Sig Dispense Refill  . acetaminophen (TYLENOL) 500 MG tablet Take 1,000 mg by mouth every 6 (six) hours as needed for mild pain or headache.    . carvedilol (COREG) 12.5 MG tablet Take 1 tablet (12.5 mg total) by mouth 2 (two) times daily with a meal. 180 tablet 3  . cetirizine (ZYRTEC) 10 MG tablet Take 1 tablet (10 mg total) by mouth daily as needed for allergies. 30 tablet 1  . clobetasol cream (TEMOVATE) [id number] % Apply 1 application topically 2 (two) times daily as needed (anal itch). Reported on 11/07/2015 30 g 0  . fluticasone (FLONASE) 50 MCG/ACT nasal spray Place 2 sprays into both nostrils daily. (Patient taking differently: Place 2 sprays into both nostrils daily as needed. ) 16 g 3  . losartan (  COZAAR) 25 MG tablet Take 1 tablet (25 mg total) by mouth daily. 90 tablet 1  . Multiple Vitamins-Minerals (CENTRUM SILVER PO) Take 1 tablet by mouth daily.    Alveda Reasons 20 MG TABS tablet TAKE 1 TABLET BY MOUTH EVERY DAY WITH DINNER 30 tablet 6  . loratadine (CLARITIN) 10 MG tablet Take 1 tablet (10 mg total) by mouth daily. (Patient not taking: Reported on 07/11/2016) 30 tablet 0  . 0.9 %  sodium chloride infusion      No facility-administered medications prior to visit.     No Known Allergies  Review of Systems  Constitutional: Negative for chills, fever and malaise/fatigue.  HENT: Negative for congestion and hearing loss.   Eyes: Negative for discharge.  Respiratory: Negative for cough, sputum production and shortness of breath.   Cardiovascular: Negative for chest pain, palpitations and leg swelling.  Gastrointestinal: Negative  for abdominal pain, blood in stool, constipation, diarrhea, heartburn, nausea and vomiting.  Genitourinary: Negative for dysuria, frequency, hematuria and urgency.  Musculoskeletal: Negative for back pain, falls and myalgias.  Skin: Negative for rash.  Neurological: Negative for dizziness, sensory change, loss of consciousness, weakness and headaches.  Endo/Heme/Allergies: Negative for environmental allergies. Does not bruise/bleed easily.  Psychiatric/Behavioral: Negative for depression and suicidal ideas. The patient is not nervous/anxious and does not have insomnia.        Objective:    Physical Exam  Constitutional: He is oriented to person, place, and time. He appears well-developed and well-nourished. No distress.  HENT:  Head: Normocephalic and atraumatic.  Eyes: Conjunctivae are normal.  Neck: Neck supple. No thyromegaly present.  Cardiovascular: Normal rate, regular rhythm and normal heart sounds.   No murmur heard. Pulmonary/Chest: Effort normal and breath sounds normal. No respiratory distress. He has no wheezes.  Abdominal: Soft. Bowel sounds are normal. He exhibits no mass. There is no tenderness.  Musculoskeletal: He exhibits no edema.  Lymphadenopathy:    He has no cervical adenopathy.  Neurological: He is alert and oriented to person, place, and time.  Skin: Skin is warm and dry.  Psychiatric: He has a normal mood and affect. His behavior is normal.    BP 114/62 (BP Location: Right Arm, Patient Position: Sitting, Cuff Size: Normal)   Pulse 70   Resp 14   Ht 5' 9.5" (1.765 m)   Wt 179 lb 12.8 oz (81.6 kg)   SpO2 97%   BMI 26.17 kg/m  Wt Readings from Last 3 Encounters:  07/11/16 179 lb 12.8 oz (81.6 kg)  05/29/16 176 lb (79.8 kg)  05/21/16 179 lb (81.2 kg)     Lab Results  Component Value Date   WBC 8.5 05/29/2016   HGB 15.5 05/29/2016   HCT 44.1 05/29/2016   PLT 231 05/29/2016   GLUCOSE 98 05/29/2016   CHOL 161 11/07/2015   TRIG 106.0 11/07/2015    HDL 49.50 11/07/2015   LDLCALC 90 11/07/2015   ALT 16 11/07/2015   AST 17 11/07/2015   NA 142 05/29/2016   K 4.7 05/29/2016   CL 106 05/29/2016   CREATININE 0.93 05/29/2016   BUN 15 05/29/2016   CO2 30 05/29/2016   TSH 1.32 11/07/2015   PSA 2.28 11/26/2011   INR 1.26 09/27/2013   HGBA1C 5.2 04/29/2013    Lab Results  Component Value Date   TSH 1.32 11/07/2015   Lab Results  Component Value Date   WBC 8.5 05/29/2016   HGB 15.5 05/29/2016   HCT 44.1 05/29/2016  MCV 87.2 05/29/2016   PLT 231 05/29/2016   Lab Results  Component Value Date   NA 142 05/29/2016   K 4.7 05/29/2016   CO2 30 05/29/2016   GLUCOSE 98 05/29/2016   BUN 15 05/29/2016   CREATININE 0.93 05/29/2016   BILITOT 0.8 11/07/2015   ALKPHOS 71 11/07/2015   AST 17 11/07/2015   ALT 16 11/07/2015   PROT 6.9 11/07/2015   ALBUMIN 4.1 11/07/2015   CALCIUM 9.5 05/29/2016   GFR 103.63 11/07/2015   Lab Results  Component Value Date   CHOL 161 11/07/2015   Lab Results  Component Value Date   HDL 49.50 11/07/2015   Lab Results  Component Value Date   LDLCALC 90 11/07/2015   Lab Results  Component Value Date   TRIG 106.0 11/07/2015   Lab Results  Component Value Date   CHOLHDL 3 11/07/2015   Lab Results  Component Value Date   HGBA1C 5.2 04/29/2013       Assessment & Plan:   Problem List Items Addressed This Visit    Preventative health care    Patient encouraged to maintain heart healthy diet, regular exercise, adequate sleep. Consider daily probiotics. Take medications as prescribed. Given and reviewed copy of ACP documents from [employer] and encouraged to complete and return      Hyperlipidemia    Encouraged heart healthy diet, increase exercise, avoid trans fats, consider a krill oil cap daily       Other Visit Diagnoses    Encounter for Medicare annual wellness exam    -  Primary      I have discontinued Mr. Custard's loratadine. I am also having him maintain his  Multiple Vitamins-Minerals (CENTRUM SILVER PO), acetaminophen, fluticasone, cetirizine, clobetasol cream, XARELTO, losartan, and carvedilol. We will stop administering sodium chloride.  No orders of the defined types were placed in this encounter.    Penni Homans, MD

## 2016-09-24 ENCOUNTER — Encounter: Payer: Self-pay | Admitting: Cardiology

## 2016-09-24 ENCOUNTER — Encounter: Payer: Self-pay | Admitting: Family Medicine

## 2016-09-24 ENCOUNTER — Telehealth: Payer: Self-pay

## 2016-09-24 MED ORDER — RIVAROXABAN 20 MG PO TABS: ORAL_TABLET | ORAL | 6 refills | Status: DC

## 2016-09-25 ENCOUNTER — Other Ambulatory Visit: Payer: Self-pay | Admitting: Family Medicine

## 2016-09-25 NOTE — Telephone Encounter (Signed)
error 

## 2016-10-10 ENCOUNTER — Other Ambulatory Visit: Payer: Self-pay | Admitting: Cardiology

## 2016-11-02 DIAGNOSIS — J069 Acute upper respiratory infection, unspecified: Secondary | ICD-10-CM | POA: Diagnosis not present

## 2016-11-20 ENCOUNTER — Other Ambulatory Visit: Payer: Self-pay | Admitting: *Deleted

## 2016-11-20 MED ORDER — LOSARTAN POTASSIUM 25 MG PO TABS: ORAL_TABLET | ORAL | 3 refills | Status: DC

## 2016-11-20 NOTE — Telephone Encounter (Signed)
E-SENT TO PHARMACY 90 DAY X 3 REFILL

## 2017-02-11 DIAGNOSIS — M7062 Trochanteric bursitis, left hip: Secondary | ICD-10-CM | POA: Diagnosis not present

## 2017-02-14 DIAGNOSIS — M25552 Pain in left hip: Secondary | ICD-10-CM | POA: Diagnosis not present

## 2017-02-14 DIAGNOSIS — M7062 Trochanteric bursitis, left hip: Secondary | ICD-10-CM | POA: Diagnosis not present

## 2017-03-19 ENCOUNTER — Encounter: Payer: Self-pay | Admitting: Family Medicine

## 2017-03-25 ENCOUNTER — Ambulatory Visit (INDEPENDENT_AMBULATORY_CARE_PROVIDER_SITE_OTHER): Payer: Medicare HMO | Admitting: Family Medicine

## 2017-03-25 ENCOUNTER — Encounter: Payer: Self-pay | Admitting: Family Medicine

## 2017-03-25 DIAGNOSIS — B070 Plantar wart: Secondary | ICD-10-CM | POA: Diagnosis not present

## 2017-03-25 DIAGNOSIS — I4891 Unspecified atrial fibrillation: Secondary | ICD-10-CM | POA: Diagnosis not present

## 2017-03-25 NOTE — Patient Instructions (Addendum)
Cover with salicylic acid bandaid   Plantar Warts Plantar warts are small growths on the bottom of the foot (sole). Warts are caused by a type of germ (virus). Most warts are not painful, and they usually do not cause problems. Sometimes, plantar warts can cause pain when you walk. Warts often go away on their own in time. Treatments may be done if needed. Follow these instructions at home: General instructions  Apply creams or solutions only as told by your doctor. Follow these steps if your doctor tells you to do so: ? Soak your foot in warm water. ? Remove the top layer of softened skin before you apply the medicine. You can use a pumice stone to remove the tissue. ? After you apply the medicine, put a bandage over the area of the wart. ? Repeat the process every day or as told by your doctor.  Do not scratch or pick at a wart.  Wash your hands after you touch a wart.  If a wart is painful, try putting a bandage with a hole in the middle over the wart.  Keep all follow-up visits as told by your doctor. This is important. Prevention  Wear shoes and socks. Change socks every day.  Keep your feet clean and dry.  Check your feet often.  Avoid direct contact with warts on other people. Contact a doctor if:  Your warts do not improve after treatment.  You have redness, swelling, or pain at the site of a wart.  You have bleeding from a wart, and the bleeding does not stop when you put light pressure on the wart.  You have diabetes and you get a wart. This information is not intended to replace advice given to you by your health care provider. Make sure you discuss any questions you have with your health care provider. Document Released: 10/12/2010 Document Revised: 02/15/2016 Document Reviewed: 12/05/2014 Elsevier Interactive Patient Education  [employer].

## 2017-03-25 NOTE — Progress Notes (Signed)
Patient ID: Francis Shaw, male   DOB: October 17, 1940, 76 y.o.   MRN: [id number]     Subjective:  I acted as a Education administrator for Dr. Charlett Blake.  Guerry Bruin, [place].    Patient ID: Francis Shaw, male    DOB: Sep 21, 1941, 76 y.o.   MRN: [id number]  Chief Complaint  Patient presents with  . Plantar Warts    right pinky toe    HPI  Patient is in today for possible wart on right pinky toe.  Has been there for about a couple of months.  Has tried wart remover, iodine, soaks, and ocean water.  It was painful but he went on vacation and walked barefoot into the ocean and it is not in so much pain anymore. No other acute concerns or recent illness. Denies CP/palp/SOB/HA/congestion/fevers/GI or GU c/o. Taking meds as prescribed   Patient Care Team: Mosie Lukes, MD as PCP - General (Family Medicine) Stanford Breed Denice Bors, MD as Consulting Physician (Cardiology)   Past Medical History:  Diagnosis Date  . Allergy   . Atrial fibrillation ([place])    a. Eliquis initiated 04/2013.  Marland Kitchen Atrial flutter ([place])   . Dermatitis 11/22/2012  . Esophageal reflux 04/12/2013  . Hypertension   . Nonischemic cardiomyopathy ([place])    a. presumed to be tachy mediated;  b. 03/2013 Echo: EF 30-35%, diff HK, mild LVH, mildly dil LA.  Marland Kitchen Pericarditis   . Skin lesion 11/26/2011    Past Surgical History:  Procedure Laterality Date  . CALCANEAL OSTEOTOMY Right 1993   "stapled it; screwed it" (04/28/2013)  . CALCANEUS HARDWARE REMOVAL Right ~ 1994  . COLONOSCOPY     10-12 years ago; unable to find records  . 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 LEFT HEART CATHETERIZATION WITH CORONARY ANGIOGRAM N/A 09/30/2013   Procedure: LEFT HEART CATHETERIZATION WITH CORONARY ANGIOGRAM;  Surgeon: Burnell Blanks, MD;  Location: [facility] CATH LAB;  Service: Cardiovascular;  Laterality: N/A;  . TONSILLECTOMY  1940's    Family History  Problem Relation Age of Onset  . Stroke Father   . Asthma Mother        low back pain  . Colon cancer  Neg Hx   . Esophageal cancer Neg Hx   . Rectal cancer Neg Hx   . Stomach cancer Neg Hx     Social History   Social History  . Marital status: Married    Spouse name: N/A  . Number of children: 2  . Years of education: N/A   Occupational History  . self emp    Social History Main Topics  . Smoking status: Former Smoker    Packs/day: 1.00    Years: 18.00    Types: Cigarettes    Quit date: 09/24/1979  . Smokeless tobacco: Never Used  . Alcohol use 0.0 oz/week     Comment: occasionally   . Drug use: No  . Sexual activity: Yes    Partners: Female   Other Topics Concern  . Not on file   Social History Narrative  . No narrative on file    Outpatient Medications Prior to Visit  Medication Sig Dispense Refill  . acetaminophen (TYLENOL) 500 MG tablet Take 1,000 mg by mouth every 6 (six) hours as needed for mild pain or headache.    . carvedilol (COREG) 12.5 MG tablet Take 1 tablet (12.5 mg total) by mouth 2 (two) times daily with a meal. 180 tablet 3  . cetirizine (ZYRTEC) 10  MG tablet Take 1 tablet (10 mg total) by mouth daily as needed for allergies. 30 tablet 1  . clobetasol cream (TEMOVATE) 0.27 % Apply 1 application topically 2 (two) times daily as needed (anal itch). Reported on 11/07/2015 30 g 0  . fluticasone (FLONASE) 50 MCG/ACT nasal spray Place 2 sprays into both nostrils daily. (Patient taking differently: Place 2 sprays into both nostrils daily as needed. ) 16 g 3  . losartan (COZAAR) 25 MG tablet TAKE 1 TABLET(25 MG) BY MOUTH DAILY 90 tablet 3  . Multiple Vitamins-Minerals (CENTRUM SILVER PO) Take 1 tablet by mouth daily.    . rivaroxaban (XARELTO) 20 MG TABS tablet TAKE 1 TABLET BY MOUTH EVERY DAY WITH DINNER 30 tablet 6   No facility-administered medications prior to visit.     No Known Allergies  Review of Systems  Constitutional: Negative for fever and malaise/fatigue.  HENT: Negative for congestion.   Eyes: Negative for blurred vision.  Respiratory:  Negative for cough and shortness of breath.   Cardiovascular: Negative for chest pain, palpitations and leg swelling.  Gastrointestinal: Negative for vomiting.  Musculoskeletal: Negative for back pain.  Skin: Negative for rash.       Wart on right pinky toe.  Neurological: Negative for loss of consciousness and headaches.       Objective:    Physical Exam  BP 126/70 (BP Location: Right Arm, Cuff Size: Normal)   Pulse 73   Temp 97.7 F (36.5 C) (Oral)   Resp 16   Ht 5' 9.5" (1.765 m)   Wt 186 lb 6.4 oz (84.6 kg)   SpO2 95%   BMI 27.13 kg/m  Wt Readings from Last 3 Encounters:  03/25/17 186 lb 6.4 oz (84.6 kg)  07/11/16 179 lb 12.8 oz (81.6 kg)  05/29/16 176 lb (79.8 kg)   BP Readings from Last 3 Encounters:  03/25/17 126/70  07/11/16 114/62  05/29/16 102/69     Immunization History  Administered Date(s) Administered  . Pneumococcal Conjugate-13 05/01/2015  . Pneumococcal Polysaccharide-23 03/05/2012  . Td 09/23/2006  . Tdap 03/05/2012  . Zoster 10/24/2013    Health Maintenance  Topic Date Due  . INFLUENZA VACCINE  04/23/2017  . COLONOSCOPY  05/22/2019  . TETANUS/TDAP  03/05/2022  . PNA vac Low Risk Adult  Completed    Lab Results  Component Value Date   WBC 8.5 05/29/2016   HGB 15.5 05/29/2016   HCT 44.1 05/29/2016   PLT 231 05/29/2016   GLUCOSE 98 05/29/2016   CHOL 161 11/07/2015   TRIG 106.0 11/07/2015   HDL 49.50 11/07/2015   LDLCALC 90 11/07/2015   ALT 16 11/07/2015   AST 17 11/07/2015   NA 142 05/29/2016   K 4.7 05/29/2016   CL 106 05/29/2016   CREATININE 0.93 05/29/2016   BUN 15 05/29/2016   CO2 30 05/29/2016   TSH 1.32 11/07/2015   PSA 2.28 11/26/2011   INR 1.26 09/27/2013   HGBA1C 5.2 04/29/2013    Lab Results  Component Value Date   TSH 1.32 11/07/2015   Lab Results  Component Value Date   WBC 8.5 05/29/2016   HGB 15.5 05/29/2016   HCT 44.1 05/29/2016   MCV 87.2 05/29/2016   PLT 231 05/29/2016   Lab Results  Component  Value Date   NA 142 05/29/2016   K 4.7 05/29/2016   CO2 30 05/29/2016   GLUCOSE 98 05/29/2016   BUN 15 05/29/2016   CREATININE 0.93 05/29/2016   BILITOT 0.8  11/07/2015   ALKPHOS 71 11/07/2015   AST 17 11/07/2015   ALT 16 11/07/2015   PROT 6.9 11/07/2015   ALBUMIN 4.1 11/07/2015   CALCIUM 9.5 05/29/2016   GFR 103.63 11/07/2015   Lab Results  Component Value Date   CHOL 161 11/07/2015   Lab Results  Component Value Date   HDL 49.50 11/07/2015   Lab Results  Component Value Date   LDLCALC 90 11/07/2015   Lab Results  Component Value Date   TRIG 106.0 11/07/2015   Lab Results  Component Value Date   CHOLHDL 3 11/07/2015   Lab Results  Component Value Date   HGBA1C 5.2 04/29/2013         Assessment & Plan:   Problem List Items Addressed This Visit    None      I am having Mr. Meno maintain his Multiple Vitamins-Minerals (CENTRUM SILVER PO), acetaminophen, fluticasone, cetirizine, clobetasol cream, carvedilol, rivaroxaban, and losartan.  No orders of the defined types were placed in this encounter.   CMA served as Education administrator during this visit. History, Physical and Plan performed by medical provider. Documentation and orders reviewed and attested to.  Jerene Dilling, CMA

## 2017-03-26 ENCOUNTER — Encounter: Payer: Self-pay | Admitting: Family Medicine

## 2017-03-26 DIAGNOSIS — B070 Plantar wart: Secondary | ICD-10-CM | POA: Insufficient documentation

## 2017-03-26 HISTORY — DX: Plantar wart: B07.0

## 2017-03-26 NOTE — Assessment & Plan Note (Signed)
Rate controlled. Tolerating Xarelto did not tolerate Eliquis

## 2017-03-26 NOTE — Assessment & Plan Note (Signed)
Cryotherapy applied to lesion on right 5 th toe, he tolerated well, return for repeat cryo in 1-2 weeks or if no response consider referral to podiatry

## 2017-04-02 DIAGNOSIS — D2311 Other benign neoplasm of skin of right eyelid, including canthus: Secondary | ICD-10-CM | POA: Diagnosis not present

## 2017-04-02 DIAGNOSIS — H02105 Unspecified ectropion of left lower eyelid: Secondary | ICD-10-CM | POA: Diagnosis not present

## 2017-04-02 DIAGNOSIS — H02102 Unspecified ectropion of right lower eyelid: Secondary | ICD-10-CM | POA: Diagnosis not present

## 2017-04-03 ENCOUNTER — Encounter: Payer: Self-pay | Admitting: Family Medicine

## 2017-04-07 DIAGNOSIS — M7062 Trochanteric bursitis, left hip: Secondary | ICD-10-CM | POA: Diagnosis not present

## 2017-04-07 DIAGNOSIS — M25552 Pain in left hip: Secondary | ICD-10-CM | POA: Diagnosis not present

## 2017-04-29 DIAGNOSIS — M25552 Pain in left hip: Secondary | ICD-10-CM | POA: Diagnosis not present

## 2017-05-09 ENCOUNTER — Other Ambulatory Visit: Payer: Self-pay | Admitting: Cardiology

## 2017-05-14 DIAGNOSIS — M25552 Pain in left hip: Secondary | ICD-10-CM | POA: Diagnosis not present

## 2017-05-17 ENCOUNTER — Other Ambulatory Visit: Payer: Self-pay | Admitting: Cardiology

## 2017-05-19 NOTE — Telephone Encounter (Signed)
REFILL 

## 2017-06-04 DIAGNOSIS — M25552 Pain in left hip: Secondary | ICD-10-CM | POA: Diagnosis not present

## 2017-06-11 DIAGNOSIS — M1612 Unilateral primary osteoarthritis, left hip: Secondary | ICD-10-CM | POA: Diagnosis not present

## 2017-06-16 NOTE — Progress Notes (Signed)
HPI: FU atrial fibrillation/atrial flutter. Echocardiogram in July of 2014 showed moderate left ventricular enlargement, mild left ventricular hypertrophy and an ejection fraction of 30-35% with diffuse hypokinesis. There was mild left atrial enlargement. TSH in July of 2014 normal. When I initially saw the patient in August of 2014 he was in atrial flutter at a rate of 150 (also with atrial fibrillation on telemetry and flutter ablation not pursued). Patient was scheduled for elective cardioversion but when he came to the procedure he was in sinus rhythm. Cardiac catheterization January 2015 showed no obstructive coronary disease and ejection fraction 35%. Last echo 2/16 showed EF 40-45, grade 1 DD, mild LAE. Abd ultrasound 2/16 showed no aneurysm. Since he was last seen, patient denies dyspnea, chest pain, palpitations or syncope. No bleeding. He has developed significant hip pain and will require hip replacement.  Current Outpatient Prescriptions  Medication Sig Dispense Refill  . acetaminophen (TYLENOL) 500 MG tablet Take 1,000 mg by mouth every 6 (six) hours as needed for mild pain or headache.    . carvedilol (COREG) 12.5 MG tablet Take 1 tablet (12.5 mg total) by mouth 2 (two) times daily with a meal. NEED OV. 180 tablet 0  . cetirizine (ZYRTEC) 10 MG tablet Take 1 tablet (10 mg total) by mouth daily as needed for allergies. 30 tablet 1  . clobetasol cream (TEMOVATE) 6.30 % Apply 1 application topically 2 (two) times daily as needed (anal itch). Reported on 11/07/2015 30 g 0  . losartan (COZAAR) 25 MG tablet TAKE 1 TABLET(25 MG) BY MOUTH DAILY 90 tablet 3  . Multiple Vitamins-Minerals (CENTRUM SILVER PO) Take 1 tablet by mouth daily.    . Naproxen Sodium (ALEVE PO) Take 2 tablets by mouth 3 (three) times daily.    Alveda Reasons 20 MG TABS tablet TAKE 1 TABLET DAILY WITH DINNER 90 tablet 1   No current facility-administered medications for this visit.      Past Medical History:    Diagnosis Date  . Allergy   . Atrial fibrillation ([place])    a. Eliquis initiated 04/2013.  Marland Kitchen Atrial flutter ([place])   . Dermatitis 11/22/2012  . Esophageal reflux 04/12/2013  . Hypertension   . Nonischemic cardiomyopathy ([place])    a. presumed to be tachy mediated;  b. 03/2013 Echo: EF 30-35%, diff HK, mild LVH, mildly dil LA.  Marland Kitchen Pericarditis   . Plantar wart, right foot 03/26/2017  . Skin lesion 11/26/2011    Past Surgical History:  Procedure Laterality Date  . CALCANEAL OSTEOTOMY Right 1993   "stapled it; screwed it" (04/28/2013)  . CALCANEUS HARDWARE REMOVAL Right ~ 1994  . COLONOSCOPY     10-12 years ago; unable to find records  . 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 LEFT HEART CATHETERIZATION WITH CORONARY ANGIOGRAM N/A 09/30/2013   Procedure: LEFT HEART CATHETERIZATION WITH CORONARY ANGIOGRAM;  Surgeon: Burnell Blanks, MD;  Location: [facility] CATH LAB;  Service: Cardiovascular;  Laterality: N/A;  . TONSILLECTOMY  1940's    Social History   Social History  . Marital status: Married    Spouse name: N/A  . Number of children: 2  . Years of education: N/A   Occupational History  . self emp    Social History Main Topics  . Smoking status: Former Smoker    Packs/day: 1.00    Years: 18.00    Types: Cigarettes    Quit date: 09/24/1979  . Smokeless tobacco: Never Used  .  Alcohol use 0.0 oz/week     Comment: occasionally   . Drug use: No  . Sexual activity: Yes    Partners: Female   Other Topics Concern  . Not on file   Social History Narrative  . No narrative on file    Family History  Problem Relation Age of Onset  . Stroke Father   . Asthma Mother        low back pain  . Colon cancer Neg Hx   . Esophageal cancer Neg Hx   . Rectal cancer Neg Hx   . Stomach cancer Neg Hx     ROS: no fevers or chills, productive cough, hemoptysis, dysphasia, odynophagia, melena, hematochezia, dysuria, hematuria, rash, seizure activity, orthopnea, PND, pedal edema,  claudication. Remaining systems are negative.  Physical Exam: Well-developed well-nourished in no acute distress.  Skin is warm and dry.  HEENT is normal.  Neck is supple.  Chest is clear to auscultation with normal expansion.  Cardiovascular exam is irregular Abdominal exam nontender or distended. No masses palpated. Extremities show no edema. neuro grossly intact  ECG- atrial fibrillation at a rate of 97. Left bundle branch block. personally reviewed  A/P  1 paroxysmal atrial fibrillation-patient in atrial fibrillation today. Rate upper normal. Change carvedilol to 18.75 mg twice a day. Continue xarelto. We discussed rhythm control versus rate control. He is not symptomatic and I would therefore favor rate control. Repeat echocardiogram. Check 24-hour Holter monitor to make sure that rate is controlled.   2 history of atrial flutter-we did not proceed with ablation as he also has atrial fibrillation.  3 history of nonischemic cardiomyopathy-plan to repeat echocardiogram. Increase carvedilol to 18.75 mg twice a day. Continue ARB.  4 preoperative evaluation prior to hip replacement- patient has not had chest pain. Previous catheterization revealed no coronary disease. He may proceed without further workup. Hold xarelto 3 days prior to procedure and resume after when okay with orthopedic surgery.   Kirk Ruths, MD

## 2017-06-18 ENCOUNTER — Encounter: Payer: Self-pay | Admitting: Cardiology

## 2017-06-18 ENCOUNTER — Ambulatory Visit (INDEPENDENT_AMBULATORY_CARE_PROVIDER_SITE_OTHER): Payer: Medicare HMO | Admitting: Cardiology

## 2017-06-18 ENCOUNTER — Telehealth: Payer: Self-pay | Admitting: *Deleted

## 2017-06-18 VITALS — BP 122/70 | HR 97 | Ht 70.0 in | Wt 186.0 lb

## 2017-06-18 DIAGNOSIS — I480 Paroxysmal atrial fibrillation: Secondary | ICD-10-CM | POA: Diagnosis not present

## 2017-06-18 DIAGNOSIS — I4819 Other persistent atrial fibrillation: Secondary | ICD-10-CM

## 2017-06-18 DIAGNOSIS — I481 Persistent atrial fibrillation: Secondary | ICD-10-CM | POA: Diagnosis not present

## 2017-06-18 DIAGNOSIS — I420 Dilated cardiomyopathy: Secondary | ICD-10-CM

## 2017-06-18 DIAGNOSIS — I428 Other cardiomyopathies: Secondary | ICD-10-CM

## 2017-06-18 MED ORDER — CARVEDILOL 12.5 MG PO TABS: 18.7500 mg | ORAL_TABLET | Freq: Two times a day (BID) | ORAL | 3 refills | Status: DC

## 2017-06-18 NOTE — Telephone Encounter (Signed)
Office note from today containing clearance for left THA/ANT and recommendations regarding Xarelto faxed to the number provided

## 2017-06-18 NOTE — Patient Instructions (Signed)
Medication Instructions:   INCREASE CARVEDILOL 18.75 MG TWICE DAILY= 1 AND 1/2 OF THE 12.5 MG TABLET TWICE DAILY  Testing/Procedures:  Your physician has requested that you have an echocardiogram. Echocardiography is a painless test that uses sound waves to create images of your heart. It provides your doctor with information about the size and shape of your heart and how well your heart's chambers and valves are working. This procedure takes approximately one hour. There are no restrictions for this procedure.  Your physician has recommended that you wear a 24 HOUR holter monitor. Holter monitors are medical devices that record the heart's electrical activity. Doctors most often use these monitors to diagnose arrhythmias. Arrhythmias are problems with the speed or rhythm of the heartbeat. The monitor is a small, portable device. You can wear one while you do your normal daily activities. This is usually used to diagnose what is causing palpitations/syncope (passing out).      Follow-Up:  Your physician wants you to follow-up in: [place] will receive a reminder letter in the mail two months in advance. If you don't receive a letter, please call our office to schedule the follow-up appointment.   If you need a refill on your cardiac medications before your next appointment, please call your pharmacy.

## 2017-06-28 ENCOUNTER — Encounter (HOSPITAL_BASED_OUTPATIENT_CLINIC_OR_DEPARTMENT_OTHER): Payer: Self-pay | Admitting: *Deleted

## 2017-06-28 ENCOUNTER — Observation Stay (HOSPITAL_BASED_OUTPATIENT_CLINIC_OR_DEPARTMENT_OTHER)
Admission: EM | Admit: 2017-06-28 | Discharge: 2017-06-30 | Disposition: A | Discharge status: Home / Self Care | Payer: Medicare HMO | Attending: Internal Medicine | Admitting: Internal Medicine

## 2017-06-28 DIAGNOSIS — K5791 Diverticulosis of intestine, part unspecified, without perforation or abscess with bleeding: Secondary | ICD-10-CM | POA: Diagnosis present

## 2017-06-28 DIAGNOSIS — I110 Hypertensive heart disease with heart failure: Secondary | ICD-10-CM | POA: Diagnosis not present

## 2017-06-28 DIAGNOSIS — I4891 Unspecified atrial fibrillation: Secondary | ICD-10-CM | POA: Insufficient documentation

## 2017-06-28 DIAGNOSIS — I5022 Chronic systolic (congestive) heart failure: Secondary | ICD-10-CM | POA: Diagnosis not present

## 2017-06-28 DIAGNOSIS — I10 Essential (primary) hypertension: Secondary | ICD-10-CM | POA: Diagnosis not present

## 2017-06-28 DIAGNOSIS — Z7901 Long term (current) use of anticoagulants: Secondary | ICD-10-CM | POA: Insufficient documentation

## 2017-06-28 DIAGNOSIS — K5790 Diverticulosis of intestine, part unspecified, without perforation or abscess without bleeding: Secondary | ICD-10-CM | POA: Diagnosis not present

## 2017-06-28 DIAGNOSIS — D62 Acute posthemorrhagic anemia: Secondary | ICD-10-CM | POA: Diagnosis not present

## 2017-06-28 DIAGNOSIS — I420 Dilated cardiomyopathy: Secondary | ICD-10-CM | POA: Diagnosis present

## 2017-06-28 DIAGNOSIS — Z79899 Other long term (current) drug therapy: Secondary | ICD-10-CM | POA: Diagnosis not present

## 2017-06-28 DIAGNOSIS — K5731 Diverticulosis of large intestine without perforation or abscess with bleeding: Secondary | ICD-10-CM | POA: Diagnosis not present

## 2017-06-28 DIAGNOSIS — K922 Gastrointestinal hemorrhage, unspecified: Secondary | ICD-10-CM | POA: Insufficient documentation

## 2017-06-28 DIAGNOSIS — E785 Hyperlipidemia, unspecified: Secondary | ICD-10-CM | POA: Diagnosis present

## 2017-06-28 DIAGNOSIS — K625 Hemorrhage of anus and rectum: Secondary | ICD-10-CM | POA: Diagnosis not present

## 2017-06-28 DIAGNOSIS — I428 Other cardiomyopathies: Secondary | ICD-10-CM

## 2017-06-28 DIAGNOSIS — Z86010 Personal history of colonic polyps: Secondary | ICD-10-CM | POA: Diagnosis not present

## 2017-06-28 DIAGNOSIS — R195 Other fecal abnormalities: Secondary | ICD-10-CM | POA: Diagnosis present

## 2017-06-28 LAB — COMPREHENSIVE METABOLIC PANEL
ALK PHOS: 69 U/L (ref 38–126)
ALT: 19 U/L (ref 17–63)
AST: 23 U/L (ref 15–41)
Albumin: 3.5 g/dL (ref 3.5–5.0)
Anion gap: 5 (ref 5–15)
BUN: 16 mg/dL (ref 6–20)
CO2: 27 mmol/L (ref 22–32)
CREATININE: 0.79 mg/dL (ref 0.61–1.24)
Calcium: 9 mg/dL (ref 8.9–10.3)
Chloride: 107 mmol/L (ref 101–111)
GFR calc Af Amer: >60 mL/min (ref >60)
Glucose, Bld: 114 mg/dL — ABNORMAL HIGH (ref 65–99)
Potassium: 3.7 mmol/L (ref 3.5–5.1)
Sodium: 139 mmol/L (ref 135–145)
Total Bilirubin: 0.7 mg/dL (ref 0.3–1.2)
Total Protein: 6.3 g/dL — ABNORMAL LOW (ref 6.5–8.1)

## 2017-06-28 LAB — CBC
HCT: 38.3 % — ABNORMAL LOW (ref 39.0–52.0)
HEMOGLOBIN: 13.2 g/dL (ref 13.0–17.0)
MCH: 30.8 pg (ref 26.0–34.0)
MCHC: 34.5 g/dL (ref 30.0–36.0)
MCV: 89.5 fL (ref 78.0–100.0)
Platelets: 184 10*3/uL (ref 150–400)
RBC: 4.28 MIL/uL (ref 4.22–5.81)
RDW: 12.6 % (ref 11.5–15.5)
WBC: 8 10*3/uL (ref 4.0–10.5)

## 2017-06-28 LAB — OCCULT BLOOD X 1 CARD TO LAB, STOOL: FECAL OCCULT BLD: POSITIVE — AB

## 2017-06-28 LAB — PROTIME-INR
INR: 1.27
PROTHROMBIN TIME: 15.8 s — AB (ref 11.4–15.2)

## 2017-06-28 MED ORDER — SODIUM CHLORIDE 0.9 % IV SOLN
INTRAVENOUS | Status: AC
  Administered 2017-06-29: via INTRAVENOUS

## 2017-06-28 MED ORDER — ONDANSETRON HCL 4 MG PO TABS: 4.0000 mg | ORAL_TABLET | Freq: Four times a day (QID) | ORAL | Status: DC | PRN

## 2017-06-28 MED ORDER — ACETAMINOPHEN 500 MG PO TABS: 1000.0000 mg | ORAL_TABLET | Freq: Four times a day (QID) | ORAL | Status: DC | PRN

## 2017-06-28 MED ORDER — ONDANSETRON HCL 4 MG/2ML IJ SOLN: 4.0000 mg | Freq: Four times a day (QID) | INTRAMUSCULAR | Status: DC | PRN

## 2017-06-28 MED ORDER — SODIUM CHLORIDE 0.9 % IV BOLUS (SEPSIS)
1000.0000 mL | Freq: Once | INTRAVENOUS | Status: AC
  Administered 2017-06-28: 1000 mL via INTRAVENOUS

## 2017-06-28 MED ORDER — CARVEDILOL 6.25 MG PO TABS
18.7500 mg | ORAL_TABLET | Freq: Two times a day (BID) | ORAL | Status: DC
  Administered 2017-06-29 – 2017-06-30 (×3): 18.75 mg via ORAL
  Filled 2017-06-28 (×3): qty 1

## 2017-06-28 NOTE — ED Notes (Signed)
[facility] here, no changes, belongings bagged and labeled, wife left for home.

## 2017-06-28 NOTE — ED Triage Notes (Signed)
Pt reports bloody BMs since this past Friday. Reports transient dizziness earlier this morning. Denies n/v, fever.

## 2017-06-28 NOTE — Plan of Care (Addendum)
76 yo M with a.fib on xareto 24h of BRBPR x4 episodes  Fuller Plan did colonoscopy 2017 in the past showed divertculitis  Today felt lightheaded.Hg 13 BUN wnl Rectal exam bright red blood For the past 2h no more bloody BM. Orthostatic when stands up with HR going up  To 130's but comes down to 80 when sitting.PEr ER MD non-toxic apearig.    Accept to tele bed,  Requested consult to [facility] And update in vitals  Sevrin Sally 9:43 PM  ER provider spoke to Dr. Fuller Plan who will see patient in consult tomorrow morning  Provider personally reviewed vitals currently systolic blood pressures in the 140s heart rate 84 vitals that shown up in the computer appears to be   inaccurate  We will except to telemetry bed  Danton Palmateer 10:24 PM

## 2017-06-28 NOTE — ED Notes (Signed)
Up to b/r, steady gait, denies sx. Updated. 2nd NSL established.

## 2017-06-28 NOTE — ED Notes (Signed)
Alert, NAD, calm, interactive, resps e/u, speaking in clear complete sentences, no dyspnea noted, skin W&D, VSS, denies questions or needs, watching football on TV, pending consult/admission, (denies: pain, sob, nausea, dizziness, weakness or visual changes). Family at [facility]. Afib on monitor 95-105.

## 2017-06-28 NOTE — Discharge Instructions (Signed)
Transfer to [facility].

## 2017-06-28 NOTE — ED Provider Notes (Addendum)
[place] DEPT [facility] Provider Note   CSN: [id number] Arrival date & time: 06/28/17  1845     History   Chief Complaint Chief Complaint  Patient presents with  . Rectal Bleeding    HPI Francis Shaw is a 76 y.o. male.  Patient c/o 3 episodes rectal bleeding in past day, states large amount, episodic, recurrent, was red blood, no melena or black stools . Denies rectal pain. No hx gi bleeding, pud, diverticula, or hemorrhoids. Is on xarelto for afib. Denies abd pain. Felt mildly lightheaded earlier. No other abnormal bruising or bleeding. No nsaid use. Denies fever or chills.    The history is provided by the patient.  Rectal Bleeding  Associated symptoms: light-headedness   Associated symptoms: no abdominal pain, no fever and no vomiting     Past Medical History:  Diagnosis Date  . Allergy   . Atrial fibrillation ([place])    a. Eliquis initiated 04/2013.  Marland Kitchen Atrial flutter ([place])   . Dermatitis 11/22/2012  . Esophageal reflux 04/12/2013  . Hypertension   . Nonischemic cardiomyopathy ([place])    a. presumed to be tachy mediated;  b. 03/2013 Echo: EF 30-35%, diff HK, mild LVH, mildly dil LA.  Marland Kitchen Pericarditis   . Plantar wart, right foot 03/26/2017  . Skin lesion 11/26/2011    Patient Active Problem List   Diagnosis Date Noted  . Plantar wart, right foot 03/26/2017  . Allergic rhinitis 12/06/2014  . Bruit 11/02/2014  . Shingles 05/10/2014  . Abdominal bloating 05/10/2014  . Nonischemic cardiomyopathy ([place])   . Atrial fibrillation ([place])   . Congestive dilated cardiomyopathy ([place]) 04/28/2013  . Esophageal reflux 04/12/2013  . SOB (shortness of breath) 04/12/2013  . Right hip pain 03/04/2013  . Dermatitis 11/22/2012  . Hyperlipidemia 03/05/2012  . 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 Skin lesion 11/26/2011  . Elevated BP 11/26/2011  . Preventative health care 11/26/2011    Past Surgical History:  Procedure Laterality Date  . CALCANEAL OSTEOTOMY Right 1993   "stapled it;  screwed it" (04/28/2013)  . CALCANEUS HARDWARE REMOVAL Right ~ 1994  . COLONOSCOPY     10-12 years ago; unable to find records  . 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 LEFT HEART CATHETERIZATION WITH CORONARY ANGIOGRAM N/A 09/30/2013   Procedure: LEFT HEART CATHETERIZATION WITH CORONARY ANGIOGRAM;  Surgeon: Burnell Blanks, MD;  Location: [facility] CATH LAB;  Service: Cardiovascular;  Laterality: N/A;  . TONSILLECTOMY  1940's       Home Medications    Prior to Admission medications   Medication Sig Start Date End Date Taking? Authorizing Provider  acetaminophen (TYLENOL) 500 MG tablet Take 1,000 mg by mouth every 6 (six) hours as needed for mild pain or headache.   Yes [provider]  carvedilol (COREG) 12.5 MG tablet Take 1.5 tablets (18.75 mg total) by mouth 2 (two) times daily with a meal. 06/18/17  Yes Crenshaw, Denice Bors, MD  cetirizine (ZYRTEC) 10 MG tablet Take 1 tablet (10 mg total) by mouth daily as needed for allergies. 12/07/15  Yes Mosie Lukes, MD  clobetasol cream (TEMOVATE) 1.76 % Apply 1 application topically 2 (two) times daily as needed (anal itch). Reported on 11/07/2015 12/07/15  Yes Mosie Lukes, MD  losartan (COZAAR) 25 MG tablet TAKE 1 TABLET(25 MG) BY MOUTH DAILY 11/20/16  Yes Lelon Perla, MD  Multiple Vitamins-Minerals (CENTRUM SILVER PO) Take 1 tablet by mouth daily.   Yes [provider]  XARELTO 20 MG TABS tablet TAKE 1 TABLET DAILY WITH DINNER 05/12/17  Yes Crenshaw, Denice Bors, MD  Naproxen Sodium (ALEVE PO) Take 2 tablets by mouth 3 (three) times daily.    [provider]    Family History Family History  Problem Relation Age of Onset  . Stroke Father   . Asthma Mother        low back pain  . Colon cancer Neg Hx   . Esophageal cancer Neg Hx   . Rectal cancer Neg Hx   . Stomach cancer Neg Hx     Social History Social History  Substance Use Topics  . Smoking status: Former Smoker    Packs/day: 1.00     Years: 18.00    Types: Cigarettes    Quit date: 09/24/1979  . Smokeless tobacco: Never Used  . Alcohol use 0.0 oz/week     Comment: occasionally      Allergies   Patient has no known allergies.   Review of Systems Review of Systems  Constitutional: Negative for fever.  HENT: Negative for sore throat.   Eyes: Negative for redness.  Respiratory: Negative for shortness of breath.   Cardiovascular: Negative for chest pain.  Gastrointestinal: Positive for blood in stool and hematochezia. Negative for abdominal pain and vomiting.  Genitourinary: Negative for flank pain.  Musculoskeletal: Negative for back pain and neck pain.  Skin: Negative for rash.  Neurological: Positive for light-headedness. Negative for headaches.  Hematological: Does not bruise/bleed easily.  Psychiatric/Behavioral: Negative for confusion.     Physical Exam Updated Vital Signs BP 117/77 (BP Location: Left Arm)   Pulse 68   Temp 98.2 F (36.8 C) (Oral)   Resp 20   Ht 1.778 m (5\' 10" )   Wt 84.4 kg (186 lb)   SpO2 98%   BMI 26.69 kg/m   Physical Exam  Constitutional: He is oriented to person, place, and time. He appears well-developed and well-nourished. No distress.  HENT:  Mouth/Throat: Oropharynx is clear and moist.  Eyes: Conjunctivae are normal.  Neck: Neck supple. No tracheal deviation present.  Cardiovascular: Normal rate, regular rhythm, normal heart sounds and intact distal pulses.   Pulmonary/Chest: Effort normal and breath sounds normal. No accessory muscle usage. No respiratory distress.  Abdominal: Soft. Bowel sounds are normal. He exhibits no distension and no mass. There is no tenderness. There is no rebound and no guarding. No hernia.  Genitourinary:  Genitourinary Comments: No stool, scant amount red blood.   Musculoskeletal: He exhibits no edema.  Neurological: He is alert and oriented to person, place, and time.  Skin: Skin is warm and dry. No rash noted. He is not diaphoretic.    Psychiatric: He has a normal mood and affect.  Nursing note and vitals reviewed.    ED Treatments / Results  Labs (all labs ordered are listed, but only abnormal results are displayed) Results for orders placed or performed during the hospital encounter of 06/28/17  CBC  Result Value Ref Range   WBC 8.0 4.0 - 10.5 K/uL   RBC 4.28 4.22 - 5.81 MIL/uL   Hemoglobin 13.2 13.0 - 17.0 g/dL   HCT 38.3 (L) 39.0 - 52.0 %   MCV 89.5 78.0 - 100.0 fL   MCH 30.8 26.0 - 34.0 pg   MCHC 34.5 30.0 - 36.0 g/dL   RDW 12.6 11.5 - 15.5 %   Platelets 184 150 - 400 K/uL  Comprehensive metabolic panel  Result Value Ref Range   Sodium 139  135 - 145 mmol/L   Potassium 3.7 3.5 - 5.1 mmol/L   Chloride 107 101 - 111 mmol/L   CO2 27 22 - 32 mmol/L   Glucose, Bld 114 (H) 65 - 99 mg/dL   BUN 16 6 - 20 mg/dL   Creatinine, Ser 0.79 0.61 - 1.24 mg/dL   Calcium 9.0 8.9 - 10.3 mg/dL   Total Protein 6.3 (L) 6.5 - 8.1 g/dL   Albumin 3.5 3.5 - 5.0 g/dL   AST 23 15 - 41 U/L   ALT 19 17 - 63 U/L   Alkaline Phosphatase 69 38 - 126 U/L   Total Bilirubin 0.7 0.3 - 1.2 mg/dL   GFR calc non Af Amer >60 >60 mL/min   GFR calc Af Amer >60 >60 mL/min   Anion gap 5 5 - 15  Protime-INR  Result Value Ref Range   Prothrombin Time 15.8 (H) 11.4 - 15.2 seconds   INR 1.27   Occult blood card to lab, stool Provider will collect  Result Value Ref Range   Fecal Occult Bld POSITIVE (A) NEGATIVE    EKG  EKG Interpretation None       Radiology No results found.  Procedures Procedures (including critical care time)  Medications Ordered in ED Medications  sodium chloride 0.9 % bolus 1,000 mL (1,000 mLs Intravenous New Bag/Given 06/28/17 1942)     Initial Impression / Assessment and Plan / ED Course  I have reviewed the triage vital signs and the nursing notes.  Pertinent labs & imaging results that were available during my care of the patient were reviewed by me and considered in my medical decision making (see  chart for details).  Iv ns. Labs.  Reviewed nursing notes and prior charts for additional history.  Although pt notes no hx diverticula - colonscopy from 2017 (Dr Barth Kirks) made notes of colon diverticula, and also small internal hemorrhoid.   With checking orthostatics, hr increases to 130, but bp remains stable.  Given multiple episodes bleeding, on blood thinner, 2 gm drop in hgb from last check, will admit.    Pt prefers [place] system - paged hospitalist at [facility]  Discussed w Dr Roel Cluck - she will admit, requests we can gi.  I discussed with Dr Fuller Plan  - they will see tomorrow AM at [facility].     Final Clinical Impressions(s) / ED Diagnoses   Final diagnoses:  None    New Prescriptions New Prescriptions   No medications on file        Lajean Saver, MD 06/28/17 2210

## 2017-06-29 DIAGNOSIS — Z86010 Personal history of colonic polyps: Secondary | ICD-10-CM | POA: Diagnosis not present

## 2017-06-29 DIAGNOSIS — K922 Gastrointestinal hemorrhage, unspecified: Secondary | ICD-10-CM

## 2017-06-29 DIAGNOSIS — I4891 Unspecified atrial fibrillation: Secondary | ICD-10-CM

## 2017-06-29 DIAGNOSIS — D62 Acute posthemorrhagic anemia: Secondary | ICD-10-CM

## 2017-06-29 DIAGNOSIS — K5731 Diverticulosis of large intestine without perforation or abscess with bleeding: Secondary | ICD-10-CM | POA: Diagnosis not present

## 2017-06-29 DIAGNOSIS — K625 Hemorrhage of anus and rectum: Secondary | ICD-10-CM

## 2017-06-29 LAB — CBC
HEMATOCRIT: 35.4 % — AB (ref 39.0–52.0)
HEMOGLOBIN: 12.1 g/dL — AB (ref 13.0–17.0)
MCH: 30.6 pg (ref 26.0–34.0)
MCHC: 34.2 g/dL (ref 30.0–36.0)
MCV: 89.6 fL (ref 78.0–100.0)
Platelets: 161 10*3/uL (ref 150–400)
RBC: 3.95 MIL/uL — ABNORMAL LOW (ref 4.22–5.81)
RDW: 12.7 % (ref 11.5–15.5)
WBC: 7 10*3/uL (ref 4.0–10.5)

## 2017-06-29 LAB — BASIC METABOLIC PANEL
ANION GAP: 6 (ref 5–15)
BUN: 13 mg/dL (ref 6–20)
CHLORIDE: 113 mmol/L — AB (ref 101–111)
CO2: 26 mmol/L (ref 22–32)
Calcium: 8.7 mg/dL — ABNORMAL LOW (ref 8.9–10.3)
Creatinine, Ser: 0.71 mg/dL (ref 0.61–1.24)
GFR calc non Af Amer: >60 mL/min (ref >60)
Glucose, Bld: 100 mg/dL — ABNORMAL HIGH (ref 65–99)
Potassium: 3.8 mmol/L (ref 3.5–5.1)
Sodium: 145 mmol/L (ref 135–145)

## 2017-06-29 NOTE — Care Management CC44 (Signed)
Condition Code 44 Documentation Completed  Patient Details  Name: Francis Shaw MRN: [id number] Date of Birth: June 05, 1941   Condition Code 44 given:  Yes Patient signature on Condition Code 44 notice:  Yes Documentation of 2 MD's agreement:  Yes Code 44 added to claim:  Yes    Erenest Rasher, RN 06/29/2017, 6:18 PM

## 2017-06-29 NOTE — Progress Notes (Signed)
Patient had three small BM's throughout shift (10/7, 7a-7p). Small amount of red blood streaks seen on first BM. Second and third BM were brown without blood.

## 2017-06-29 NOTE — H&P (Signed)
History and Physical    Francis Shaw XIP:[id number] DOB: Apr 07, 1941 DOA: 06/28/2017  PCP: Mosie Lukes, MD  Patient coming from: Home  I have personally briefly reviewed patient's old medical records in [place]  Chief Complaint: BRBPR  HPI: Francis Shaw is a 76 y.o. male with medical history significant of A.Fib on Xarelto, NICM with EF 40-45%, HTN.  Patient presents to the ED at [facility] with c/o BRBPR for past 24 hours.  4 episodes total.  First was dark, then turned bright red, last one was dilute.  No further episodes since then.  No abd pain.  Does have history of diverticulosis on colonoscopy last done last year.   ED Course: HGB 13.2.  No further episodes.   Review of Systems: As per HPI otherwise 10 point review of systems negative.   Past Medical History:  Diagnosis Date  . Allergy   . Atrial fibrillation ([place])    a. Eliquis initiated 04/2013.  Marland Kitchen Atrial flutter ([place])   . Dermatitis 11/22/2012  . Esophageal reflux 04/12/2013  . Hypertension   . Nonischemic cardiomyopathy ([place])    a. presumed to be tachy mediated;  b. 03/2013 Echo: EF 30-35%, diff HK, mild LVH, mildly dil LA.  Marland Kitchen Pericarditis   . Plantar wart, right foot 03/26/2017  . Skin lesion 11/26/2011    Past Surgical History:  Procedure Laterality Date  . CALCANEAL OSTEOTOMY Right 1993   "stapled it; screwed it" (04/28/2013)  . CALCANEUS HARDWARE REMOVAL Right ~ 1994  . COLONOSCOPY     10-12 years ago; unable to find records  . 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 LEFT HEART CATHETERIZATION WITH CORONARY ANGIOGRAM N/A 09/30/2013   Procedure: LEFT HEART CATHETERIZATION WITH CORONARY ANGIOGRAM;  Surgeon: Burnell Blanks, MD;  Location: [facility] CATH LAB;  Service: Cardiovascular;  Laterality: N/A;  . TONSILLECTOMY  1940's     reports that he quit smoking about 37 years ago. His smoking use included Cigarettes. He has a 18.00 pack-year smoking history. He has never used smokeless tobacco. He  reports that he drinks alcohol. He reports that he does not use drugs.  No Known Allergies  Family History  Problem Relation Age of Onset  . Stroke Father   . Asthma Mother        low back pain  . Colon cancer Neg Hx   . Esophageal cancer Neg Hx   . Rectal cancer Neg Hx   . Stomach cancer Neg Hx      Prior to Admission medications   Medication Sig Start Date End Date Taking? Authorizing Provider  acetaminophen (TYLENOL) 500 MG tablet Take 1,000 mg by mouth every 6 (six) hours as needed for mild pain or headache.   Yes [provider]  carvedilol (COREG) 12.5 MG tablet Take 1.5 tablets (18.75 mg total) by mouth 2 (two) times daily with a meal. 06/18/17  Yes Crenshaw, Denice Bors, MD  cetirizine (ZYRTEC) 10 MG tablet Take 1 tablet (10 mg total) by mouth daily as needed for allergies. 12/07/15  Yes Mosie Lukes, MD  clobetasol cream (TEMOVATE) 6.73 % Apply 1 application topically 2 (two) times daily as needed (anal itch). Reported on 11/07/2015 12/07/15  Yes Mosie Lukes, MD  losartan (COZAAR) 25 MG tablet TAKE 1 TABLET(25 MG) BY MOUTH DAILY 11/20/16  Yes Lelon Perla, MD  Multiple Vitamins-Minerals (CENTRUM SILVER PO) Take 1 tablet by mouth daily.   Yes [provider]  Naproxen Sodium (ALEVE PO) Take 2 tablets by mouth 3 (three) times daily.    [provider]    Physical Exam: Vitals:   06/28/17 2223 06/28/17 2230 06/28/17 2245 06/28/17 2337  BP: 139/80 128/81 137/83 134/87  Pulse:    77  Resp: (!) 24 (!) 25 (!) 24 20  Temp:    97.7 F (36.5 C)  TempSrc:    Oral  SpO2: 94% 94% 95% 97%  Weight:    80.9 kg (178 lb 5.6 oz)  Height:    5\' 10"  (1.778 m)    Constitutional: NAD, calm, comfortable Eyes: PERRL, lids and conjunctivae normal ENMT: Mucous membranes are moist. Posterior pharynx clear of any exudate or lesions.Normal dentition.  Neck: normal, supple, no masses, no thyromegaly Respiratory: clear to auscultation bilaterally, no wheezing, no  crackles. Normal respiratory effort. No accessory muscle use.  Cardiovascular: Regular rate and rhythm, no murmurs / rubs / gallops. No extremity edema. 2+ pedal pulses. No carotid bruits.  Abdomen: no tenderness, no masses palpated. No hepatosplenomegaly. Bowel sounds positive.  Musculoskeletal: no clubbing / cyanosis. No joint deformity upper and lower extremities. Good ROM, no contractures. Normal muscle tone.  Skin: no rashes, lesions, ulcers. No induration Neurologic: CN 2-12 grossly intact. Sensation intact, DTR normal. Strength 5/5 in all 4.  Psychiatric: Normal judgment and insight. Alert and oriented x 3. Normal mood.    Labs on Admission: I have personally reviewed following labs and imaging studies  CBC:  Recent Labs Lab 06/28/17 1937  WBC 8.0  HGB 13.2  HCT 38.3*  MCV 89.5  PLT 161   Basic Metabolic Panel:  Recent Labs Lab 06/28/17 1937  NA 139  K 3.7  CL 107  CO2 27  GLUCOSE 114*  BUN 16  CREATININE 0.79  CALCIUM 9.0   GFR: Estimated Creatinine Clearance: 83.6 mL/min (by C-G formula based on SCr of 0.79 mg/dL). Liver Function Tests:  Recent Labs Lab 06/28/17 1937  AST 23  ALT 19  ALKPHOS 69  BILITOT 0.7  PROT 6.3*  ALBUMIN 3.5   No results for input(s): LIPASE, AMYLASE in the last 168 hours. No results for input(s): AMMONIA in the last 168 hours. Coagulation Profile:  Recent Labs Lab 06/28/17 1937  INR 1.27   Cardiac Enzymes: No results for input(s): CKTOTAL, CKMB, CKMBINDEX, TROPONINI in the last 168 hours. BNP (last 3 results) No results for input(s): PROBNP in the last 8760 hours. HbA1C: No results for input(s): HGBA1C in the last 72 hours. CBG: No results for input(s): GLUCAP in the last 168 hours. Lipid Profile: No results for input(s): CHOL, HDL, LDLCALC, TRIG, CHOLHDL, LDLDIRECT in the last 72 hours. Thyroid Function Tests: No results for input(s): TSH, T4TOTAL, FREET4, T3FREE, THYROIDAB in the last 72 hours. Anemia Panel: No  results for input(s): VITAMINB12, FOLATE, FERRITIN, TIBC, IRON, RETICCTPCT in the last 72 hours. Urine analysis: No results found for: COLORURINE, APPEARANCEUR, LABSPEC, PHURINE, GLUCOSEU, HGBUR, BILIRUBINUR, KETONESUR, PROTEINUR, UROBILINOGEN, NITRITE, LEUKOCYTESUR  Radiological Exams on Admission: No results found.  EKG: Independently reviewed.  Assessment/Plan Principal Problem:   BRBPR (bright red blood per rectum) Active Problems:   Hyperlipidemia   Congestive dilated cardiomyopathy (HCC)   Nonischemic cardiomyopathy (HCC)   Atrial fibrillation (HCC)   Occult blood in stools   Lower GI bleed    1. BRBPR - 1. Holding xarelto 2. Getting 1L IVF 3. Tele monitor 4. GI to seen in AM 5. Clear liquid diet 2. A.Fib - 1. Continue carvedilol for  rate control 2. Hold Xarelto 3. HTN - 1. Continue carvedilol, will hold lisinopril for now.  DVT prophylaxis: SCDs Code Status: Full Family Communication: No family in room Disposition Plan: Home after admit Consults called: EDP spoke with Dr. Alphonsus Sias Admission status: Admit to inpatient   [place], [place] [facility] Pager [phone]  If 7AM-7PM, please contact day team taking care of patient [email or web address] Password [email or web address]  06/29/2017, 12:23 AM

## 2017-06-29 NOTE — Consult Note (Signed)
Consult Note   Referring Provider: Barton Dubois, MD Primary Care Physician:  Mosie Lukes, MD Primary Gastroenterologist:  Lucio Edward, MD  Reason for Consultation:  hematochezia  Impression/ Recommendations:  1. Acute diverticular bleed, which appears to have stopped. Advance to full liquids. Monitor for rebleeding. If no rebleeding could be discharged tomorrow. No plans for repeat colonoscopy. OK to follow up with PCP. Does not need GI follow up at this time.   2. ABL anemia. Trend CBC.   3. Afib, anticoagulation. Hold Xarelto for at least 6 more days. If no rebleeding for next 6 days can resume. No ASA/NSAIDs for 2 weeks.   4. Personal history of adenomatous colon polyps. Surveillance colonoscopy 04/2019.    HPI: Francis Shaw is a 76 y.o. male with the acute onset of painless hematochezia for 1 day. Maintained on Xarelto for afib. Bleeding has stopped overnight. Hb dropped to 12.1. Denies weight loss, abdominal pain, constipation, diarrhea, change in stool caliber, melena,  nausea, vomiting, dysphagia, reflux symptoms, chest pain.  Colonoscopy 04/2016  -A 12 mm polyp was found in the sigmoid colon. The polyp was pedunculated. The polyp was removed with a hot snare. Resection and retrieval were complete. (tubular adenoma) - Multiple small-mouthed diverticula were found in the sigmoid colon and descending colon. There was no evidence of diverticular bleeding. - A few small-mouthed diverticula were found in the transverse colon. There was no evidence of diverticular bleeding. - Internal hemorrhoids were found during retroflexion. The hemorrhoids were small and Grade I (internal hemorrhoids that do not prolapse). - The exam was otherwise normal throughout the examined colon.  Past Medical History:  Diagnosis Date  . Allergy   . Atrial fibrillation ([place])    a. Eliquis initiated 04/2013.  Marland Kitchen Atrial flutter ([place])   . Dermatitis 11/22/2012  . Esophageal reflux 04/12/2013  .  Hypertension   . Nonischemic cardiomyopathy ([place])    a. presumed to be tachy mediated;  b. 03/2013 Echo: EF 30-35%, diff HK, mild LVH, mildly dil LA.  Marland Kitchen Pericarditis   . Plantar wart, right foot 03/26/2017  . Skin lesion 11/26/2011    Past Surgical History:  Procedure Laterality Date  . CALCANEAL OSTEOTOMY Right 1993   "stapled it; screwed it" (04/28/2013)  . CALCANEUS HARDWARE REMOVAL Right ~ 1994  . COLONOSCOPY     10-12 years ago; unable to find records  . 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 LEFT HEART CATHETERIZATION WITH CORONARY ANGIOGRAM N/A 09/30/2013   Procedure: LEFT HEART CATHETERIZATION WITH CORONARY ANGIOGRAM;  Surgeon: Burnell Blanks, MD;  Location: [facility] CATH LAB;  Service: Cardiovascular;  Laterality: N/A;  . TONSILLECTOMY  1940's    Prior to Admission medications   Medication Sig Start Date End Date Taking? Authorizing Provider  acetaminophen (TYLENOL) 500 MG tablet Take 1,000 mg by mouth every 6 (six) hours as needed for mild pain or headache.   Yes [provider]  carvedilol (COREG) 12.5 MG tablet Take 1.5 tablets (18.75 mg total) by mouth 2 (two) times daily with a meal. 06/18/17  Yes Crenshaw, Denice Bors, MD  cetirizine (ZYRTEC) 10 MG tablet Take 1 tablet (10 mg total) by mouth daily as needed for allergies. 12/07/15  Yes Mosie Lukes, MD  clobetasol cream (TEMOVATE) 1.61 % Apply 1 application topically 2 (two) times daily as needed (anal itch). Reported on 11/07/2015 12/07/15  Yes Mosie Lukes, MD  losartan (COZAAR) 25 MG tablet TAKE 1 TABLET(25 MG) BY  MOUTH DAILY 11/20/16  Yes Lelon Perla, MD  Multiple Vitamins-Minerals (CENTRUM SILVER PO) Take 1 tablet by mouth daily.   Yes [provider]  Naproxen Sodium (ALEVE PO) Take 2 tablets by mouth 3 (three) times daily.    [provider]    Current Facility-Administered Medications  Medication Dose Route Frequency Provider Last Rate Last Dose  . 0.9 %  sodium chloride  infusion   Intravenous STAT Lajean Saver, MD 150 mL/hr at 06/29/17 0001    . acetaminophen (TYLENOL) tablet 1,000 mg  1,000 mg Oral Q6H PRN Etta Quill, DO      . carvedilol (COREG) tablet 18.75 mg  18.75 mg Oral BID WC Jennette Kettle M, DO   18.75 mg at 06/29/17 0902  . ondansetron (ZOFRAN) tablet 4 mg  4 mg Oral Q6H PRN Etta Quill, DO       Or  . ondansetron [facility]) injection 4 mg  4 mg Intravenous Q6H PRN Etta Quill, DO        Allergies as of 06/28/2017  . (No Known Allergies)    Family History  Problem Relation Age of Onset  . Stroke Father   . Asthma Mother        low back pain  . Colon cancer Neg Hx   . Esophageal cancer Neg Hx   . Rectal cancer Neg Hx   . Stomach cancer Neg Hx     Social History   Social History  . Marital status: Married    Spouse name: N/A  . Number of children: 2  . Years of education: N/A   Occupational History  . self emp    Social History Main Topics  . Smoking status: Former Smoker    Packs/day: 1.00    Years: 18.00    Types: Cigarettes    Quit date: 09/24/1979  . Smokeless tobacco: Never Used  . Alcohol use 0.0 oz/week     Comment: occasionally   . Drug use: No  . Sexual activity: Yes    Partners: Female   Other Topics Concern  . Not on file   Social History Narrative  . No narrative on file    Review of Systems: Gen: Denies any fever, chills, sweats, anorexia, fatigue, weakness, malaise, weight loss, and sleep disorder CV: Denies chest pain, angina, palpitations, syncope, orthopnea, PND, peripheral edema, and claudication. Resp: Denies dyspnea at rest, dyspnea with exercise, cough, sputum, wheezing, coughing up blood, and pleurisy. GI: Denies vomiting blood, jaundice, and fecal incontinence.   Denies dysphagia or odynophagia. GU : Denies urinary burning, blood in urine, urinary frequency, urinary hesitancy, nocturnal urination, and urinary incontinence. MS: Denies joint pain, limitation of movement, and  swelling, stiffness, low back pain, extremity pain. Denies muscle weakness, cramps, atrophy.  Derm: Denies rash, itching, dry skin, hives, moles, warts, or unhealing ulcers.  Psych: Denies depression, anxiety, memory loss, suicidal ideation, hallucinations, paranoia, and confusion. Heme: Denies bruising, bleeding, and enlarged lymph nodes. Neuro:  Denies any headaches, dizziness, paresthesias. Endo:  Denies any problems with DM, thyroid, adrenal function.  Physical Exam: Vital signs in last 24 hours: Temp:  [97.7 F (36.5 C)-99 F (37.2 C)] 97.8 F (36.6 C) (10/07 0530) Pulse Rate:  [45-146] 87 (10/07 0530) Resp:  [18-28] 18 (10/07 0530) BP: (117-142)/(57-88) 133/85 (10/07 0530) SpO2:  [94 %-98 %] 97 % (10/07 0530) Weight:  [178 lb 5.6 oz (80.9 kg)-186 lb (84.4 kg)] 178 lb 5.6 oz (80.9 kg) (10/06 2337) Last BM  Date: 06/28/17  General:  Alert, well-developed, well-nourished, in NAD Head:  Normocephalic and atraumatic. Eyes:  Sclera clear, no icterus. Conjunctiva pink. Ears:  Normal auditory acuity. Nose:  No deformity, discharge, or lesions. Mouth:  No deformity or lesions. Oropharynx pink & moist. Neck:  Supple; no masses or thyromegaly. Chest:  Clear throughout to auscultation. No wheezes, crackles, or rhonchi. No acute distress. Heart:  Regular rate and rhythm; no murmurs, clicks, rubs, or gallops. Abdomen:  Soft, nontender and nondistended. No masses, hepatosplenomegaly or hernias noted. Normal bowel sounds, without guarding, and without rebound.   Rectal:  No stool, scant blood, no lesion per EDP  Msk:  Symmetrical without gross deformities. Normal posture. Pulses:  Normal pulses noted. Extremities:  Without clubbing or edema. Neurologic:  Alert and  oriented x4;  grossly normal neurologically. Skin:  Intact without significant lesions or rashes. Cervical Nodes:  No significant cervical adenopathy. Psych:  Alert and cooperative. Normal mood and affect.  Intake/Output from  previous day: 10/06 0701 - 10/07 0700 In: 1747.5 [I.V.:747.5; IV Piggyback:1000] Out: -  Intake/Output this shift: No intake/output data recorded.  Lab Results:  Recent Labs  06/28/17 1937 06/29/17 0457  WBC 8.0 7.0  HGB 13.2 12.1*  HCT 38.3* 35.4*  PLT 184 161   BMET  Recent Labs  06/28/17 1937 06/29/17 0457  NA 139 145  K 3.7 3.8  CL 107 113*  CO2 27 26  GLUCOSE 114* 100*  BUN 16 13  CREATININE 0.79 0.71  CALCIUM 9.0 8.7*   LFT  Recent Labs  06/28/17 1937  PROT 6.3*  ALBUMIN 3.5  AST 23  ALT 19  ALKPHOS 69  BILITOT 0.7   PT/INR  Recent Labs  06/28/17 1937  LABPROT 15.8*  INR 1.27    Previous Endoscopies: See HPI   LOS: 1 day   Latiqua Daloia T. Fuller Plan MD 06/29/2017, 10:44 AM [phone] Mon-Fri 8a-5p  [phone] after 5p, weekends, holidays

## 2017-06-29 NOTE — Care Management Note (Signed)
Case Management Note  Patient Details  Name: Francis Shaw MRN: [id number] Date of Birth: 10-01-1940  Subjective/Objective:   GI bleed                 Action/Plan: Discharge Planning: NCM spoke to pt and wife at bedside. Pt was independent pta. No dc needs identified.   PCP Mosie Lukes MD  Expected Discharge Date:                  Expected Discharge Plan:  Home/Self Care  In-House Referral:  NA  Discharge planning Services  CM Consult  Post Acute Care Choice:  NA Choice offered to:  NA  DME Arranged:  N/A DME Agency:  NA  HH Arranged:  NA HH Agency:  NA  Status of Service:  Completed, signed off  If discussed at [place] of Stay Meetings, dates discussed:    Additional Comments:  Erenest Rasher, RN 06/29/2017, 6:19 PM

## 2017-06-29 NOTE — Care Management Obs Status (Signed)
[facility] NOTIFICATION   Patient Details  Name: Francis Shaw MRN: [id number] Date of Birth: 01-12-1941   Medicare Observation Status Notification Given:  Yes    Erenest Rasher, RN 06/29/2017, 6:18 PM

## 2017-06-29 NOTE — Progress Notes (Signed)
Patient seen and examined. Admitted after midnight secondary to BRBPR. Patient has hx of A. Fib on xarelto and also prior colonoscopy demonstrating diverticulosis. Patient hemodynamically stable. Most likely diverticular bleed. Will continue supportive care and follow GI rec's. Follow Hgb trend. Please refer to H&P written by Dr. Alcario Drought on 06/29/17 for further info/details on admission.  Barton Dubois MD [phone]

## 2017-06-30 ENCOUNTER — Telehealth: Payer: Self-pay | Admitting: Cardiology

## 2017-06-30 DIAGNOSIS — I420 Dilated cardiomyopathy: Secondary | ICD-10-CM

## 2017-06-30 DIAGNOSIS — I480 Paroxysmal atrial fibrillation: Secondary | ICD-10-CM

## 2017-06-30 DIAGNOSIS — Z7901 Long term (current) use of anticoagulants: Secondary | ICD-10-CM | POA: Diagnosis not present

## 2017-06-30 DIAGNOSIS — I5022 Chronic systolic (congestive) heart failure: Secondary | ICD-10-CM

## 2017-06-30 DIAGNOSIS — K625 Hemorrhage of anus and rectum: Secondary | ICD-10-CM | POA: Diagnosis not present

## 2017-06-30 DIAGNOSIS — E785 Hyperlipidemia, unspecified: Secondary | ICD-10-CM | POA: Diagnosis not present

## 2017-06-30 DIAGNOSIS — K5731 Diverticulosis of large intestine without perforation or abscess with bleeding: Secondary | ICD-10-CM | POA: Diagnosis not present

## 2017-06-30 DIAGNOSIS — K5791 Diverticulosis of intestine, part unspecified, without perforation or abscess with bleeding: Secondary | ICD-10-CM

## 2017-06-30 DIAGNOSIS — D62 Acute posthemorrhagic anemia: Secondary | ICD-10-CM | POA: Diagnosis not present

## 2017-06-30 DIAGNOSIS — Z86010 Personal history of colonic polyps: Secondary | ICD-10-CM | POA: Diagnosis not present

## 2017-06-30 LAB — CBC WITH DIFFERENTIAL/PLATELET
BASOS PCT: 0 %
Basophils Absolute: 0 10*3/uL (ref 0.0–0.1)
EOS ABS: 0.2 10*3/uL (ref 0.0–0.7)
Eosinophils Relative: 4 %
HCT: 37.4 % — ABNORMAL LOW (ref 39.0–52.0)
HEMOGLOBIN: 12.7 g/dL — AB (ref 13.0–17.0)
Lymphocytes Relative: 17 %
Lymphs Abs: 1 10*3/uL (ref 0.7–4.0)
MCH: 30.3 pg (ref 26.0–34.0)
MCHC: 34 g/dL (ref 30.0–36.0)
MCV: 89.3 fL (ref 78.0–100.0)
Monocytes Absolute: 0.8 10*3/uL (ref 0.1–1.0)
Monocytes Relative: 13 %
NEUTROS PCT: 66 %
Neutro Abs: 4 10*3/uL (ref 1.7–7.7)
Platelets: 161 10*3/uL (ref 150–400)
RBC: 4.19 MIL/uL — AB (ref 4.22–5.81)
RDW: 12.7 % (ref 11.5–15.5)
WBC: 6 10*3/uL (ref 4.0–10.5)

## 2017-06-30 LAB — BASIC METABOLIC PANEL
Anion gap: 6 (ref 5–15)
BUN: 9 mg/dL (ref 6–20)
CALCIUM: 9.1 mg/dL (ref 8.9–10.3)
CO2: 27 mmol/L (ref 22–32)
CREATININE: 0.74 mg/dL (ref 0.61–1.24)
Chloride: 108 mmol/L (ref 101–111)
GFR calc non Af Amer: >60 mL/min (ref >60)
Glucose, Bld: 107 mg/dL — ABNORMAL HIGH (ref 65–99)
Potassium: 3.8 mmol/L (ref 3.5–5.1)
SODIUM: 141 mmol/L (ref 135–145)

## 2017-06-30 MED ORDER — TRAZODONE HCL 50 MG PO TABS
50.0000 mg | ORAL_TABLET | Freq: Once | ORAL | Status: AC
  Administered 2017-06-30: 50 mg via ORAL
  Filled 2017-06-30: qty 1

## 2017-06-30 MED ORDER — POLYETHYLENE GLYCOL 3350 17 G PO PACK: 17.0000 g | PACK | Freq: Every day | ORAL | 1 refills | Status: DC

## 2017-06-30 MED ORDER — ACETAMINOPHEN 500 MG PO TABS: 1000.0000 mg | ORAL_TABLET | Freq: Three times a day (TID) | ORAL | Status: DC | PRN

## 2017-06-30 MED ORDER — RIVAROXABAN 20 MG PO TABS: 20.0000 mg | ORAL_TABLET | Freq: Every day | ORAL | Status: DC

## 2017-06-30 NOTE — Progress Notes (Signed)
Patient is stable for discharge. Discharge instructions and medications have been reviewed with the patient and all questions answered. AVS and prescriptions given to patient.  

## 2017-06-30 NOTE — Discharge Summary (Signed)
Physician Discharge Summary  Kiyoto Slomski Rainford OAC:[id number] DOB: Dec 03, 1940 DOA: 06/28/2017  PCP: Mosie Lukes, MD  Admit date: 06/28/2017 Discharge date: 06/30/2017  Time spent: 35 minutes  Recommendations for Outpatient Follow-up:  Repeat CBC to follow Hgb trend  Repeat BMET to follow electrolytes and renal function  Patient to resume xarelto in 5 days, also needs to stop use of NSAID's.   Discharge Diagnoses:  Principal Problem:   BRBPR (bright red blood per rectum) Active Problems:   Hyperlipidemia   Congestive dilated cardiomyopathy (HCC)   Nonischemic cardiomyopathy (HCC)   Atrial fibrillation (HCC)   Occult blood in stools   Gastrointestinal hemorrhage associated with intestinal diverticulosis   Chronic systolic HF (heart failure) ([place])   Discharge Condition: stable and improved. Discharge home with instructions to follow up with PCP in 10 days.  Diet recommendation: heart healthy/low sodium diet   Filed Weights   06/28/17 1852 06/28/17 2337  Weight: 84.4 kg (186 lb) 80.9 kg (178 lb 5.6 oz)    History of present illness:  76 y.o. male with medical history significant of A.Fib on Xarelto, NICM with EF 40-45%, HTN.  Patient presented to the ED at [facility] with c/o BRBPR for past 24 hours.  4 episodes total.  First was dark, then turned bright red, last one was dilute.  No further episodes since then.  No abd pain.  Does have history of diverticulosis on colonoscopy last done last year. Patient denies CP, SOB, abd pain, nauesa, vomiting or any other complaints.   Hospital Course:  1-BRBPR -secondary to diverticulosis -self limited  -Hgb remains stable; 12.7 at discharge -patient hemodynamically stable and asymptomatic -per GI rec's: hold xarelto for 5 days and avoid use of NSAID's -patient also informed to maintain adequate hydration and encourage to use stool softener (recently started on trmadol and has noticed hardening of his stools)  2-HTN -stable and well  controlled -will continue current antihypertensive regimen   3-A. Fib -rate controlled -will continue coreg -holding xarelto for 5 days -has upcoming follow up with cardiology service later this week.  4-HLD -continue heart healthy diet  -last LDL 90 -repeat lipid profile as an outpatient   5-chronic systolic HF -compensated -last EF 40-45% -advise to check weight on daily basis and to follow low sodium diet -continue home medication regimen   6-allergic rhinitis -will continue zyrtec    Procedures:  None   Consultations:  GI  Discharge Exam: Vitals:   06/29/17 2026 06/30/17 0513  BP: 131/82 133/62  Pulse: 71 66  Resp: 18 18  Temp: 98.4 F (36.9 C) 98 F (36.7 C)  SpO2: 98% 93%    General: afebrile, no CP, no SOB, no nausea, no vomiting, no  abd pain. Patient w/o further episodes of blood in his stools and tolerating diet. Cardiovascular: rate controlled, no rubs, no gallops, no JVD Respiratory: CTA bilaterally Abd: soft, NT, ND, positive BS Extremities: no edema, no cyanosis, no clubbing   Discharge Instructions   Discharge Instructions    (HEART FAILURE PATIENTS) Call MD:  Anytime you have any of the following symptoms: 1) 3 pound weight gain in 24 hours or 5 pounds in 1 week 2) shortness of breath, with or without a dry hacking cough 3) swelling in the hands, feet or stomach 4) if you have to sleep on extra pillows at night in order to breathe.    Complete by:  As directed    Diet - low sodium heart healthy  Complete by:  As directed    Discharge instructions    Complete by:  As directed    Keep yourself well hydrated  Increase fiber in your diet Start using miralax daily (hold for diarrhea) Tramadol as prescribed only for severe pain Arrange follow up with PCP in 10 days Follow heart healthy diet (less than 2,ooo mg sodium daily)     Current Discharge Medication List    START taking these medications   Details  polyethylene glycol  (MIRALAX) packet Take 17 g by mouth daily. Qty: 30 each, Refills: 1    rivaroxaban (XARELTO) 20 MG TABS tablet Take 1 tablet (20 mg total) by mouth daily with supper.      CONTINUE these medications which have CHANGED   Details  acetaminophen (TYLENOL) 500 MG tablet Take 2 tablets (1,000 mg total) by mouth every 8 (eight) hours as needed for mild pain, moderate pain or headache.      CONTINUE these medications which have NOT CHANGED   Details  carvedilol (COREG) 12.5 MG tablet Take 1.5 tablets (18.75 mg total) by mouth 2 (two) times daily with a meal. Qty: 225 tablet, Refills: 3   Associated Diagnoses: NICM (nonischemic cardiomyopathy) (HCC)    cetirizine (ZYRTEC) 10 MG tablet Take 1 tablet (10 mg total) by mouth daily as needed for allergies. Qty: 30 tablet, Refills: 1   Associated Diagnoses: Cough    clobetasol cream (TEMOVATE) 4.97 % Apply 1 application topically 2 (two) times daily as needed (anal itch). Reported on 11/07/2015 Qty: 30 g, Refills: 0    losartan (COZAAR) 25 MG tablet TAKE 1 TABLET(25 MG) BY MOUTH DAILY Qty: 90 tablet, Refills: 3    Multiple Vitamins-Minerals (CENTRUM SILVER PO) Take 1 tablet by mouth daily.      STOP taking these medications     Naproxen Sodium (ALEVE PO)        No Known Allergies Follow-up Information    Mosie Lukes, MD. Schedule an appointment as soon as possible for a visit in 10 day(s).   Specialty:  Family Medicine Contact information: [address] [place] [address] [phone]           The results of significant diagnostics from this hospitalization (including imaging, microbiology, ancillary and laboratory) are listed below for reference.     Labs: Basic Metabolic Panel:  Recent Labs Lab 06/28/17 1937 06/29/17 0457 06/30/17 0904  NA 139 145 141  K 3.7 3.8 3.8  CL 107 113* 108  CO2 27 26 27   GLUCOSE 114* 100* 107*  BUN 16 13 9   CREATININE 0.79 0.71 0.74  CALCIUM 9.0 8.7* 9.1    Liver Function Tests:  Recent Labs Lab 06/28/17 1937  AST 23  ALT 19  ALKPHOS 69  BILITOT 0.7  PROT 6.3*  ALBUMIN 3.5   CBC:  Recent Labs Lab 06/28/17 1937 06/29/17 0457 06/30/17 0904  WBC 8.0 7.0 6.0  NEUTROABS  --   --  4.0  HGB 13.2 12.1* 12.7*  HCT 38.3* 35.4* 37.4*  MCV 89.5 89.6 89.3  PLT 184 161 161    Signed:  Barton Dubois MD.  [facility] 06/30/2017, 11:45 AM

## 2017-06-30 NOTE — Telephone Encounter (Signed)
Agree with plan as outlined Edwina Grossberg  

## 2017-06-30 NOTE — Progress Notes (Signed)
    Progress Note   Subjective  Chief Complaint: Hematochezia  Pt doing well this morning. 3 small bms overnight, the first with some streaks of brb, the other two brown in color with no further bleeding. Pt tolerating regular diet this morning. Expecting discharge later this afternoon.   Objective   Vital signs in last 24 hours: Temp:  [97.8 F (36.6 C)-98.4 F (36.9 C)] 98 F (36.7 C) (10/08 0513) Pulse Rate:  [66-72] 66 (10/08 0513) Resp:  [18] 18 (10/08 0513) BP: (114-133)/(62-82) 133/62 (10/08 0513) SpO2:  [93 %-98 %] 93 % (10/08 0513) Last BM Date: 06/29/17 General:    Caucasian male in NAD Heart:  Regular rate and rhythm; no murmurs Lungs: Respirations even and unlabored, lungs CTA bilaterally Abdomen:  Soft, nontender and nondistended. Normal bowel sounds. Extremities:  Without edema. Neurologic:  Alert and oriented,  grossly normal neurologically. Psych:  Cooperative. Normal mood and affect.  Intake/Output from previous day: 10/07 0701 - 10/08 0700 In: 640 [P.O.:640] Out: 1200 [Urine:1200]  Lab Results:  Recent Labs  06/28/17 1937 06/29/17 0457  WBC 8.0 7.0  HGB 13.2 12.1*  HCT 38.3* 35.4*  PLT 184 161   BMET  Recent Labs  06/28/17 1937 06/29/17 0457  NA 139 145  K 3.7 3.8  CL 107 113*  CO2 27 26  GLUCOSE 114* 100*  BUN 16 13  CREATININE 0.79 0.71  CALCIUM 9.0 8.7*   LFT  Recent Labs  06/28/17 1937  PROT 6.3*  ALBUMIN 3.5  AST 23  ALT 19  ALKPHOS 69  BILITOT 0.7   PT/INR  Recent Labs  06/28/17 1937  LABPROT 15.8*  INR 1.27    Assessment / Plan:    1. Acute diverticular bleed: Appears to have stopped, pt tolerating regular diet, no plans for repeat colonoscopy. OK for discharge today. No need for GI follow up. 2. ABL anemia: related to above-stable 3. Afib, anticoagulation: per Dr. Fuller Plan, continue to hold Xarelto x5 more days, if no rebleeding can then resume. No ASA/NSAIDs x 2 weeks. 4. Personal h/o adenomatous polyps:  Surveillance colo 04/2019  Discharge Planning Diet:Regular Anticoagulation and antiplatelets:As above Discharge Medications: No new Follow up: None needed  Thank you for your kind consultation, we will sign off.   LOS: 1 day   Levin Erp  06/30/2017, 9:23 AM  Pager # [phone]   Attending physician's note   I have taken an interval history, reviewed the chart and examined the patient. I agree with the Advanced Practitioner's note, impression and recommendations.  Hematochezia could be secondary to diverticular hemorrhage, self resolved or internal hemorrhoids Okay to resume Xarelto in 5 days Hemoglobin stable since admission, did drop 3 units from 15-12 Avoid NSAID's Increase dietary fluid and fiber intake to avoid constipation and avoid excessive straining Benefiber 1 tablespoon 3 times daily with meals MiraLAX 1 capful at bedtime as needed Follow-up in office as needed Okay to discharge home from [place], [place] [phone] Mon-Fri 8a-5p [phone] after 5p, weekends, holidays

## 2017-06-30 NOTE — Telephone Encounter (Signed)
Pt of Dr. Stanford Breed  Was in hospital for rectal bleed, he's still in hospital but is being discharged later today w instruction to hold Xarelto x 5 days before resuming. Wanted to make MD aware - informed pt I'd fwd to Dr. Stanford Breed for chart review and any recommendations.  Recommended to proceed as scheduled for echo and heart monitor on Oct 11th as long as he is doing well. Can f/u w patient w any advice.

## 2017-06-30 NOTE — Telephone Encounter (Signed)
New message    Pt is calling to ask if he needs to come in Thursday for the echo and heart monitor. He said he has been in the hospital over the weekend and wore a heart monitor there. He said they also want him to stop his plavix for a few days.

## 2017-06-30 NOTE — Telephone Encounter (Signed)
Pt advised on Dr. Jacalyn Lefevre review of plan & recommendations, voiced understanding and thanks.

## 2017-07-01 ENCOUNTER — Telehealth: Payer: Self-pay | Admitting: Behavioral Health

## 2017-07-01 NOTE — Telephone Encounter (Signed)
Patient declined TCM/Hospital Follow-up at this time. He stated, "I'm doing pretty good, I had taken a pain reliever that I typically wouldn't have & think it may have caused the GI bleed". Patient has an annual physical appointment scheduled for 07/15/17 with PCP, however he voiced that if anything changes he will call the office sooner to be seen.

## 2017-07-03 ENCOUNTER — Other Ambulatory Visit: Payer: Self-pay

## 2017-07-03 ENCOUNTER — Ambulatory Visit (INDEPENDENT_AMBULATORY_CARE_PROVIDER_SITE_OTHER): Payer: Medicare HMO

## 2017-07-03 ENCOUNTER — Ambulatory Visit (HOSPITAL_COMMUNITY): Payer: Medicare HMO | Attending: Cardiovascular Disease

## 2017-07-03 DIAGNOSIS — I428 Other cardiomyopathies: Secondary | ICD-10-CM | POA: Diagnosis not present

## 2017-07-03 DIAGNOSIS — I5030 Unspecified diastolic (congestive) heart failure: Secondary | ICD-10-CM | POA: Diagnosis not present

## 2017-07-03 DIAGNOSIS — I480 Paroxysmal atrial fibrillation: Secondary | ICD-10-CM | POA: Diagnosis not present

## 2017-07-03 DIAGNOSIS — R9439 Abnormal result of other cardiovascular function study: Secondary | ICD-10-CM | POA: Diagnosis not present

## 2017-07-08 NOTE — Progress Notes (Signed)
Subjective:   Francis Shaw is a 76 y.o. male who presents for Medicare Annual/Subsequent preventive examination.  State he is having hip sx in 13 days with Dr.Rowan. Preop today. Review of Systems:  No ROS.  Medicare Wellness Visit. Additional risk factors are reflected in the social history.  Cardiac Risk Factors include: advanced age (>58men, >65 women);dyslipidemia Sleep patterns: trouble sleeping due to hip pain.   Male:   CCS-   Last 05/21/16: recall 3 yrs   PSA-  Lab Results  Component Value Date   PSA 2.28 11/26/2011       Objective:    Vitals: BP 90/60 (BP Location: Left Arm, Patient Position: Sitting, Cuff Size: Normal)   Pulse 66   Temp 97.7 F (36.5 C) (Oral)   Resp 18   Wt 181 lb 6.4 oz (82.3 kg)   SpO2 96%   BMI 26.03 kg/m   Body mass index is 26.03 kg/m.  Tobacco History  Smoking Status  . Former Smoker  . Packs/day: 1.00  . Years: 18.00  . Types: Cigarettes  . Quit date: 09/24/1979  Smokeless Tobacco  . Never Used     Counseling given: Not Answered   Past Medical History:  Diagnosis Date  . Allergy   . Atrial fibrillation ([place])    a. Eliquis initiated 04/2013.  Marland Kitchen Atrial flutter ([place])   . Dermatitis 11/22/2012  . Esophageal reflux 04/12/2013  . Hypertension   . Nonischemic cardiomyopathy ([place])    a. presumed to be tachy mediated;  b. 03/2013 Echo: EF 30-35%, diff HK, mild LVH, mildly dil LA.  Marland Kitchen Pericarditis   . Plantar wart, right foot 03/26/2017  . Skin lesion 11/26/2011   Past Surgical History:  Procedure Laterality Date  . CALCANEAL OSTEOTOMY Right 1993   "stapled it; screwed it" (04/28/2013)  . CALCANEUS HARDWARE REMOVAL Right ~ 1994  . COLONOSCOPY     10-12 years ago; unable to find records  . 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 LEFT HEART CATHETERIZATION WITH CORONARY ANGIOGRAM N/A 09/30/2013   Procedure: LEFT HEART CATHETERIZATION WITH CORONARY ANGIOGRAM;  Surgeon: Burnell Blanks, MD;  Location: [facility] CATH LAB;   Service: Cardiovascular;  Laterality: N/A;  . TONSILLECTOMY  1940's   Family History  Problem Relation Age of Onset  . Stroke Father   . Asthma Mother        low back pain  . Colon cancer Neg Hx   . Esophageal cancer Neg Hx   . Rectal cancer Neg Hx   . Stomach cancer Neg Hx    History  Sexual Activity  . Sexual activity: Yes  . Partners: Female    Outpatient Encounter Prescriptions as of 07/15/2017  Medication Sig  . acetaminophen (TYLENOL) 500 MG tablet Take 2 tablets (1,000 mg total) by mouth every 8 (eight) hours as needed for mild pain, moderate pain or headache. (Patient taking differently: Take 500 mg by mouth 2 (two) times daily. )  . carvedilol (COREG) 12.5 MG tablet Take 1.5 tablets (18.75 mg total) by mouth 2 (two) times daily with a meal.  . cetirizine (ZYRTEC) 10 MG tablet Take 1 tablet (10 mg total) by mouth daily as needed for allergies.  . clobetasol cream (TEMOVATE) 7.82 % Apply 1 application topically 2 (two) times daily as needed (anal itch). Reported on 11/07/2015  . losartan (COZAAR) 25 MG tablet TAKE 1 TABLET(25 MG) BY MOUTH DAILY  . Multiple Vitamins-Minerals (CENTRUM SILVER PO) Take 1 tablet  by mouth daily.  . polyethylene glycol (MIRALAX) packet Take 17 g by mouth daily.  . rivaroxaban (XARELTO) 20 MG TABS tablet Take 1 tablet (20 mg total) by mouth daily with supper.  . traMADol (ULTRAM) 50 MG tablet Take 1 tablet (50 mg total) by mouth 2 (two) times daily as needed for moderate pain.  . Wheat Dextrin (BENEFIBER PO) Take 1 scoop by mouth daily.  . [DISCONTINUED] traMADol (ULTRAM) 50 MG tablet Take 50 mg by mouth 2 (two) times daily as needed for moderate pain.   No facility-administered encounter medications on file as of 07/15/2017.     Activities of Daily Living In your present state of health, do you have any difficulty performing the following activities: 07/15/2017 06/28/2017  Hearing? N N  Vision? N N  Difficulty concentrating or making decisions?  N N  Walking or climbing stairs? Y Y  Dressing or bathing? N N  Doing errands, shopping? N N  Preparing Food and eating ? N -  Using the Toilet? N -  In the past six months, have you accidently leaked urine? N -  Do you have problems with loss of bowel control? N -  Managing your Medications? N -  Managing your Finances? N -  Housekeeping or managing your Housekeeping? N -  Some recent data might be hidden    Patient Care Team: Mosie Lukes, MD as PCP - General (Family Medicine) Stanford Breed Denice Bors, MD as Consulting Physician (Cardiology)   Assessment:    Physical assessment deferred to PCP.  Exercise Activities and Dietary recommendations Current Exercise Habits: The patient does not participate in regular exercise at present, Exercise limited by: orthopedic condition(s) Diet (meal preparation, eat out, water intake, caffeinated beverages, dairy products, fruits and vegetables): in general, a "healthy" diet      Goals      Patient Stated   . Healthy Lifestyle (pt-stated)          Visit your primary care provider regularly.  Make time for family and friends.  Healthy relationships are important.  Take medications as directed by your provider.  Maintain a healthy weight and a trim waistline.  Eat healthy meals and snacks, rich in fruits, vegetables, whole grains and lean proteins.  Aim for 150 minutes of moderate physical activity each week.  Avoid alcohol or drink in moderation.  Manage stress through prayer, meditation or mindful relaxation.  Get seven to nine hours of quality sleep each night.         Fall Risk Fall Risk  07/15/2017 07/15/2017 07/11/2016 05/01/2015  Falls in the past year? No No No No   Depression Screen PHQ 2/9 Scores 07/15/2017 07/15/2017 07/11/2016 05/01/2015  PHQ - 2 Score 0 0 0 0    Cognitive Function Ad8 score reviewed for issues:  Issues making decisions:no  Less interest in hobbies / activities:no  Repeats questions, stories  (family complaining):no  Trouble using ordinary gadgets (microwave, computer, phone):no  Forgets the month or year: no  Mismanaging finances: no  Remembering appts:no  Daily problems with thinking and/or memory:no Ad8 score is=0     MMSE - Mini Mental State Exam 07/11/2016 05/01/2015  Orientation to time 5 5  Orientation to Place 5 5  Registration 3 3  Attention/ Calculation 5 5  Recall 2 2  Language- name 2 objects 2 2  Language- repeat 1 1  Language- follow 3 step command 3 3  Language- read & follow direction 1 1  Write a sentence  1 1  Copy design 1 1  Total score 29 29        Immunization History  Administered Date(s) Administered  . Pneumococcal Conjugate-13 05/01/2015  . Pneumococcal Polysaccharide-23 03/05/2012  . Td 09/23/2006  . Tdap 03/05/2012  . Zoster 10/24/2013   Screening Tests Health Maintenance  Topic Date Due  . FOOT EXAM  08/08/1951  . OPHTHALMOLOGY EXAM  08/08/1951  . HEMOGLOBIN A1C  10/30/2013  . INFLUENZA VACCINE  04/23/2017  . COLONOSCOPY  05/22/2019  . TETANUS/TDAP  03/05/2022  . PNA vac Low Risk Adult  Completed      Plan:     Follow up with PCP as directed.  Continue to eat heart healthy diet (full of fruits, vegetables, whole grains, lean protein, water--limit salt, fat, and sugar intake) and increase physical activity as tolerated.  Continue doing brain stimulating activities (puzzles, reading, adult coloring books, staying active) to keep memory sharp.     I have personally reviewed and noted the following in the patient's chart:   . Medical and social history . Use of alcohol, tobacco or illicit drugs  . Current medications and supplements . Functional ability and status . Nutritional status . Physical activity . Advanced directives . List of other physicians . Hospitalizations, surgeries, and ER visits in previous 12 months . Vitals . Screenings to include cognitive, depression, and falls . Referrals and  appointments  In addition, I have reviewed and discussed with patient certain preventive protocols, quality metrics, and best practice recommendations. A written personalized care plan for preventive services as well as general preventive health recommendations were provided to patient.     Shela Nevin, [place]  07/15/2017

## 2017-07-14 ENCOUNTER — Inpatient Hospital Stay (HOSPITAL_COMMUNITY): Admission: RE | Admit: 2017-07-14 | Payer: Medicare HMO | Source: Ambulatory Visit

## 2017-07-14 NOTE — Pre-Procedure Instructions (Signed)
Francis Shaw  07/14/2017      [id number] - [place], [place] - [address] [place] [address] Phone: [phone] Fax: [phone]  [facility] [place], [place] - [address] [place] [address] [place] [address] Phone: [phone] Fax: [phone]    Your procedure is scheduled on November 11  Report to [facility] at 0800 A.M.  Call this number if you have problems the morning of surgery:  [phone]   Remember:  Do not eat food or drink liquids after midnight.  Continue all other medications as directed by your physician except follow these medication instructions before surgery   Take these medicines the morning of surgery with A SIP OF WATER  acetaminophen (TYLENOL) carvedilol (COREG) cetirizine (ZYRTEC) traMADol (ULTRAM)  7 days prior to surgery STOP taking any Aspirin (unless otherwise instructed by your surgeon), Aleve, Naproxen, Ibuprofen, Motrin, Advil, Goody's, BC's, all herbal medications, fish oil, and all vitamins  FOLLOW PHYSICIANS INSTRUCTIONS ABOUT XARELTO   Do not wear jewelry  Do not wear lotions, powders, or cologne, or deoderant.  Men may shave face and neck.  Do not bring valuables to the hospital.  [facility] is not responsible for any belongings or valuables.  Contacts, dentures or bridgework may not be worn into surgery.  Leave your suitcase in the car.  After surgery it may be brought to your room.  For patients admitted to the hospital, discharge time will be determined by your treatment team.  Patients discharged the day of surgery will not be allowed to drive home.   Special instructions:   [place]- Preparing For Surgery  Before surgery, you can play an important role. Because skin is not sterile, your skin needs to be as free of germs as possible. You can reduce the  number of germs on your skin by washing with CHG (chlorahexidine gluconate) Soap before surgery.  CHG is an antiseptic cleaner which kills germs and bonds with the skin to continue killing germs even after washing.  Please do not use if you have an allergy to CHG or antibacterial soaps. If your skin becomes reddened/irritated stop using the CHG.  Do not shave (including legs and underarms) for at least 48 hours prior to first CHG shower. It is OK to shave your face.  Please follow these instructions carefully.   1. Shower the NIGHT BEFORE SURGERY and the MORNING OF SURGERY with CHG.   2. If you chose to wash your hair, wash your hair first as usual with your normal shampoo.  3. After you shampoo, rinse your hair and body thoroughly to remove the shampoo.  4. Use CHG as you would any other liquid soap. You can apply CHG directly to the skin and wash gently with a scrungie or a clean washcloth.   5. Apply the CHG Soap to your body ONLY FROM THE NECK DOWN.  Do not use on open wounds or open sores. Avoid contact with your eyes, ears, mouth and genitals (private parts). Wash Face and genitals (private parts)  with your normal soap.  6. Wash thoroughly, paying special attention to the area where your surgery will be performed.  7. Thoroughly rinse your body with warm water from the neck down.  8. DO NOT shower/wash with your normal soap after using and rinsing  off the CHG Soap.  9. Pat yourself dry with a CLEAN TOWEL.  10. Wear CLEAN PAJAMAS to bed the night before surgery, wear comfortable clothes the morning of surgery  11. Place CLEAN SHEETS on your bed the night of your first shower and DO NOT SLEEP WITH PETS.    Day of Surgery: Do not apply any deodorants/lotions. Please wear clean clothes to the hospital/surgery center.      Please read over the following fact sheets that you were given.

## 2017-07-15 ENCOUNTER — Ambulatory Visit: Payer: Medicare HMO | Admitting: *Deleted

## 2017-07-15 ENCOUNTER — Encounter: Payer: Self-pay | Admitting: Family Medicine

## 2017-07-15 ENCOUNTER — Ambulatory Visit (HOSPITAL_COMMUNITY)
Admission: RE | Admit: 2017-07-15 | Discharge: 2017-07-15 | Disposition: A | Discharge status: Home / Self Care | Payer: Medicare HMO | Source: Ambulatory Visit | Attending: Orthopedic Surgery | Admitting: Orthopedic Surgery

## 2017-07-15 ENCOUNTER — Other Ambulatory Visit (HOSPITAL_COMMUNITY): Payer: Medicare HMO

## 2017-07-15 ENCOUNTER — Encounter (HOSPITAL_COMMUNITY)
Admission: RE | Admit: 2017-07-15 | Discharge: 2017-07-15 | Disposition: A | Discharge status: Home / Self Care | Payer: Medicare HMO | Source: Ambulatory Visit | Attending: Orthopedic Surgery | Admitting: Orthopedic Surgery

## 2017-07-15 ENCOUNTER — Ambulatory Visit (INDEPENDENT_AMBULATORY_CARE_PROVIDER_SITE_OTHER): Payer: Medicare HMO | Admitting: Family Medicine

## 2017-07-15 ENCOUNTER — Encounter (HOSPITAL_COMMUNITY): Payer: Self-pay

## 2017-07-15 VITALS — BP 90/60 | HR 66 | Temp 97.7°F | Resp 18 | Wt 181.4 lb

## 2017-07-15 DIAGNOSIS — Z79899 Other long term (current) drug therapy: Secondary | ICD-10-CM | POA: Insufficient documentation

## 2017-07-15 DIAGNOSIS — E1169 Type 2 diabetes mellitus with other specified complication: Secondary | ICD-10-CM | POA: Diagnosis not present

## 2017-07-15 DIAGNOSIS — I4891 Unspecified atrial fibrillation: Secondary | ICD-10-CM

## 2017-07-15 DIAGNOSIS — Z01810 Encounter for preprocedural cardiovascular examination: Secondary | ICD-10-CM | POA: Diagnosis present

## 2017-07-15 DIAGNOSIS — I4892 Unspecified atrial flutter: Secondary | ICD-10-CM | POA: Insufficient documentation

## 2017-07-15 DIAGNOSIS — Z9889 Other specified postprocedural states: Secondary | ICD-10-CM | POA: Diagnosis not present

## 2017-07-15 DIAGNOSIS — Z125 Encounter for screening for malignant neoplasm of prostate: Secondary | ICD-10-CM

## 2017-07-15 DIAGNOSIS — E785 Hyperlipidemia, unspecified: Secondary | ICD-10-CM | POA: Diagnosis not present

## 2017-07-15 DIAGNOSIS — Z87891 Personal history of nicotine dependence: Secondary | ICD-10-CM | POA: Insufficient documentation

## 2017-07-15 DIAGNOSIS — I10 Essential (primary) hypertension: Secondary | ICD-10-CM | POA: Insufficient documentation

## 2017-07-15 DIAGNOSIS — Z89021 Acquired absence of right finger(s): Secondary | ICD-10-CM | POA: Diagnosis not present

## 2017-07-15 DIAGNOSIS — K219 Gastro-esophageal reflux disease without esophagitis: Secondary | ICD-10-CM | POA: Diagnosis not present

## 2017-07-15 DIAGNOSIS — I420 Dilated cardiomyopathy: Secondary | ICD-10-CM | POA: Diagnosis not present

## 2017-07-15 DIAGNOSIS — R918 Other nonspecific abnormal finding of lung field: Secondary | ICD-10-CM | POA: Diagnosis not present

## 2017-07-15 DIAGNOSIS — Z7901 Long term (current) use of anticoagulants: Secondary | ICD-10-CM | POA: Diagnosis not present

## 2017-07-15 DIAGNOSIS — J984 Other disorders of lung: Secondary | ICD-10-CM | POA: Diagnosis not present

## 2017-07-15 DIAGNOSIS — M25551 Pain in right hip: Secondary | ICD-10-CM | POA: Diagnosis not present

## 2017-07-15 DIAGNOSIS — I429 Cardiomyopathy, unspecified: Secondary | ICD-10-CM | POA: Insufficient documentation

## 2017-07-15 DIAGNOSIS — Z01812 Encounter for preprocedural laboratory examination: Secondary | ICD-10-CM | POA: Diagnosis present

## 2017-07-15 DIAGNOSIS — I319 Disease of pericardium, unspecified: Secondary | ICD-10-CM | POA: Insufficient documentation

## 2017-07-15 DIAGNOSIS — M1990 Unspecified osteoarthritis, unspecified site: Secondary | ICD-10-CM | POA: Diagnosis not present

## 2017-07-15 DIAGNOSIS — K5900 Constipation, unspecified: Secondary | ICD-10-CM | POA: Insufficient documentation

## 2017-07-15 DIAGNOSIS — Z0000 Encounter for general adult medical examination without abnormal findings: Secondary | ICD-10-CM | POA: Diagnosis not present

## 2017-07-15 DIAGNOSIS — Z01818 Encounter for other preprocedural examination: Secondary | ICD-10-CM

## 2017-07-15 DIAGNOSIS — I480 Paroxysmal atrial fibrillation: Secondary | ICD-10-CM

## 2017-07-15 LAB — BASIC METABOLIC PANEL
Anion gap: 10 (ref 5–15)
BUN: 14 mg/dL (ref 6–20)
CO2: 24 mmol/L (ref 22–32)
Calcium: 9.6 mg/dL (ref 8.9–10.3)
Chloride: 103 mmol/L (ref 101–111)
Creatinine, Ser: 0.86 mg/dL (ref 0.61–1.24)
GFR calc Af Amer: >60 mL/min (ref >60)
GFR calc non Af Amer: >60 mL/min (ref >60)
GLUCOSE: 104 mg/dL — AB (ref 65–99)
POTASSIUM: 4.5 mmol/L (ref 3.5–5.1)
Sodium: 137 mmol/L (ref 135–145)

## 2017-07-15 LAB — CBC WITH DIFFERENTIAL/PLATELET
Basophils Absolute: 0 10*3/uL (ref 0.0–0.1)
Basophils Relative: 0 %
EOS PCT: 3 %
Eosinophils Absolute: 0.2 10*3/uL (ref 0.0–0.7)
HCT: 42.3 % (ref 39.0–52.0)
Hemoglobin: 14.3 g/dL (ref 13.0–17.0)
LYMPHS ABS: 1.5 10*3/uL (ref 0.7–4.0)
LYMPHS PCT: 19 %
MCH: 30.8 pg (ref 26.0–34.0)
MCHC: 33.8 g/dL (ref 30.0–36.0)
MCV: 91.2 fL (ref 78.0–100.0)
MONO ABS: 1.1 10*3/uL — AB (ref 0.1–1.0)
Monocytes Relative: 14 %
Neutro Abs: 5.1 10*3/uL (ref 1.7–7.7)
Neutrophils Relative %: 64 %
PLATELETS: 212 10*3/uL (ref 150–400)
RBC: 4.64 MIL/uL (ref 4.22–5.81)
RDW: 13.2 % (ref 11.5–15.5)
WBC: 7.9 10*3/uL (ref 4.0–10.5)

## 2017-07-15 LAB — URINALYSIS, ROUTINE W REFLEX MICROSCOPIC
Bacteria, UA: NONE SEEN
Bilirubin Urine: NEGATIVE
GLUCOSE, UA: NEGATIVE mg/dL
Ketones, ur: NEGATIVE mg/dL
Nitrite: NEGATIVE
Protein, ur: NEGATIVE mg/dL
SPECIFIC GRAVITY, URINE: 1.025 (ref 1.005–1.030)
pH: 5 (ref 5.0–8.0)

## 2017-07-15 LAB — TYPE AND SCREEN
ABO/RH(D): O POS
Antibody Screen: NEGATIVE

## 2017-07-15 LAB — ABO/RH: ABO/RH(D): O POS

## 2017-07-15 LAB — SURGICAL PCR SCREEN
MRSA, PCR: NEGATIVE
Staphylococcus aureus: NEGATIVE

## 2017-07-15 MED ORDER — TRAMADOL HCL 50 MG PO TABS: 50.0000 mg | ORAL_TABLET | Freq: Two times a day (BID) | ORAL | 0 refills | Status: DC | PRN

## 2017-07-15 NOTE — Assessment & Plan Note (Signed)
Avoid offending foods, start probiotics. Do not eat large meals in late evening and consider raising head of bed.  

## 2017-07-15 NOTE — Assessment & Plan Note (Signed)
2 days of excessively hard stool preceded his rectal bleed. He is now using Miralax and Benefiber daily with good results, moves bowels comfortably can increase to bid or decrease as needed to achieve comfortable bowels movements every one to two days

## 2017-07-15 NOTE — Assessment & Plan Note (Addendum)
Patient encouraged to maintain heart healthy diet, regular exercise, adequate sleep. Consider daily probiotics. Take medications as prescribed. Labs 0rdered today but did hold off on CBC and CMP they will likely be ordered at his preop visit. He will let us know if they do not order labs and we can add to his blood work on 10/26

## 2017-07-15 NOTE — Progress Notes (Addendum)
PCP - Penni Homans Cardiologist - Kirk Ruths  Chest x-ray - 07/15/17 EKG - 06/28/17 Stress Test - denies ECHO - 2018 Cardiac [id number] -  Sending to anesthesia for review of records Last dose of Xarelto 07/25/17   Patient denies shortness of breath, fever, cough and chest pain at PAT appointment   Patient verbalized understanding of instructions that were given to them at the PAT appointment. Patient was also instructed that they will need to review over the PAT instructions again at home before surgery.

## 2017-07-15 NOTE — Assessment & Plan Note (Signed)
Encouraged heart healthy diet, increase exercise, avoid trans fats, consider a krill oil cap daily 

## 2017-07-15 NOTE — Patient Instructions (Addendum)
64 oz of clear fluids and fiber and exercise, plus more if drink caffeine Miralax and benefiber.  Shingrix is the new shingles shot 2 shots over 2-6 months at pharmacy   Francis Shaw , Thank you for taking time to come for your Medicare Wellness Visit. I appreciate your ongoing commitment to your health goals. Please review the following plan we discussed and let me know if I can assist you in the future.   These are the goals we discussed: Goals      Patient Stated   . Healthy Lifestyle (pt-stated)          Visit your primary care provider regularly.  Make time for family and friends.  Healthy relationships are important.  Take medications as directed by your provider.  Maintain a healthy weight and a trim waistline.  Eat healthy meals and snacks, rich in fruits, vegetables, whole grains and lean proteins.  Aim for 150 minutes of moderate physical activity each week.  Avoid alcohol or drink in moderation.  Manage stress through prayer, meditation or mindful relaxation.  Get seven to nine hours of quality sleep each night.          This is a list of the screening recommended for you and due dates:  Health Maintenance  Topic Date Due  . Complete foot exam   08/08/1951  . Eye exam for diabetics  08/08/1951  . Hemoglobin A1C  10/30/2013  . Flu Shot  04/23/2017  . Colon Cancer Screening  05/22/2019  . Tetanus Vaccine  03/05/2022  . Pneumonia vaccines  Completed     Health Maintenance, Male A healthy lifestyle and preventive care is important for your health and wellness. Ask your health care provider about what schedule of regular examinations is right for you. What should I know about weight and diet? Eat a Healthy Diet  Eat plenty of vegetables, fruits, whole grains, low-fat dairy products, and lean protein.  Do not eat a lot of foods high in solid fats, added sugars, or salt.  Maintain a Healthy Weight Regular exercise can help you achieve or maintain a healthy  weight. You should:  Do at least 150 minutes of exercise each week. The exercise should increase your heart rate and make you sweat (moderate-intensity exercise).  Do strength-training exercises at least twice a week.  Watch Your Levels of Cholesterol and Blood Lipids  Have your blood tested for lipids and cholesterol every 5 years starting at 75 years of age. If you are at high risk for heart disease, you should start having your blood tested when you are 75 years old. You may need to have your cholesterol levels checked more often if: ? Your lipid or cholesterol levels are high. ? You are older than 75 years of age. ? You are at high risk for heart disease.  What should I know about cancer screening? Many types of cancers can be detected early and may often be prevented. Lung Cancer  You should be screened every year for lung cancer if: ? You are a current smoker who has smoked for at least 30 years. ? You are a former smoker who has quit within the past 15 years.  Talk to your health care provider about your screening options, when you should start screening, and how often you should be screened.  Colorectal Cancer  Routine colorectal cancer screening usually begins at 75 years of age and should be repeated every 5-10 years until you are 75 years old. You  may need to be screened more often if early forms of precancerous polyps or small growths are found. Your health care provider may recommend screening at an earlier age if you have risk factors for colon cancer.  Your health care provider may recommend using home test kits to check for hidden blood in the stool.  A small camera at the end of a tube can be used to examine your colon (sigmoidoscopy or colonoscopy). This checks for the earliest forms of colorectal cancer.  Prostate and Testicular Cancer  Depending on your age and overall health, your health care provider may do certain tests to screen for prostate and testicular  cancer.  Talk to your health care provider about any symptoms or concerns you have about testicular or prostate cancer.  Skin Cancer  Check your skin from head to toe regularly.  Tell your health care provider about any new moles or changes in moles, especially if: ? There is a change in a mole's size, shape, or color. ? You have a mole that is larger than a pencil eraser.  Always use sunscreen. Apply sunscreen liberally and repeat throughout the day.  Protect yourself by wearing long sleeves, pants, a wide-brimmed hat, and sunglasses when outside.  What should I know about heart disease, diabetes, and high blood pressure?  If you are 31-82 years of age, have your blood pressure checked every 3-5 years. If you are 80 years of age or older, have your blood pressure checked every year. You should have your blood pressure measured twice-once when you are at a hospital or clinic, and once when you are not at a hospital or clinic. Record the average of the two measurements. To check your blood pressure when you are not at a hospital or clinic, you can use: ? An automated blood pressure machine at a pharmacy. ? A home blood pressure monitor.  Talk to your health care provider about your target blood pressure.  If you are between 41-23 years old, ask your health care provider if you should take aspirin to prevent heart disease.  Have regular diabetes screenings by checking your fasting blood sugar level. ? If you are at a normal weight and have a low risk for diabetes, have this test once every three years after the age of 46. ? If you are overweight and have a high risk for diabetes, consider being tested at a younger age or more often.  A one-time screening for abdominal aortic aneurysm (AAA) by ultrasound is recommended for men aged 50-75 years who are current or former smokers. What should I know about preventing infection? Hepatitis B If you have a higher risk for hepatitis B, you  should be screened for this virus. Talk with your health care provider to find out if you are at risk for hepatitis B infection. Hepatitis C Blood testing is recommended for:  Everyone born from [birth year] through 1964.  Anyone with known risk factors for hepatitis C.  Sexually Transmitted Diseases (STDs)  You should be screened each year for STDs including gonorrhea and chlamydia if: ? You are sexually active and are younger than 75 years of age. ? You are older than 75 years of age and your health care provider tells you that you are at risk for this type of infection. ? Your sexual activity has changed since you were last screened and you are at an increased risk for chlamydia or gonorrhea. Ask your health care provider if you are at risk.  Talk with your health care provider about whether you are at high risk of being infected with HIV. Your health care provider may recommend a prescription medicine to help prevent HIV infection.  What else can I do?  Schedule regular health, dental, and eye exams.  Stay current with your vaccines (immunizations).  Do not use any tobacco products, such as cigarettes, chewing tobacco, and e-cigarettes. If you need help quitting, ask your health care provider.  Limit alcohol intake to no more than 2 drinks per day. One drink equals 12 ounces of beer, 5 ounces of wine, or 1 ounces of hard liquor.  Do not use street drugs.  Do not share needles.  Ask your health care provider for help if you need support or information about quitting drugs.  Tell your health care provider if you often feel depressed.  Tell your health care provider if you have ever been abused or do not feel safe at home. This information is not intended to replace advice given to you by your health care provider. Make sure you discuss any questions you have with your health care provider. Document Released: 03/07/2008 Document Revised: 05/08/2016 Document Reviewed:  06/13/2015 Elsevier Interactive Patient Education  [employer].

## 2017-07-15 NOTE — Progress Notes (Signed)
Patient ID: Francis Shaw, male   DOB: Feb 15, 1941, 76 y.o.   MRN: [id number]   Subjective:    Patient ID: Francis Shaw, male    DOB: Jan 28, 1941, 76 y.o.   MRN: [id number]  Chief Complaint  Patient presents with  . Medicare Wellness    with RN    HPI Patient is in today for  Annual physical exam. He was recently hospitalized with a brisk rectal bleed but is feeling much better now. He acknowledges prior to the bleeding had 2 days of very hard painful stool. He was taken off of Xarelto temporarily and then restarted several days after discharge with no ill effect. He is tolerating it well now and reports overall he feels greatly improved. No recent febrile illness or other hospitalization. 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 Mayer Camel on November 5. He is anxious to proceed to his level of pain. He does still note a lesion on his small toe but does not want to address til after his surgery. Denies CP/palp/SOB/HA/congestion/fevers or GU c/o. Taking meds as prescribed. Is trying to maintain a heart healthy diet but exercise is difficult due to hip pain.   Past Medical History:  Diagnosis Date  . Allergy   . Atrial fibrillation ([place])    a. Eliquis initiated 04/2013.  Marland Kitchen Atrial flutter ([place])   . Dermatitis 11/22/2012  . Esophageal reflux 04/12/2013  . Hypertension   . Nonischemic cardiomyopathy ([place])    a. presumed to be tachy mediated;  b. 03/2013 Echo: EF 30-35%, diff HK, mild LVH, mildly dil LA.  Marland Kitchen Pericarditis   . Plantar wart, right foot 03/26/2017  . Skin lesion 11/26/2011    Past Surgical History:  Procedure Laterality Date  . CALCANEAL OSTEOTOMY Right 1993   "stapled it; screwed it" (04/28/2013)  . CALCANEUS HARDWARE REMOVAL Right ~ 1994  . COLONOSCOPY     10-12 years ago; unable to find records  . 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 LEFT HEART CATHETERIZATION WITH  CORONARY ANGIOGRAM N/A 09/30/2013   Procedure: LEFT HEART CATHETERIZATION WITH CORONARY ANGIOGRAM;  Surgeon: Burnell Blanks, MD;  Location: [facility] CATH LAB;  Service: Cardiovascular;  Laterality: N/A;  . TONSILLECTOMY  1940's    Family History  Problem Relation Age of Onset  . Stroke Father   . Asthma Mother        low back pain  . Colon cancer Neg Hx   . Esophageal cancer Neg Hx   . Rectal cancer Neg Hx   . Stomach cancer Neg Hx     Social History   Social History  . Marital status: Married    Spouse name: N/A  . Number of children: 2  . Years of education: N/A   Occupational History  . self emp    Social History Main Topics  . Smoking status: Former Smoker    Packs/day: 1.00    Years: 18.00    Types: Cigarettes    Quit date: 09/24/1979  . Smokeless tobacco: Never Used  . Alcohol use 0.0 oz/week     Comment: occasionally   . Drug use: No  . Sexual activity: Yes    Partners: Female   Other Topics Concern  . Not on file   Social History Narrative  . No narrative on file    Outpatient Medications Prior to Visit  Medication Sig Dispense  Refill  . acetaminophen (TYLENOL) 500 MG tablet Take 2 tablets (1,000 mg total) by mouth every 8 (eight) hours as needed for mild pain, moderate pain or headache. (Patient taking differently: Take 500 mg by mouth 2 (two) times daily. )    . carvedilol (COREG) 12.5 MG tablet Take 1.5 tablets (18.75 mg total) by mouth 2 (two) times daily with a meal. 225 tablet 3  . cetirizine (ZYRTEC) 10 MG tablet Take 1 tablet (10 mg total) by mouth daily as needed for allergies. 30 tablet 1  . clobetasol cream (TEMOVATE) 2.77 % Apply 1 application topically 2 (two) times daily as needed (anal itch). Reported on 11/07/2015 30 g 0  . losartan (COZAAR) 25 MG tablet TAKE 1 TABLET(25 MG) BY MOUTH DAILY 90 tablet 3  . Multiple Vitamins-Minerals (CENTRUM SILVER PO) Take 1 tablet by mouth daily.    . polyethylene glycol (MIRALAX) packet Take 17 g by mouth  daily. 30 each 1  . rivaroxaban (XARELTO) 20 MG TABS tablet Take 1 tablet (20 mg total) by mouth daily with supper.    . Wheat Dextrin (BENEFIBER PO) Take 1 scoop by mouth daily.    . traMADol (ULTRAM) 50 MG tablet Take 50 mg by mouth 2 (two) times daily as needed for moderate pain.     No facility-administered medications prior to visit.     No Known Allergies  Review of Systems  Constitutional: Negative for chills, fever and malaise/fatigue.  HENT: Negative for congestion and hearing loss.   Eyes: Negative for discharge.  Respiratory: Negative for cough, sputum production and shortness of breath.   Cardiovascular: Negative for chest pain, palpitations and leg swelling.  Gastrointestinal: Positive for blood in stool and constipation. Negative for abdominal pain, diarrhea, heartburn, nausea and vomiting.  Genitourinary: Negative for dysuria, frequency, hematuria and urgency.  Musculoskeletal: Positive for joint pain. Negative for back pain, falls and myalgias.  Skin: Negative for rash.  Neurological: Negative for dizziness, sensory change, loss of consciousness, weakness and headaches.  Endo/Heme/Allergies: Negative for environmental allergies. Does not bruise/bleed easily.  Psychiatric/Behavioral: Negative for depression and suicidal ideas. The patient is not nervous/anxious and does not have insomnia.        Objective:    Physical Exam  Constitutional: He is oriented to person, place, and time. He appears well-developed and well-nourished. No distress.  HENT:  Head: Normocephalic and atraumatic.  Eyes: Conjunctivae are normal.  Neck: Neck supple. No thyromegaly present.  Cardiovascular: Normal rate, regular rhythm and normal heart sounds.   No murmur heard. Pulmonary/Chest: Effort normal and breath sounds normal. No respiratory distress. He has no wheezes.  Abdominal: Soft. Bowel sounds are normal. He exhibits no mass. There is no tenderness.  Musculoskeletal: He exhibits no  edema.  Lymphadenopathy:    He has no cervical adenopathy.  Neurological: He is alert and oriented to person, place, and time.  Skin: Skin is warm and dry.  Psychiatric: He has a normal mood and affect. His behavior is normal.    BP 90/60 (BP Location: Left Arm, Patient Position: Sitting, Cuff Size: Normal)   Pulse 66   Temp 97.7 F (36.5 C) (Oral)   Resp 18   Wt 181 lb 6.4 oz (82.3 kg)   SpO2 96%   BMI 26.03 kg/m  Wt Readings from Last 3 Encounters:  07/15/17 181 lb 6.4 oz (82.3 kg)  06/28/17 178 lb 5.6 oz (80.9 kg)  06/18/17 186 lb (84.4 kg)     Lab Results  Component  Value Date   WBC 6.0 06/30/2017   HGB 12.7 (L) 06/30/2017   HCT 37.4 (L) 06/30/2017   PLT 161 06/30/2017   GLUCOSE 107 (H) 06/30/2017   CHOL 161 11/07/2015   TRIG 106.0 11/07/2015   HDL 49.50 11/07/2015   LDLCALC 90 11/07/2015   ALT 19 06/28/2017   AST 23 06/28/2017   NA 141 06/30/2017   K 3.8 06/30/2017   CL 108 06/30/2017   CREATININE 0.74 06/30/2017   BUN 9 06/30/2017   CO2 27 06/30/2017   TSH 1.32 11/07/2015   PSA 2.28 11/26/2011   INR 1.27 06/28/2017   HGBA1C 5.2 04/29/2013    Lab Results  Component Value Date   TSH 1.32 11/07/2015   Lab Results  Component Value Date   WBC 6.0 06/30/2017   HGB 12.7 (L) 06/30/2017   HCT 37.4 (L) 06/30/2017   MCV 89.3 06/30/2017   PLT 161 06/30/2017   Lab Results  Component Value Date   NA 141 06/30/2017   K 3.8 06/30/2017   CO2 27 06/30/2017   GLUCOSE 107 (H) 06/30/2017   BUN 9 06/30/2017   CREATININE 0.74 06/30/2017   BILITOT 0.7 06/28/2017   ALKPHOS 69 06/28/2017   AST 23 06/28/2017   ALT 19 06/28/2017   PROT 6.3 (L) 06/28/2017   ALBUMIN 3.5 06/28/2017   CALCIUM 9.1 06/30/2017   ANIONGAP 6 06/30/2017   GFR 103.63 11/07/2015   Lab Results  Component Value Date   CHOL 161 11/07/2015   Lab Results  Component Value Date   HDL 49.50 11/07/2015   Lab Results  Component Value Date   LDLCALC 90 11/07/2015   Lab Results    Component Value Date   TRIG 106.0 11/07/2015   Lab Results  Component Value Date   CHOLHDL 3 11/07/2015   Lab Results  Component Value Date   HGBA1C 5.2 04/29/2013       Assessment & Plan:   Problem List Items Addressed This Visit    Congestive dilated cardiomyopathy ([place]) (Chronic)    Follows with cardiology and is doing well. No new concerns.      Preventative health care    Patient encouraged to maintain heart healthy diet, regular exercise, adequate sleep. Consider daily probiotics. Take medications as prescribed. Labs 0rdered today but did hold off on CBC and CMP they will likely be ordered at his preop visit. He will let us know if they do not order labs and we can add to his blood work on 10/26      Relevant Orders   PSA   Hyperlipidemia    Encouraged heart healthy diet, increase exercise, avoid trans fats, consider a krill oil cap daily      Right hip pain    Is having a THR on 07/28/17 with Dr Mayer Camel and is eager to proceed due to his level of pain and debility. Goes for his preop work up today      Esophageal reflux    Avoid offending foods, start probiotics. Do not eat large meals in late evening and consider raising head of bed.       Relevant Orders   TSH   Atrial fibrillation ([place])    Doing well, rate controlled. Is tolerating his Xarelto again        Other Visit Diagnoses    Hyperlipidemia associated with type 2 diabetes mellitus ([place])    -  Primary   Relevant Orders   Lipid panel   Encounter for Medicare  annual wellness exam          I have changed Mr. Gougeon's traMADol. I am also having him maintain his Multiple Vitamins-Minerals (CENTRUM SILVER PO), cetirizine, clobetasol cream, losartan, carvedilol, acetaminophen, rivaroxaban, polyethylene glycol, and Wheat Dextrin (BENEFIBER PO).  Meds ordered this encounter  Medications  . traMADol (ULTRAM) 50 MG tablet    Sig: Take 1 tablet (50 mg total) by mouth 2 (two) times daily as needed for moderate  pain.    Dispense:  30 tablet    Refill:  0    Penni Homans, MD

## 2017-07-15 NOTE — Assessment & Plan Note (Addendum)
Doing well, rate controlled. Is tolerating his Xarelto again

## 2017-07-15 NOTE — Assessment & Plan Note (Signed)
Is having a THR on 07/28/17 with Dr Mayer Camel and is eager to proceed due to his level of pain and debility. Goes for his preop work up today

## 2017-07-15 NOTE — Assessment & Plan Note (Signed)
Follows with cardiology and is doing well. No new concerns.

## 2017-07-17 NOTE — Progress Notes (Addendum)
Anesthesia Chart Review: Patient is a 76 year old male scheduled for left anterior hip arthroplasty on 07/28/17 (10:00 AM) by Dr. Frederik Pear. Case is posted for spinal anesthesia.   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 GI consulted. Hematocheza felt secondary to diverticulosis. Xarelto held for 5 days.   - PCP is Dr. Penni Homans. Last visit 07/15/17. She is aware of surgery plans. - Cardiologist is Dr. Kirk Ruths. Last visit 06/18/17. He was back in afib. Rate control recommended. Coreg increased. Holter and repeat echo ordered (see below). He felt patient was okay to proceed with hip replacement without further work-up and gave permission to hold Xarelto for 3 days prior to surgery and resume when okay with surgeon.    Meds include Coreg, Zyrtec, losartan, Xarelto 20 mg daily with supper, tramadol.   BP 116/60   Pulse 94   Temp (!) 36.4 C   Resp 18   Ht 5\' 10"  (1.778 m)   Wt 182 lb 9.6 oz (82.8 kg)   SpO2 98%   BMI 26.20 kg/m   EKG 06/28/17: Afib at 102 bpm, left BBB. HR 94 bpm with vitals.   Echo 07/03/17: Study Conclusions - Left ventricle: The cavity size was normal. Systolic function was   mildly reduced. The estimated ejection fraction was in the range   of 45% to 50%. Wall motion was normal; there were no regional   wall motion abnormalities. Features are consistent with a   pseudonormal left ventricular filling pattern, with concomitant   abnormal relaxation and increased filling pressure (grade 2   diastolic dysfunction). - Ventricular septum: Septal motion showed abnormal function,   dyssynergy, and paradox. These changes are consistent with   intraventricular conduction delay. - Pulmonary arteries: PA peak pressure: 31 mm Hg (S). (Previous EF 40-45% 11/16/14.)  24 hour Holter  monitor 07/03/17: SR with PVCs.  Cardiac cath 09/30/13: Impression: 1. 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 2. Moderate LV systolic dysfunction. LVEF 35%. Anteroapical hypokinesis. 3. Non-ischemic cardiomyopathy. Recommendations: Continue medical management of cardiomyopathy. Consider adding statin given mild CAD.  Abdominal aortic U/S 11/16/14: IMPRESSION: There is no abdominal aortic aneurysm.  CXR 07/15/17: IMPRESSION: Pleural-parenchymal scarring.  No acute cardiopulmonary disease.  He had normal spirometry on 05/24/15.  Preoperative labs noted. Glucose 104, Cr 0.86, CBC WNL. UA trace leukocytes, negative nitrites, no bacteria seen. He is for PT/PTT on the day of surgery.  Dr. Stanford Breed gave permission to hold Xarelto for 3 days prior to surgery. It will need to be held 72 hours to be considered for spinal. I will follow-up with patient to have him take last dose before surgery 07/24/17 PM. (UPDATE 07/18/17 11:50: Patient notified that Xarelto needs to be held for a full 72 hours to be considered for spinal anesthesia. His last dose will be on 07/24/17.)  George Hugh [facility] Short Stay Center/Anesthesiology Phone [phone] 07/17/2017 7:39 PM

## 2017-07-18 ENCOUNTER — Other Ambulatory Visit (INDEPENDENT_AMBULATORY_CARE_PROVIDER_SITE_OTHER): Payer: Medicare HMO

## 2017-07-18 DIAGNOSIS — E1169 Type 2 diabetes mellitus with other specified complication: Secondary | ICD-10-CM | POA: Diagnosis not present

## 2017-07-18 LAB — LIPID PANEL
CHOLESTEROL: 152 mg/dL (ref 0–200)
HDL: 38.6 mg/dL — AB (ref >39.00)
LDL CALC: 89 mg/dL (ref 0–99)
NonHDL: 113.64
Total CHOL/HDL Ratio: 4
Triglycerides: 123 mg/dL (ref 0.0–149.0)
VLDL: 24.6 mg/dL (ref 0.0–40.0)

## 2017-07-18 LAB — PSA: PSA: 4.1 ng/mL — ABNORMAL HIGH (ref 0.10–4.00)

## 2017-07-18 LAB — TSH: TSH: 1.61 u[IU]/mL (ref 0.35–4.50)

## 2017-07-25 DIAGNOSIS — M1612 Unilateral primary osteoarthritis, left hip: Secondary | ICD-10-CM | POA: Diagnosis present

## 2017-07-25 MED ORDER — LACTATED RINGERS IV SOLN: INTRAVENOUS | Status: DC

## 2017-07-25 MED ORDER — TRANEXAMIC ACID 1000 MG/10ML IV SOLN
2000.0000 mg | INTRAVENOUS | Status: AC
  Administered 2017-07-28: 2000 mg via TOPICAL
  Filled 2017-07-25: qty 20

## 2017-07-25 MED ORDER — CEFAZOLIN SODIUM-DEXTROSE 2-4 GM/100ML-% IV SOLN
2.0000 g | INTRAVENOUS | Status: AC
  Administered 2017-07-28: 2 g via INTRAVENOUS
  Filled 2017-07-25: qty 100

## 2017-07-25 NOTE — H&P (Signed)
TOTAL HIP ADMISSION H&P  Patient is admitted for left total hip arthroplasty.  Subjective:  Chief Complaint: left hip pain  HPI: Francis Shaw, 76 y.o. male, has a history of pain and functional disability in the left hip(s) due to arthritis and patient has failed non-surgical conservative treatments for greater than 12 weeks to include NSAID's and/or analgesics, corticosteriod injections, flexibility and strengthening excercises, weight reduction as appropriate and activity modification.  Onset of symptoms was gradual starting 1 years ago with gradually worsening course since that time.The patient noted no past surgery on the left hip(s).  Patient currently rates pain in the left hip at 8 out of 10 with activity. Patient has night pain, worsening of pain with activity and weight bearing, pain that interfers with activities of daily living and pain with passive range of motion. Patient has evidence of subchondral cysts and joint space narrowing by imaging studies. This condition presents safety issues increasing the risk of falls.    There is no current active infection.  Patient Active Problem List   Diagnosis Date Noted  . Constipation 07/15/2017  . Chronic systolic HF (heart failure) ([place])   . Occult blood in stools 06/28/2017  . BRBPR (bright red blood per rectum) 06/28/2017  . 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 Plantar wart, right foot 03/26/2017  . Allergic rhinitis 12/06/2014  . Bruit 11/02/2014  . Shingles 05/10/2014  . Nonischemic cardiomyopathy ([place])   . Atrial fibrillation ([place])   . Congestive dilated cardiomyopathy ([place]) 04/28/2013  . Esophageal reflux 04/12/2013  . Right hip pain 03/04/2013  . Dermatitis 11/22/2012  . Hyperlipidemia 03/05/2012  . 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 Skin lesion 11/26/2011  . Elevated BP 11/26/2011  . Preventative health care 11/26/2011   Past Medical History:  Diagnosis Date  . Allergy    . Arthritis    left hip  . Atrial fibrillation ([place])    a. Eliquis initiated 04/2013.  Marland Kitchen Atrial flutter ([place])   . Constipation 07/15/2017  . Dermatitis 11/22/2012  . Esophageal reflux 04/12/2013  . Hypertension   . Nonischemic cardiomyopathy ([place])    a. presumed to be tachy mediated;  b. 03/2013 Echo: EF 30-35%, diff HK, mild LVH, mildly dil LA.  Marland Kitchen Pericarditis   . Plantar wart, right foot 03/26/2017  . Skin lesion 11/26/2011    Past Surgical History:  Procedure Laterality Date  . CALCANEAL OSTEOTOMY Right 1993   "stapled it; screwed it" (04/28/2013)  . CALCANEUS HARDWARE REMOVAL Right ~ 1994  . COLONOSCOPY     10-12 years ago; unable to find records  . COLONOSCOPY    . 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 LEFT HEART CATHETERIZATION WITH CORONARY ANGIOGRAM N/A 09/30/2013   Procedure: LEFT HEART CATHETERIZATION WITH CORONARY ANGIOGRAM;  Surgeon: Burnell Blanks, MD;  Location: [facility] CATH LAB;  Service: Cardiovascular;  Laterality: N/A;  . TONSILLECTOMY  1940's    Current Facility-Administered Medications  Medication Dose Route Frequency Provider Last Rate Last Dose  . [START ON 07/28/2017] ceFAZolin (ANCEF) IVPB 2g/100 mL premix  2 g Intravenous To Joellyn Haff, MD      . Derrill Memo ON 07/28/2017] lactated ringers infusion   Intravenous Continuous Frederik Pear, MD      . Derrill Memo ON 07/28/2017] tranexamic acid (CYKLOKAPRON) 2,000 mg in sodium chloride 0.9 % 50 mL Topical Application  8,527 mg Topical To OR Frederik Pear, MD       Current Outpatient Prescriptions  Medication Sig Dispense Refill Last Dose  . acetaminophen (TYLENOL) 500 MG tablet Take 2 tablets (1,000 mg total) by mouth every 8 (eight) hours as needed for mild pain, moderate pain or headache. (Patient taking differently: Take 500 mg by mouth 2 (two) times daily. )   Taking  . carvedilol (COREG) 12.5 MG tablet Take 1.5 tablets (18.75 mg total) by mouth 2 (two) times daily with a meal. 225 tablet 3 Taking  .  cetirizine (ZYRTEC) 10 MG tablet Take 1 tablet (10 mg total) by mouth daily as needed for allergies. 30 tablet 1 Taking  . clobetasol cream (TEMOVATE) 9.48 % Apply 1 application topically 2 (two) times daily as needed (anal itch). Reported on 11/07/2015 30 g 0 Taking  . losartan (COZAAR) 25 MG tablet TAKE 1 TABLET(25 MG) BY MOUTH DAILY 90 tablet 3 Taking  . Multiple Vitamins-Minerals (CENTRUM SILVER PO) Take 1 tablet by mouth daily.   Taking  . polyethylene glycol (MIRALAX) packet Take 17 g by mouth daily. 30 each 1 Taking  . rivaroxaban (XARELTO) 20 MG TABS tablet Take 1 tablet (20 mg total) by mouth daily with supper.   Taking  . Wheat Dextrin (BENEFIBER PO) Take 1 scoop by mouth daily.   Taking  . traMADol (ULTRAM) 50 MG tablet Take 1 tablet (50 mg total) by mouth 2 (two) times daily as needed for moderate pain. 30 tablet 0    No Known Allergies  Social History  Substance Use Topics  . Smoking status: Former Smoker    Packs/day: 1.00    Years: 18.00    Types: Cigarettes    Quit date: 09/24/1979  . Smokeless tobacco: Never Used  . Alcohol use 0.0 oz/week     Comment: occasionally     Family History  Problem Relation Age of Onset  . Stroke Father   . Asthma Mother        low back pain  . Colon cancer Neg Hx   . Esophageal cancer Neg Hx   . Rectal cancer Neg Hx   . Stomach cancer Neg Hx      Review of Systems  Constitutional: Negative.   HENT: Negative.   Eyes: Negative.   Respiratory: Negative.   Cardiovascular: Negative.        Afib  Musculoskeletal: Positive for joint pain and myalgias.  Skin: Negative.   Endo/Heme/Allergies: Bruises/bleeds easily.    Objective:  Physical Exam  Constitutional: He is oriented to person, place, and time. He appears well-developed and well-nourished.  HENT:  Head: Normocephalic and atraumatic.  Eyes: Pupils are equal, round, and reactive to light.  Neck: Normal range of motion. Neck supple.  Cardiovascular: Intact distal pulses.    Respiratory: Effort normal.  Musculoskeletal: He exhibits tenderness.  the patient does have discomfort with internal rotation.  He can internally rotate to approximately 20.  External rotation causes mild pain on the left.  Patient's right hip has no pain with hip flexion extension internal and external rotation or log roll.  Calves are soft and nontender bilaterally.  He is neurovascularly intact distally.  Neurological: He is alert and oriented to person, place, and time.  Skin: Skin is warm and dry.  Psychiatric: He has a normal mood and affect. His behavior is normal. Judgment and thought content normal.    Vital signs in last 24 hours:    Labs:   Estimated body mass index is 26.2 kg/m as calculated from the following:   Height as of 07/15/17: 5\' 10"  (  1.778 m).   Weight as of 07/15/17: 82.8 kg (182 lb 9.6 oz).   Imaging Review  Patient's MRI is reviewed and does show moderately severe osteoarthritis with irregularities of his articular cartilage the left hip.  Patient also has a subchondral cyst and subchondral edema.  Assessment/Plan:  End stage arthritis, left hip(s)  The patient history, physical examination, clinical judgement of the provider and imaging studies are consistent with end stage degenerative joint disease of the left hip(s) and total hip arthroplasty is deemed medically necessary. The treatment options including medical management, injection therapy, arthroscopy and arthroplasty were discussed at length. The risks and benefits of total hip arthroplasty were presented and reviewed. The risks due to aseptic loosening, infection, stiffness, dislocation/subluxation,  thromboembolic complications and other imponderables were discussed.  The patient acknowledged the explanation, agreed to proceed with the plan and consent was signed. Patient is being admitted for inpatient treatment for surgery, pain control, PT, OT, prophylactic antibiotics, VTE prophylaxis, progressive  ambulation and ADL's and discharge planning.The patient is planning to be discharged home with home health services.

## 2017-07-28 ENCOUNTER — Inpatient Hospital Stay (HOSPITAL_COMMUNITY): Payer: Medicare HMO | Admitting: Emergency Medicine

## 2017-07-28 ENCOUNTER — Inpatient Hospital Stay (HOSPITAL_COMMUNITY): Payer: Medicare HMO

## 2017-07-28 ENCOUNTER — Inpatient Hospital Stay (HOSPITAL_COMMUNITY)
Admission: RE | Admit: 2017-07-28 | Discharge: 2017-07-29 | DRG: 470 | Disposition: A | Discharge status: Home Health | Payer: Medicare HMO | Source: Ambulatory Visit | Attending: Orthopedic Surgery | Admitting: Orthopedic Surgery

## 2017-07-28 ENCOUNTER — Inpatient Hospital Stay (HOSPITAL_COMMUNITY): Payer: Medicare HMO | Admitting: Certified Registered Nurse Anesthetist

## 2017-07-28 ENCOUNTER — Encounter (HOSPITAL_COMMUNITY): Payer: Self-pay

## 2017-07-28 ENCOUNTER — Encounter (HOSPITAL_COMMUNITY)
Admission: RE | Disposition: A | Discharge status: Home Health | Payer: Self-pay | Source: Ambulatory Visit | Attending: Orthopedic Surgery

## 2017-07-28 DIAGNOSIS — Z823 Family history of stroke: Secondary | ICD-10-CM | POA: Diagnosis not present

## 2017-07-28 DIAGNOSIS — I5022 Chronic systolic (congestive) heart failure: Secondary | ICD-10-CM | POA: Diagnosis present

## 2017-07-28 DIAGNOSIS — K219 Gastro-esophageal reflux disease without esophagitis: Secondary | ICD-10-CM | POA: Diagnosis present

## 2017-07-28 DIAGNOSIS — Z419 Encounter for procedure for purposes other than remedying health state, unspecified: Secondary | ICD-10-CM

## 2017-07-28 DIAGNOSIS — I420 Dilated cardiomyopathy: Secondary | ICD-10-CM | POA: Diagnosis not present

## 2017-07-28 DIAGNOSIS — M25752 Osteophyte, left hip: Secondary | ICD-10-CM | POA: Diagnosis present

## 2017-07-28 DIAGNOSIS — Z96642 Presence of left artificial hip joint: Secondary | ICD-10-CM | POA: Diagnosis not present

## 2017-07-28 DIAGNOSIS — D62 Acute posthemorrhagic anemia: Secondary | ICD-10-CM | POA: Diagnosis not present

## 2017-07-28 DIAGNOSIS — I4891 Unspecified atrial fibrillation: Secondary | ICD-10-CM | POA: Diagnosis not present

## 2017-07-28 DIAGNOSIS — I110 Hypertensive heart disease with heart failure: Secondary | ICD-10-CM | POA: Diagnosis present

## 2017-07-28 DIAGNOSIS — E785 Hyperlipidemia, unspecified: Secondary | ICD-10-CM | POA: Diagnosis not present

## 2017-07-28 DIAGNOSIS — Z7901 Long term (current) use of anticoagulants: Secondary | ICD-10-CM | POA: Diagnosis not present

## 2017-07-28 DIAGNOSIS — Z79899 Other long term (current) drug therapy: Secondary | ICD-10-CM

## 2017-07-28 DIAGNOSIS — R008 Other abnormalities of heart beat: Secondary | ICD-10-CM | POA: Diagnosis not present

## 2017-07-28 DIAGNOSIS — Z89021 Acquired absence of right finger(s): Secondary | ICD-10-CM

## 2017-07-28 DIAGNOSIS — Z87891 Personal history of nicotine dependence: Secondary | ICD-10-CM | POA: Diagnosis not present

## 2017-07-28 DIAGNOSIS — M1612 Unilateral primary osteoarthritis, left hip: Principal | ICD-10-CM | POA: Diagnosis present

## 2017-07-28 DIAGNOSIS — Z825 Family history of asthma and other chronic lower respiratory diseases: Secondary | ICD-10-CM

## 2017-07-28 DIAGNOSIS — Z79891 Long term (current) use of opiate analgesic: Secondary | ICD-10-CM | POA: Diagnosis not present

## 2017-07-28 HISTORY — PX: TOTAL HIP ARTHROPLASTY: SHX124

## 2017-07-28 LAB — APTT: aPTT: 33 seconds (ref 24–36)

## 2017-07-28 LAB — PROTIME-INR
INR: 1.08
Prothrombin Time: 13.9 seconds (ref 11.4–15.2)

## 2017-07-28 SURGERY — ARTHROPLASTY, HIP, TOTAL, ANTERIOR APPROACH
Anesthesia: Spinal | Laterality: Left

## 2017-07-28 MED ORDER — POLYETHYLENE GLYCOL 3350 17 G PO PACK: 17.0000 g | PACK | Freq: Every day | ORAL | Status: DC | PRN

## 2017-07-28 MED ORDER — ACETAMINOPHEN 325 MG PO TABS
650.0000 mg | ORAL_TABLET | ORAL | Status: DC | PRN
  Administered 2017-07-28 – 2017-07-29 (×2): 650 mg via ORAL
  Filled 2017-07-28 (×2): qty 2

## 2017-07-28 MED ORDER — 0.9 % SODIUM CHLORIDE (POUR BTL) OPTIME
TOPICAL | Status: DC | PRN
  Administered 2017-07-28: 1000 mL

## 2017-07-28 MED ORDER — ALUMINUM HYDROXIDE GEL 320 MG/5ML PO SUSP
15.0000 mL | ORAL | Status: DC | PRN
  Filled 2017-07-28: qty 30

## 2017-07-28 MED ORDER — TRANEXAMIC ACID 1000 MG/10ML IV SOLN
1000.0000 mg | Freq: Once | INTRAVENOUS | Status: AC
  Administered 2017-07-28: 1000 mg via INTRAVENOUS
  Filled 2017-07-28: qty 10

## 2017-07-28 MED ORDER — CARVEDILOL 12.5 MG PO TABS
18.7500 mg | ORAL_TABLET | Freq: Two times a day (BID) | ORAL | Status: DC
  Administered 2017-07-28 – 2017-07-29 (×2): 18.75 mg via ORAL
  Filled 2017-07-28 (×2): qty 1

## 2017-07-28 MED ORDER — PHENYLEPHRINE HCL 10 MG/ML IJ SOLN
INTRAMUSCULAR | Status: DC | PRN
  Administered 2017-07-28: 25 ug/min via INTRAVENOUS

## 2017-07-28 MED ORDER — PHENYLEPHRINE HCL 10 MG/ML IJ SOLN
INTRAMUSCULAR | Status: DC | PRN
  Administered 2017-07-28: 80 ug via INTRAVENOUS

## 2017-07-28 MED ORDER — LACTATED RINGERS IV SOLN
INTRAVENOUS | Status: DC
  Administered 2017-07-28: 08:00:00 via INTRAVENOUS

## 2017-07-28 MED ORDER — LACTATED RINGERS IV SOLN
INTRAVENOUS | Status: DC | PRN
  Administered 2017-07-28 (×2): via INTRAVENOUS

## 2017-07-28 MED ORDER — PROPOFOL 500 MG/50ML IV EMUL
INTRAVENOUS | Status: DC | PRN
  Administered 2017-07-28: 50 ug/kg/min via INTRAVENOUS

## 2017-07-28 MED ORDER — RIVAROXABAN 10 MG PO TABS: 10.0000 mg | ORAL_TABLET | Freq: Every day | ORAL | 0 refills | Status: DC

## 2017-07-28 MED ORDER — GABAPENTIN 300 MG PO CAPS
300.0000 mg | ORAL_CAPSULE | Freq: Three times a day (TID) | ORAL | Status: DC
  Administered 2017-07-28 – 2017-07-29 (×3): 300 mg via ORAL
  Filled 2017-07-28 (×3): qty 1

## 2017-07-28 MED ORDER — OXYCODONE HCL 5 MG PO TABS: 5.0000 mg | ORAL_TABLET | ORAL | Status: DC | PRN

## 2017-07-28 MED ORDER — BUPIVACAINE LIPOSOME 1.3 % IJ SUSP
INTRAMUSCULAR | Status: DC | PRN
  Administered 2017-07-28: 20 mL

## 2017-07-28 MED ORDER — LOSARTAN POTASSIUM 25 MG PO TABS
25.0000 mg | ORAL_TABLET | Freq: Every day | ORAL | Status: DC
  Administered 2017-07-28 – 2017-07-29 (×2): 25 mg via ORAL
  Filled 2017-07-28 (×2): qty 1

## 2017-07-28 MED ORDER — BISACODYL 5 MG PO TBEC: 5.0000 mg | DELAYED_RELEASE_TABLET | Freq: Every day | ORAL | Status: DC | PRN

## 2017-07-28 MED ORDER — MIDAZOLAM HCL 2 MG/2ML IJ SOLN
INTRAMUSCULAR | Status: AC
  Filled 2017-07-28: qty 2

## 2017-07-28 MED ORDER — ONDANSETRON HCL 4 MG/2ML IJ SOLN
INTRAMUSCULAR | Status: DC | PRN
  Administered 2017-07-28: 4 mg via INTRAVENOUS

## 2017-07-28 MED ORDER — MENTHOL 3 MG MT LOZG: 1.0000 | LOZENGE | OROMUCOSAL | Status: DC | PRN

## 2017-07-28 MED ORDER — BUPIVACAINE-EPINEPHRINE (PF) 0.25% -1:200000 IJ SOLN
INTRAMUSCULAR | Status: AC
  Filled 2017-07-28: qty 30

## 2017-07-28 MED ORDER — PROPOFOL 10 MG/ML IV BOLUS
INTRAVENOUS | Status: DC | PRN
  Administered 2017-07-28: 40 mg via INTRAVENOUS

## 2017-07-28 MED ORDER — PHENOL 1.4 % MT LIQD: 1.0000 | OROMUCOSAL | Status: DC | PRN

## 2017-07-28 MED ORDER — FLEET ENEMA 7-19 GM/118ML RE ENEM: 1.0000 | ENEMA | Freq: Once | RECTAL | Status: DC | PRN

## 2017-07-28 MED ORDER — ONDANSETRON HCL 4 MG PO TABS
4.0000 mg | ORAL_TABLET | Freq: Four times a day (QID) | ORAL | Status: DC | PRN
  Administered 2017-07-28: 4 mg via ORAL
  Filled 2017-07-28: qty 1

## 2017-07-28 MED ORDER — OXYCODONE-ACETAMINOPHEN 5-325 MG PO TABS: 1.0000 | ORAL_TABLET | ORAL | 0 refills | Status: DC | PRN

## 2017-07-28 MED ORDER — OXYCODONE HCL 5 MG PO TABS
10.0000 mg | ORAL_TABLET | ORAL | Status: DC | PRN
  Administered 2017-07-28 – 2017-07-29 (×4): 10 mg via ORAL
  Filled 2017-07-28 (×4): qty 2

## 2017-07-28 MED ORDER — DEXAMETHASONE SODIUM PHOSPHATE 10 MG/ML IJ SOLN
10.0000 mg | Freq: Once | INTRAMUSCULAR | Status: AC
  Administered 2017-07-29: 10 mg via INTRAVENOUS
  Filled 2017-07-28: qty 1

## 2017-07-28 MED ORDER — METOCLOPRAMIDE HCL 5 MG PO TABS: 5.0000 mg | ORAL_TABLET | Freq: Three times a day (TID) | ORAL | Status: DC | PRN

## 2017-07-28 MED ORDER — CHLORHEXIDINE GLUCONATE 4 % EX LIQD: 60.0000 mL | Freq: Once | CUTANEOUS | Status: DC

## 2017-07-28 MED ORDER — PROMETHAZINE HCL 25 MG/ML IJ SOLN: 6.2500 mg | INTRAMUSCULAR | Status: DC | PRN

## 2017-07-28 MED ORDER — DIPHENHYDRAMINE HCL 12.5 MG/5ML PO ELIX: 12.5000 mg | ORAL_SOLUTION | ORAL | Status: DC | PRN

## 2017-07-28 MED ORDER — METOCLOPRAMIDE HCL 5 MG/ML IJ SOLN: 5.0000 mg | Freq: Three times a day (TID) | INTRAMUSCULAR | Status: DC | PRN

## 2017-07-28 MED ORDER — TRANEXAMIC ACID 1000 MG/10ML IV SOLN
1000.0000 mg | INTRAVENOUS | Status: AC
  Administered 2017-07-28: 1000 mg via INTRAVENOUS
  Filled 2017-07-28: qty 10

## 2017-07-28 MED ORDER — BUPIVACAINE LIPOSOME 1.3 % IJ SUSP
20.0000 mL | Freq: Once | INTRAMUSCULAR | Status: DC
  Filled 2017-07-28: qty 20

## 2017-07-28 MED ORDER — TIZANIDINE HCL 2 MG PO TABS: 2.0000 mg | ORAL_TABLET | Freq: Four times a day (QID) | ORAL | 0 refills | Status: DC | PRN

## 2017-07-28 MED ORDER — METHOCARBAMOL 500 MG PO TABS
500.0000 mg | ORAL_TABLET | Freq: Four times a day (QID) | ORAL | Status: DC | PRN
Start: 2017-07-28 — End: 2017-07-29
  Administered 2017-07-28 – 2017-07-29 (×2): 500 mg via ORAL
  Filled 2017-07-28 (×2): qty 1

## 2017-07-28 MED ORDER — FENTANYL CITRATE (PF) 250 MCG/5ML IJ SOLN
INTRAMUSCULAR | Status: DC | PRN
  Administered 2017-07-28: 50 ug via INTRAVENOUS

## 2017-07-28 MED ORDER — HYDROMORPHONE HCL 1 MG/ML IJ SOLN: 0.2500 mg | INTRAMUSCULAR | Status: DC | PRN

## 2017-07-28 MED ORDER — MIDAZOLAM HCL 2 MG/2ML IJ SOLN
INTRAMUSCULAR | Status: DC | PRN
  Administered 2017-07-28: 2 mg via INTRAVENOUS

## 2017-07-28 MED ORDER — ACETAMINOPHEN 650 MG RE SUPP: 650.0000 mg | RECTAL | Status: DC | PRN

## 2017-07-28 MED ORDER — BUPIVACAINE-EPINEPHRINE (PF) 0.5% -1:200000 IJ SOLN
INTRAMUSCULAR | Status: DC | PRN
  Administered 2017-07-28: 20 mL via PERINEURAL
  Administered 2017-07-28: 30 mL via PERINEURAL

## 2017-07-28 MED ORDER — ONDANSETRON HCL 4 MG/2ML IJ SOLN
INTRAMUSCULAR | Status: AC
  Filled 2017-07-28: qty 4

## 2017-07-28 MED ORDER — KCL IN DEXTROSE-NACL 20-5-0.45 MEQ/L-%-% IV SOLN
INTRAVENOUS | Status: DC
  Administered 2017-07-28 – 2017-07-29 (×2): via INTRAVENOUS
  Filled 2017-07-28 (×2): qty 1000

## 2017-07-28 MED ORDER — HYDROMORPHONE HCL 1 MG/ML IJ SOLN
0.5000 mg | INTRAMUSCULAR | Status: DC | PRN
  Administered 2017-07-28: 0.5 mg via INTRAVENOUS
  Filled 2017-07-28: qty 1

## 2017-07-28 MED ORDER — RIVAROXABAN 10 MG PO TABS
10.0000 mg | ORAL_TABLET | Freq: Every day | ORAL | Status: DC
  Administered 2017-07-29: 10 mg via ORAL
  Filled 2017-07-28: qty 1

## 2017-07-28 MED ORDER — FENTANYL CITRATE (PF) 250 MCG/5ML IJ SOLN
INTRAMUSCULAR | Status: AC
  Filled 2017-07-28: qty 5

## 2017-07-28 MED ORDER — LORATADINE 10 MG PO TABS
10.0000 mg | ORAL_TABLET | Freq: Every day | ORAL | Status: DC
  Administered 2017-07-28 – 2017-07-29 (×2): 10 mg via ORAL
  Filled 2017-07-28 (×2): qty 1

## 2017-07-28 MED ORDER — METHOCARBAMOL 1000 MG/10ML IJ SOLN
500.0000 mg | Freq: Four times a day (QID) | INTRAVENOUS | Status: DC | PRN
  Filled 2017-07-28: qty 5

## 2017-07-28 MED ORDER — ONDANSETRON HCL 4 MG/2ML IJ SOLN: 4.0000 mg | Freq: Four times a day (QID) | INTRAMUSCULAR | Status: DC | PRN

## 2017-07-28 MED ORDER — DOCUSATE SODIUM 100 MG PO CAPS
100.0000 mg | ORAL_CAPSULE | Freq: Two times a day (BID) | ORAL | Status: DC
  Administered 2017-07-28 – 2017-07-29 (×3): 100 mg via ORAL
  Filled 2017-07-28 (×3): qty 1

## 2017-07-28 SURGICAL SUPPLY — 42 items
BAG DECANTER FOR FLEXI CONT (MISCELLANEOUS) ×2 IMPLANT
BLADE SAW SGTL 18X1.27X75 (BLADE) ×2 IMPLANT
CAPT HIP TOTAL 2 ×2 IMPLANT
COVER PERINEAL POST (MISCELLANEOUS) ×2 IMPLANT
COVER SURGICAL LIGHT HANDLE (MISCELLANEOUS) ×2 IMPLANT
DRAPE C-ARM 42X72 X-RAY (DRAPES) ×2 IMPLANT
DRAPE STERI IOBAN 125X83 (DRAPES) ×2 IMPLANT
DRAPE U-SHAPE 47X51 STRL (DRAPES) ×4 IMPLANT
DRSG AQUACEL AG ADV 3.5X10 (GAUZE/BANDAGES/DRESSINGS) ×2 IMPLANT
DURAPREP 26ML APPLICATOR (WOUND CARE) ×2 IMPLANT
ELECT BLADE 4.0 EZ CLEAN MEGAD (MISCELLANEOUS) ×2
ELECT REM PT RETURN 9FT ADLT (ELECTROSURGICAL) ×2
ELECTRODE BLDE 4.0 EZ CLN MEGD (MISCELLANEOUS) ×1 IMPLANT
ELECTRODE REM PT RTRN 9FT ADLT (ELECTROSURGICAL) ×1 IMPLANT
FACESHIELD WRAPAROUND (MASK) ×6 IMPLANT
GLOVE BIO SURGEON STRL SZ7.5 (GLOVE) ×2 IMPLANT
GLOVE BIO SURGEON STRL SZ8.5 (GLOVE) ×2 IMPLANT
GLOVE BIOGEL PI IND STRL 8 (GLOVE) ×1 IMPLANT
GLOVE BIOGEL PI IND STRL 9 (GLOVE) ×1 IMPLANT
GLOVE BIOGEL PI INDICATOR 8 (GLOVE) ×1
GLOVE BIOGEL PI INDICATOR 9 (GLOVE) ×1
GOWN STRL REUS W/ TWL LRG LVL3 (GOWN DISPOSABLE) ×2 IMPLANT
GOWN STRL REUS W/ TWL XL LVL3 (GOWN DISPOSABLE) ×2 IMPLANT
GOWN STRL REUS W/TWL LRG LVL3 (GOWN DISPOSABLE) ×4
GOWN STRL REUS W/TWL XL LVL3 (GOWN DISPOSABLE) ×4
KIT BASIN OR (CUSTOM PROCEDURE TRAY) ×2 IMPLANT
KIT ROOM TURNOVER OR (KITS) ×2 IMPLANT
MANIFOLD NEPTUNE II (INSTRUMENTS) ×2 IMPLANT
NEEDLE HYPO 22GX1.5 SAFETY (NEEDLE) ×4 IMPLANT
NS IRRIG 1000ML POUR BTL (IV SOLUTION) ×2 IMPLANT
PACK TOTAL JOINT (CUSTOM PROCEDURE TRAY) ×2 IMPLANT
PAD ARMBOARD 7.5X6 YLW CONV (MISCELLANEOUS) ×4 IMPLANT
SUT VIC AB 1 CTX 36 (SUTURE) ×4
SUT VIC AB 1 CTX36XBRD ANBCTR (SUTURE) ×2 IMPLANT
SUT VIC AB 2-0 CT1 27 (SUTURE) ×2
SUT VIC AB 2-0 CT1 TAPERPNT 27 (SUTURE) ×1 IMPLANT
SUT VIC AB 3-0 CT1 27 (SUTURE) ×2
SUT VIC AB 3-0 CT1 TAPERPNT 27 (SUTURE) ×1 IMPLANT
SYR CONTROL 10ML LL (SYRINGE) ×4 IMPLANT
TOWEL OR 17X24 6PK STRL BLUE (TOWEL DISPOSABLE) ×2 IMPLANT
TOWEL OR 17X26 10 PK STRL BLUE (TOWEL DISPOSABLE) ×2 IMPLANT
TRAY CATH 16FR W/PLASTIC CATH (SET/KITS/TRAYS/PACK) ×2 IMPLANT

## 2017-07-28 NOTE — Interval H&P Note (Signed)
History and Physical Interval Note:  07/28/2017 9:05 AM  Francis Shaw  has presented today for surgery, with the diagnosis of LEFT HIP OSTEOARTHRITIS  The various methods of treatment have been discussed with the patient and family. After consideration of risks, benefits and other options for treatment, the patient has consented to  Procedure(s): TOTAL HIP ARTHROPLASTY ANTERIOR APPROACH (Left) as a surgical intervention .  The patient's history has been reviewed, patient examined, no change in status, stable for surgery.  I have reviewed the patient's chart and labs.  Questions were answered to the patient's satisfaction.     Kerin Salen

## 2017-07-28 NOTE — Transfer of Care (Signed)
Immediate Anesthesia Transfer of Care Note  Patient: Francis Shaw  Procedure(s) Performed: TOTAL HIP ARTHROPLASTY ANTERIOR APPROACH (Left )  Patient Location: PACU  Anesthesia Type:Spinal  Level of Consciousness: awake, alert  and oriented  Airway & Oxygen Therapy: Patient Spontanous Breathing  Post-op Assessment: Report given to RN and Post -op Vital signs reviewed and stable  Post vital signs: Reviewed and stable  Last Vitals:  Vitals:   07/28/17 0755  BP: (!) 143/77  Pulse: (!) 59  Resp: 20  Temp: 36.6 C  SpO2: 97%    Last Pain:  Vitals:   07/28/17 0755  TempSrc: Oral  PainSc: 6       Patients Stated Pain Goal: 4 (75/10/25 8527)  Complications: No apparent anesthesia complications

## 2017-07-28 NOTE — Anesthesia Preprocedure Evaluation (Signed)
Anesthesia Evaluation  Patient identified by MRN, date of birth, ID band Patient awake    Reviewed: Allergy & Precautions, NPO status , Patient's Chart, lab work & pertinent test results  Airway Mallampati: II  TM Distance: >3 FB Neck ROM: Full    Dental no notable dental hx.    Pulmonary neg pulmonary ROS, former smoker,    Pulmonary exam normal breath sounds clear to auscultation       Cardiovascular hypertension, +CHF  Normal cardiovascular exam+ dysrhythmias Atrial Fibrillation  Rhythm:Regular Rate:Normal     Neuro/Psych negative neurological ROS  negative psych ROS   GI/Hepatic negative GI ROS, Neg liver ROS,   Endo/Other  negative endocrine ROS  Renal/GU negative Renal ROS  negative genitourinary   Musculoskeletal negative musculoskeletal ROS (+)   Abdominal   Peds negative pediatric ROS (+)  Hematology negative hematology ROS (+)   Anesthesia Other Findings   Reproductive/Obstetrics negative OB ROS                             Anesthesia Physical Anesthesia Plan  ASA: III  Anesthesia Plan: Spinal   Post-op Pain Management:    Induction: Intravenous  PONV Risk Score and Plan: 1 and Treatment may vary due to age or medical condition  Airway Management Planned: Simple Face Mask  Additional Equipment:   Intra-op Plan:   Post-operative Plan:   Informed Consent: I have reviewed the patients History and Physical, chart, labs and discussed the procedure including the risks, benefits and alternatives for the proposed anesthesia with the patient or authorized representative who has indicated his/her understanding and acceptance.   Dental advisory given  Plan Discussed with: CRNA and Surgeon  Anesthesia Plan Comments:         Anesthesia Quick Evaluation

## 2017-07-28 NOTE — Discharge Instructions (Addendum)

## 2017-07-28 NOTE — Evaluation (Signed)
Physical Therapy Evaluation Patient Details Name: Francis Shaw MRN: [id number] DOB: 1940-12-05 Today's Date: 07/28/2017   History of Present Illness  Pt is a 76 y/o male s/p elective L THA, anterior hip precautions. PMH includes HTN, a fib, non-ischemic cardiomyopathy, pericarditis, s/p R calcaneal osteotomy, s/p L heart cath with coronary angiogram.   Clinical Impression  Pt is s/p surgery above with deficits below. PTA, pt was independent with mobility. Upon eval, pt limited by post op pain and weakness, slightly decreased balance, and had episode of nausea/vomiting. Required min guard assist for mobility. Reports his wife will be able to assist as needed and will need DME below. Follow up recommendations per MD arrangements. Will continue to follow acutely to maximize functional mobility independence and safety.     Follow Up Recommendations DC plan and follow up therapy as arranged by surgeon;Supervision for mobility/OOB    Equipment Recommendations  Rolling walker with 5" wheels;3in1 (PT)    Recommendations for Other Services       Precautions / Restrictions Precautions Precautions: Anterior Hip Precaution Booklet Issued: Yes (comment) Precaution Comments: Reviewed hip precautions and supine ther ex with pt.  Restrictions Weight Bearing Restrictions: Yes LLE Weight Bearing: Weight bearing as tolerated      Mobility  Bed Mobility Overal bed mobility: Needs Assistance Bed Mobility: Supine to Sit     Supine to sit: Supervision     General bed mobility comments: Supervision for safety   Transfers Overall transfer level: Needs assistance Equipment used: Rolling walker (2 wheeled) Transfers: Sit to/from Stand Sit to Stand: Min guard         General transfer comment: Min guard for safety. Verbal cues for safe hand placement.   Ambulation/Gait Ambulation/Gait assistance: Min guard Ambulation Distance (Feet): 50 Feet Assistive device: Rolling walker (2  wheeled) Gait Pattern/deviations: Step-through pattern;Decreased step length - left;Decreased step length - right;Decreased weight shift to left;Antalgic Gait velocity: Decreased  Gait velocity interpretation: Below normal speed for age/gender General Gait Details: Slow, slightly antalgic gait. Verbal cues for sequencing with RW. Towards end of gait, pt with nausea and vomiting, so further gait limited.   Stairs            Wheelchair Mobility    Modified Rankin (Stroke Patients Only)       Balance Overall balance assessment: Needs assistance Sitting-balance support: No upper extremity supported;Feet supported Sitting balance-Leahy Scale: Good     Standing balance support: Bilateral upper extremity supported;During functional activity Standing balance-Leahy Scale: Poor Standing balance comment: Reliant on UE support                              Pertinent Vitals/Pain Pain Assessment: 0-10 Pain Score: 5  Pain Location: L hip  Pain Descriptors / Indicators: Aching;Operative site guarding Pain Intervention(s): Limited activity within patient's tolerance;Monitored during session;Repositioned    Home Living Family/patient expects to be discharged to:: Private residence Living Arrangements: Spouse/significant other Available Help at Discharge: Family;Available 24 hours/day Type of Home: House Home Access: Stairs to enter Entrance Stairs-Rails: Right Entrance Stairs-Number of Steps: 3 Home Layout: Two level(office upstairs ) Home Equipment: None      Prior Function Level of Independence: Independent               Hand Dominance   Dominant Hand: Right    Extremity/Trunk Assessment   Upper Extremity Assessment Upper Extremity Assessment: Overall WFL for tasks assessed    Lower  Extremity Assessment Lower Extremity Assessment: LLE deficits/detail LLE Deficits / Details: Numbness in groin area and slightly numb down LLE. Able to perform ther ex  below. Deficits consistent with post op pain and weakness.     Cervical / Trunk Assessment Cervical / Trunk Assessment: Normal  Communication   Communication: No difficulties  Cognition Arousal/Alertness: Awake/alert Behavior During Therapy: WFL for tasks assessed/performed Overall Cognitive Status: Within Functional Limits for tasks assessed                                        General Comments General comments (skin integrity, edema, etc.): Pt's wife present during session     Exercises Total Joint Exercises Ankle Circles/Pumps: AROM;Both;20 reps Quad Sets: AROM;Left;10 reps Short Arc Quad: AROM;Left;10 reps Heel Slides: AROM;Left;10 reps   Assessment/Plan    PT Assessment Patient needs continued PT services  PT Problem List Decreased strength;Decreased balance;Decreased mobility;Decreased activity tolerance;Decreased range of motion;Decreased knowledge of use of DME;Decreased knowledge of precautions;Pain       PT Treatment Interventions DME instruction;Gait training;Stair training;Functional mobility training;Therapeutic activities;Therapeutic exercise;Balance training;Neuromuscular re-education;Patient/family education    PT Goals (Current goals can be found in the Care Plan section)  Acute Rehab PT Goals Patient Stated Goal: to go home  PT Goal Formulation: With patient Time For Goal Achievement: 08/04/17 Potential to Achieve Goals: Good    Frequency 7X/week   Barriers to discharge        Co-evaluation               AM-PAC PT "6 Clicks" Daily Activity  Outcome Measure Difficulty turning over in bed (including adjusting bedclothes, sheets and blankets)?: None Difficulty moving from lying on back to sitting on the side of the bed? : None Difficulty sitting down on and standing up from a chair with arms (e.g., wheelchair, bedside commode, etc,.)?: Unable Help needed moving to and from a bed to chair (including a wheelchair)?: A  Little Help needed walking in hospital room?: A Little Help needed climbing 3-5 steps with a railing? : A Little 6 Click Score: 18    End of Session Equipment Utilized During Treatment: Gait belt Activity Tolerance: Treatment limited secondary to medical complications (Comment)(nausea/vomiting ) Patient left: in chair;with call bell/phone within reach;with family/visitor present Nurse Communication: Mobility status;Other (comment)(nausea/vomiting ) PT Visit Diagnosis: Other abnormalities of gait and mobility (R26.89);Pain Pain - Right/Left: Left Pain - part of body: Hip    Time: 9528-4132 PT Time Calculation (min) (ACUTE ONLY): 34 min   Charges:   PT Evaluation $PT Eval Low Complexity: 1 Low PT Treatments $Gait Training: 8-22 mins   PT G Codes:        Leighton Ruff, PT, DPT  Acute Rehabilitation Services  Pager: [phone]   Rudean Hitt 07/28/2017, 5:39 PM

## 2017-07-28 NOTE — Op Note (Signed)
OPERATIVE REPORT    DATE OF PROCEDURE:  07/28/2017       PREOPERATIVE DIAGNOSIS:  LEFT HIP OSTEOARTHRITIS                                                          POSTOPERATIVE DIAGNOSIS:  LEFT HIP OSTEOARTHRITIS                                                           PROCEDURE: Anterior L total hip arthroplasty using a 50 mm DePuy Pinnacle  Cup, [employer], 0-degree polyethylene liner, a +5 32 mm ceramic head, a 7 hi Depuy Triloc stem   SURGEON: XKGYJ,EHUDJ J    ASSISTANT:   [employer]  (present throughout entire procedure and necessary for timely completion of the procedure)   ANESTHESIA: Spinal BLOOD LOSS: 300cc FLUID REPLACEMENT: 1500cc crystalloid Antibiotic: 2gm Ancef Tranexamic Acid: 1gm IV, 2gm Topical COMPLICATIONS: none    INDICATIONS FOR PROCEDURE: A 76 y.o. year-old With  LEFT HIP OSTEOARTHRITIS   for 3 years, x-rays show bone-on-bone arthritic changes, and osteophytes. Despite conservative measures with observation, anti-inflammatory medicine, narcotics, use of a cane, has severe unremitting pain and can ambulate only a few blocks before resting. Patient desires elective L total hip arthroplasty to decrease pain and increase function. The risks, benefits, and alternatives were discussed at length including but not limited to the risks of infection, bleeding, nerve injury, stiffness, blood clots, the need for revision surgery, cardiopulmonary complications, among others, and they were willing to proceed. Questions answered     PROCEDURE IN DETAIL: The patient was identified by armband,  received preoperative IV antibiotics in the holding area at [facility], taken to the operating room , appropriate anesthetic monitors  were attached and  anesthesia was induced with the patienton the gurney. The HANA boots were applied to the feet and he was then transferred to the HANA table with a peroneal post and support underneath the non-operative le,  which was locked in 5 lb traction. Theoperative lower extremity was then prepped and draped in the usual sterile fashion from just above the iliac crest to the knee. And a timeout procedure was performed. We then made a 12 cm incision along the interval at the leading edge of the tensor fascia lata of starting at 2 cm lateral to and 2 cm distal to the ASIS. Small bleeders in the skin and subcutaneous tissue identified and cauterized we dissected down to the fascia and made an incision in the fascia allowing [place] to elevate the fascia of the tensor muscle and exploited the interval between the rectus and the tensor fascia lata. A Hohmann retractor was then placed along the superior neck of the femur and a Cobra retractor along the inferior neck of the femur we teed the capsule starting out at the superior anterior aspect of the acetabulum going distally and made the T along the neck both leaflets of the T were tagged with #2 Ethibond suture. Cobra retractors were then placed along the inferior and superior neck allowing [place] to perform a standard neck cut and  removed the femoral head with a power corkscrew. We then placed a right angle Hohmann retractor along the anterior aspect of the acetabulum a spiked Cobra in the cotyloid notch and posteriorly a Muelller retractor. We then sequentially reamed up to a 49 mm basket reamer obtaining good coverage in all quadrants, verified by C-arm imaging. Under C-arm control with and hammered into place a 50 mm Pinnacle cup in 45 of abduction and 15 of anteversion. The cup seated nicely and required no supplemental screws. We then placed a central hole Eliminator and a 0 polyethylene liner. The foot was then externally rotated to 110, the HANA elevator was placed around the flare of the greater trochanter and the limb was extended and abducted delivering the proximal femur up into the wound. A medium Hohmann retractor was placed over the greater trochanter and a Mueller retractor  along the posterior femoral neck completing the exposure. We then performed releases superiorly and and inferiorly of the capsule going back to the pirformis fossa superiorly and to the lesser trochanter inferiorly. We then entered the proximal femur with the box cutting offset chisel followed by, a canal sounder, the chili pepper and broaching up to a 7 broach. This seated nicely and we reamed the calcar. A trial reduction was performed with a 5 mm 32 mm head.The limb lengths were excellent the hip was stable in 90 of external rotation. At this point the trial components removed and we hammered into place a # 7 hi  Offset Tri-Lock stem with Gryption coating. A + 5 32 mm ceramic ball was then hammered into place the hip was reduced and final C-arm images obtained. The wound was thoroughly irrigated with normal saline solution. We repaired the ant capsule and the tensor fascia lot a with running 0 vicryl suture. the subcutaneous tissue was closed with 2-0 and 3-0 Vicryl suture followed by an Aquacil dressing. At this point the patient was awaken and transferred to hospital gurney without difficulty. The subcutaneous tissue with 0 and 2-0 undyed Vicryl suture and the skin with running  3-0 vicryl subcuticular suture. Aquacil dressing was applied. The patient was then unclamped, rolled supine, awaken extubated and taken to recovery room without difficulty in stable condition.   Medrith Veillon J 07/28/2017, 11:00 AM

## 2017-07-28 NOTE — Progress Notes (Addendum)
Made Dr. Kalman Shan aware patient's heart monitor reading "Vent. Bigeminy." Heart rate currently 118 and 70 pulse. Patient alert and oriented x4. Denies shortness of breath, dizziness, or any new symptoms. No new orders given by Dr. Kalman Shan.

## 2017-07-28 NOTE — Anesthesia Postprocedure Evaluation (Signed)
Anesthesia Post Note  Patient: Francis Shaw  Procedure(s) Performed: TOTAL HIP ARTHROPLASTY ANTERIOR APPROACH (Left )     Patient location during evaluation: PACU Anesthesia Type: Spinal Level of consciousness: oriented and awake and alert Pain management: pain level controlled Vital Signs Assessment: post-procedure vital signs reviewed and stable Respiratory status: spontaneous breathing, respiratory function stable and patient connected to nasal cannula oxygen Cardiovascular status: blood pressure returned to baseline and stable Postop Assessment: no headache, no backache and no apparent nausea or vomiting Anesthetic complications: no    Last Vitals:  Vitals:   07/28/17 1240 07/28/17 1255  BP: 125/71 123/65  Pulse: 61 67  Resp: 12 (!) 21  Temp:    SpO2: 100% 100%    Last Pain:  Vitals:   07/28/17 1140  TempSrc:   PainSc: 0-No pain                 Melvie Paglia S

## 2017-07-29 ENCOUNTER — Encounter (HOSPITAL_COMMUNITY): Payer: Self-pay | Admitting: Orthopedic Surgery

## 2017-07-29 LAB — BASIC METABOLIC PANEL
Anion gap: 7 (ref 5–15)
BUN: 8 mg/dL (ref 6–20)
CHLORIDE: 105 mmol/L (ref 101–111)
CO2: 25 mmol/L (ref 22–32)
CREATININE: 0.87 mg/dL (ref 0.61–1.24)
Calcium: 8.3 mg/dL — ABNORMAL LOW (ref 8.9–10.3)
GFR calc non Af Amer: >60 mL/min (ref >60)
Glucose, Bld: 114 mg/dL — ABNORMAL HIGH (ref 65–99)
POTASSIUM: 4 mmol/L (ref 3.5–5.1)
SODIUM: 137 mmol/L (ref 135–145)

## 2017-07-29 LAB — CBC
HCT: 34.9 % — ABNORMAL LOW (ref 39.0–52.0)
HEMOGLOBIN: 11.2 g/dL — AB (ref 13.0–17.0)
MCH: 29.5 pg (ref 26.0–34.0)
MCHC: 32.1 g/dL (ref 30.0–36.0)
MCV: 91.8 fL (ref 78.0–100.0)
Platelets: 152 10*3/uL (ref 150–400)
RBC: 3.8 MIL/uL — AB (ref 4.22–5.81)
RDW: 13.1 % (ref 11.5–15.5)
WBC: 11 10*3/uL — AB (ref 4.0–10.5)

## 2017-07-29 NOTE — Plan of Care (Signed)
  Progressing Education: Knowledge of General Education information will improve 07/29/2017 1110 - Progressing by Harlin Heys, RN 07/29/2017 1110 - Progressing by Harlin Heys, [place] Behavior/Discharge Planning: Ability to manage health-related needs will improve 07/29/2017 1110 - Progressing by Harlin Heys, RN 07/29/2017 1110 - Progressing by Harlin Heys, RN Clinical Measurements: Ability to maintain clinical measurements within normal limits will improve 07/29/2017 1110 - Progressing by Harlin Heys, RN 07/29/2017 1110 - Progressing by Harlin Heys, RN Will remain free from infection 07/29/2017 1110 - Progressing by Harlin Heys, RN 07/29/2017 1110 - Progressing by Harlin Heys, RN Diagnostic test results will improve 07/29/2017 1110 - Progressing by Harlin Heys, RN 07/29/2017 1110 - Progressing by Harlin Heys, RN Respiratory complications will improve 07/29/2017 1110 - Progressing by Harlin Heys, RN 07/29/2017 1110 - Progressing by Harlin Heys, RN Cardiovascular complication will be avoided 07/29/2017 1110 - Progressing by Harlin Heys, RN 07/29/2017 1110 - Progressing by Harlin Heys, RN Activity: Risk for activity intolerance will decrease 07/29/2017 1110 - Progressing by Harlin Heys, RN 07/29/2017 1110 - Progressing by Harlin Heys, RN Nutrition: Adequate nutrition will be maintained 07/29/2017 1110 - Progressing by Harlin Heys, RN 07/29/2017 1110 - Progressing by Harlin Heys, RN Coping: Level of anxiety will decrease 07/29/2017 1110 - Progressing by Harlin Heys, RN 07/29/2017 1110 - Progressing by Harlin Heys, RN Elimination: Will not experience complications related to bowel motility 07/29/2017 1110 - Progressing by Harlin Heys, RN 07/29/2017 1110 - Progressing by Harlin Heys, RN Will not experience complications related to urinary retention 07/29/2017 1110 - Progressing by Harlin Heys, RN 07/29/2017  1110 - Progressing by Harlin Heys, RN Pain Managment: General experience of comfort will improve 07/29/2017 1110 - Progressing by Harlin Heys, RN 07/29/2017 1110 - Progressing by Harlin Heys, RN Safety: Ability to remain free from injury will improve 07/29/2017 1110 - Progressing by Harlin Heys, RN 07/29/2017 1110 - Progressing by Harlin Heys, RN Skin Integrity: Risk for impaired skin integrity will decrease 07/29/2017 1110 - Progressing by Harlin Heys, RN 07/29/2017 1110 - Progressing by Harlin Heys, RN

## 2017-07-29 NOTE — Discharge Summary (Signed)
Patient ID: Francis Shaw MRN: [id number] DOB/AGE: 1941/05/10 76 y.o.  Admit date: 07/28/2017 Discharge date: 07/29/2017  Admission Diagnoses:  Principal Problem:   Osteoarthritis of left hip Active Problems:   Primary osteoarthritis of left hip   Discharge Diagnoses:  Same  Past Medical History:  Diagnosis Date  . Allergy   . Arthritis    left hip  . Atrial fibrillation ([place])    a. Eliquis initiated 04/2013.  Marland Kitchen Atrial flutter ([place])   . Constipation 07/15/2017  . Dermatitis 11/22/2012  . Esophageal reflux 04/12/2013  . Hypertension   . Nonischemic cardiomyopathy ([place])    a. presumed to be tachy mediated;  b. 03/2013 Echo: EF 30-35%, diff HK, mild LVH, mildly dil LA.  Marland Kitchen Pericarditis   . Plantar wart, right foot 03/26/2017  . Skin lesion 11/26/2011    Surgeries: Procedure(s): TOTAL HIP ARTHROPLASTY ANTERIOR APPROACH on 07/28/2017   Consultants:   Discharged Condition: Improved  Hospital Course: Francis Shaw is an 76 y.o. male who was admitted 07/28/2017 for operative treatment ofOsteoarthritis of left hip. Patient has severe unremitting pain that affects sleep, daily activities, and work/hobbies. After pre-op clearance the patient was taken to the operating room on 07/28/2017 and underwent  Procedure(s): TOTAL HIP ARTHROPLASTY ANTERIOR APPROACH.    Patient was given perioperative antibiotics:  Anti-infectives (From admission, onward)   Start     Dose/Rate Route Frequency Ordered Stop   07/28/17 0930  ceFAZolin (ANCEF) IVPB 2g/100 mL premix     2 g 200 mL/hr over 30 Minutes Intravenous To [facility]Stay [facility] 07/25/17 0748 07/28/17 1010       Patient was given sequential compression devices, early ambulation, and chemoprophylaxis to prevent DVT.  Patient benefited maximally from hospital stay and there were no complications.    Recent vital signs:  Patient Vitals for the past 24 hrs:  BP Temp Pulse Resp SpO2  07/29/17 0555 131/70 - 83 16 98 %  07/28/17 2235 (!) 112/52  98.2 F (36.8 C) 73 16 98 %  07/28/17 1448 136/79 (!) 97.4 F (36.3 C) 85 17 94 %  07/28/17 1403 131/81 97.7 F (36.5 C) 69 18 97 %  07/28/17 1355 132/82 - 71 18 99 %  07/28/17 1340 (!) 112/49 - 74 (!) 21 95 %  07/28/17 1325 120/88 - (!) 50 18 98 %  07/28/17 1310 117/75 - 66 (!) 24 100 %  07/28/17 1255 123/65 - 67 (!) 21 100 %     Recent laboratory studies:  Recent Labs    07/28/17 0745 07/29/17 0730  WBC  --  11.0*  HGB  --  11.2*  HCT  --  34.9*  PLT  --  152  NA  --  137  K  --  4.0  CL  --  105  CO2  --  25  BUN  --  8  CREATININE  --  0.87  GLUCOSE  --  114*  INR 1.08  --   CALCIUM  --  8.3*     Discharge Medications:   Allergies as of 07/29/2017   No Known Allergies     Medication List    STOP taking these medications   acetaminophen 500 MG tablet Commonly known as:  TYLENOL     TAKE these medications   BENEFIBER PO Take 1 scoop by mouth daily.   carvedilol 12.5 MG tablet Commonly known as:  COREG Take 1.5 tablets (18.75 mg total) by mouth 2 (two) times daily with a  meal.   CENTRUM SILVER PO Take 1 tablet by mouth daily.   cetirizine 10 MG tablet Commonly known as:  ZYRTEC Take 1 tablet (10 mg total) by mouth daily as needed for allergies.   clobetasol cream 0.05 % Commonly known as:  TEMOVATE Apply 1 application topically 2 (two) times daily as needed (anal itch). Reported on 11/07/2015   losartan 25 MG tablet Commonly known as:  COZAAR TAKE 1 TABLET(25 MG) BY MOUTH DAILY   oxyCODONE-acetaminophen 5-325 MG tablet Commonly known as:  ROXICET Take 1 tablet every 4 (four) hours as needed by mouth.   polyethylene glycol packet Commonly known as:  MIRALAX Take 17 g by mouth daily.   rivaroxaban 10 MG Tabs tablet Commonly known as:  XARELTO Take 1 tablet (10 mg total) daily by mouth. What changed:    medication strength  how much to take  when to take this   tiZANidine 2 MG tablet Commonly known as:  ZANAFLEX Take 1 tablet (2 mg  total) every 6 (six) hours as needed by mouth for muscle spasms.   traMADol 50 MG tablet Commonly known as:  ULTRAM Take 1 tablet (50 mg total) by mouth 2 (two) times daily as needed for moderate pain.            Durable Medical Equipment  (From admission, onward)        Start     Ordered   07/28/17 1424  DME Walker rolling  Once    Question:  Patient needs a walker to treat with the following condition  Answer:  Status post total hip replacement, left   07/28/17 1423   07/28/17 1424  DME 3 n 1  Once     07/28/17 1423   07/28/17 1424  DME Bedside commode  Once    Question:  Patient needs a bedside commode to treat with the following condition  Answer:  Status post total hip replacement, left   07/28/17 1423      Diagnostic Studies: Dg Chest 2 View  Result Date: 07/15/2017 CLINICAL DATA:  Preoperative exam . EXAM: CHEST  2 VIEW COMPARISON:  CT 08/23/2015 .  Chest x-ray 08/15/2015. FINDINGS: Mediastinum hilar structures are normal. Heart size stable. Bilateral pleural-parenchymal thickening again noted consistent with scarring. No acute infiltrate. No pleural effusion or pneumothorax. Degenerative change thoracic spine with thoracic spine scoliosis again noted . IMPRESSION: Pleural-parenchymal scarring.  No acute cardiopulmonary disease. Electronically Signed   By: Marcello Moores  Register   On: 07/15/2017 15:40   Dg C-arm 1-60 Min  Result Date: 07/28/2017 CLINICAL DATA:  Status post left hip arthroplasty. EXAM: DG C-ARM 61-120 MIN; OPERATIVE LEFT HIP WITH PELVIS Radiation exposure index:  2.13 mGy. COMPARISON:  None. FINDINGS: Single intraoperative fluoroscopic image of the left hip demonstrates the acetabular and femoral components to be well situated. No fracture or dislocation is noted. Expected postoperative changes are noted in the surrounding soft tissues. IMPRESSION: Status post left total hip arthroplasty. Electronically Signed   By: Marijo Conception, M.D.   On: 07/28/2017 11:21    Dg Hip Operative Unilat W Or W/o Pelvis Left  Result Date: 07/28/2017 CLINICAL DATA:  Status post left hip arthroplasty. EXAM: DG C-ARM 61-120 MIN; OPERATIVE LEFT HIP WITH PELVIS Radiation exposure index:  2.13 mGy. COMPARISON:  None. FINDINGS: Single intraoperative fluoroscopic image of the left hip demonstrates the acetabular and femoral components to be well situated. No fracture or dislocation is noted. Expected postoperative changes are noted in the surrounding  soft tissues. IMPRESSION: Status post left total hip arthroplasty. Electronically Signed   By: Marijo Conception, M.D.   On: 07/28/2017 11:21    Disposition: 01-Home or Self Care  Discharge Instructions    Call MD / Call 911   Complete by:  As directed    If you experience chest pain or shortness of breath, CALL 911 and be transported to the hospital emergency room.  If you develope a fever above 101 F, pus (white drainage) or increased drainage or redness at the wound, or calf pain, call your surgeon's office.   Constipation Prevention   Complete by:  As directed    Drink plenty of fluids.  Prune juice may be helpful.  You may use a stool softener, such as Colace (over the counter) 100 mg twice a day.  Use MiraLax (over the counter) for constipation as needed.   Diet - low sodium heart healthy   Complete by:  As directed    Driving restrictions   Complete by:  As directed    No driving for 2 weeks   Follow the hip precautions as taught in Physical Therapy   Complete by:  As directed    Increase activity slowly as tolerated   Complete by:  As directed    Patient may shower   Complete by:  As directed    You may shower without a dressing once there is no drainage.  Do not wash over the wound.  If drainage remains, cover wound with plastic wrap and then shower.      Follow-up Information    Frederik Pear, MD Follow up in 2 week(s).   Specialty:  Orthopedic Surgery Contact information: [address] [place]  [id number] [phone]        Care, [facility] Follow up.   Specialty:  [place] Why:  A representative from [facility] will contact you to arrane start date and time for your therapy. Contact information: [place] [address] [phone]            Signed: Hardin Negus, Vergil Burby R 07/29/2017, 12:42 PM

## 2017-07-29 NOTE — Progress Notes (Signed)
Physical Therapy Treatment Patient Details Name: Francis Shaw MRN: [id number] DOB: [birth year] Today's Date: 07/29/2017    History of Present Illness Pt is a 76 y/o male s/p elective L THA, anterior hip precautions. PMH includes HTN, a fib, non-ischemic cardiomyopathy, pericarditis, s/p R calcaneal osteotomy, s/p L heart cath with coronary angiogram.     PT Comments    Pt met all requirements to d/c home safely with wife.  He did have questions about lifting 20-40# boxes for work next week.  I generally advised against it and told him to inquire with Dr. Mayer Camel for lifting clearance.  PT to follow acutely until d/c confirmed.      Follow Up Recommendations  DC plan and follow up therapy as arranged by surgeon;Supervision for mobility/OOB     Equipment Recommendations  Rolling walker with 5" wheels;3in1 (PT)    Recommendations for Other Services       Precautions / Restrictions Precautions Precautions: Anterior Hip Precaution Booklet Issued: Yes (comment) Precaution Comments: Reviewed hip precautions with pt Restrictions Weight Bearing Restrictions: Yes LLE Weight Bearing: Weight bearing as tolerated    Mobility  Bed Mobility Overal bed mobility: Needs Assistance Bed Mobility: Supine to Sit;Sit to Supine     Supine to sit: Min guard Sit to supine: Min assist   General bed mobility comments: Min guard assist to help progress left leg to EOB and min assist to help lift it back into bed when returning to supine.   Transfers Overall transfer level: Needs assistance Equipment used: Rolling walker (2 wheeled) Transfers: Sit to/from Stand Sit to Stand: Min guard         General transfer comment: Min guard for safety. Verbal cues for safe hand placement.   Ambulation/Gait Ambulation/Gait assistance: Min guard Ambulation Distance (Feet): 130 Feet Assistive device: Rolling walker (2 wheeled) Gait Pattern/deviations: Step-through pattern;Decreased step length -  left;Decreased step length - right;Decreased weight shift to left;Antalgic Gait velocity: Decreased  Gait velocity interpretation: Below normal speed for age/gender General Gait Details: Mildly antalgic gait pattern improves with increased distance.  Safe use of RW   Stairs Stairs: Yes   Stair Management: One rail Right;Two rails;Step to pattern;Forwards Number of Stairs: 10 General stair comments: verbal cues for safe hand placement and LE sequencing.  Close supervision for safety  Wheelchair Mobility    Modified Rankin (Stroke Patients Only)       Balance Overall balance assessment: Needs assistance Sitting-balance support: No upper extremity supported;Feet supported Sitting balance-Leahy Scale: Good     Standing balance support: Bilateral upper extremity supported;During functional activity Standing balance-Leahy Scale: Fair Standing balance comment: close supervision without UE support                            Cognition Arousal/Alertness: Awake/alert Behavior During Therapy: WFL for tasks assessed/performed Overall Cognitive Status: Within Functional Limits for tasks assessed                                        Exercises Total Joint Exercises Ankle Circles/Pumps: AROM;Both;20 reps Quad Sets: AROM;Left;10 reps Short Arc Quad: AROM;Left;10 reps Heel Slides: AROM;Left;10 reps Long Arc Quad: AROM;Left;10 reps Knee Flexion: AROM;Left;10 reps;Standing Marching in Standing: AROM;Left;10 reps    General Comments        Pertinent Vitals/Pain Pain Assessment: 0-10 Pain Score: 8  Pain Location: L thigh  Pain Descriptors / Indicators: Aching;Operative site guarding Pain Intervention(s): Limited activity within patient's tolerance;Monitored during session;Repositioned;Ice applied    Home Living                      Prior Function            PT Goals (current goals can now be found in the care plan section) Acute Rehab PT  Goals Patient Stated Goal: to go home  Progress towards PT goals: Progressing toward goals    Frequency    7X/week      PT Plan Current plan remains appropriate    Co-evaluation              AM-PAC PT "6 Clicks" Daily Activity  Outcome Measure  Difficulty turning over in bed (including adjusting bedclothes, sheets and blankets)?: None Difficulty moving from lying on back to sitting on the side of the bed? : Unable Difficulty sitting down on and standing up from a chair with arms (e.g., wheelchair, bedside commode, etc,.)?: Unable Help needed moving to and from a bed to chair (including a wheelchair)?: A Little Help needed walking in hospital room?: A Little Help needed climbing 3-5 steps with a railing? : A Little 6 Click Score: 15    End of Session Equipment Utilized During Treatment: Gait belt Activity Tolerance: Patient limited by pain Patient left: in chair;with call bell/phone within reach;with family/visitor present Nurse Communication: Mobility status;Other (comment)(ready for d/c) PT Visit Diagnosis: Other abnormalities of gait and mobility (R26.89);Pain;Difficulty in walking, not elsewhere classified (R26.2) Pain - Right/Left: Left Pain - part of body: Hip     Time: 7185-5015 PT Time Calculation (min) (ACUTE ONLY): 43 min  Charges:  $Gait Training: 23-37 mins $Therapeutic Exercise: 8-22 mins                    Rishik Tubby B. [place], [place], DPT [phone]   07/29/2017, 11:01 AM

## 2017-07-29 NOTE — Care Management Note (Signed)
Case Management Note  Patient Details  Name: Francis Shaw MRN: [id number] Date of Birth: August 07, 1941  Subjective/Objective:    76 yr old gentleman s/p left total hip arthroplasty.                Action/Plan: Case manager spoke with patient concerning discharge plan and DME needs. Patient was preoperatively setup with [facility], no changes. Will have family support at discharge.    Expected Discharge Date:     07/29/17             Expected Discharge Plan:  [place]  In-House Referral:     Discharge planning Services  CM Consult  Post Acute Care Choice:  Home Health Choice offered to:  Patient  DME Arranged:  3-N-1, Walker rolling DME Agency:  [employer]  HH Arranged:  PT HH Agency:  [facility]  Status of Service:  Completed, signed off  If discussed at [employer] of Stay Meetings, dates discussed:    Additional Comments:  Ninfa Meeker, RN 07/29/2017, 12:39 PM

## 2017-07-29 NOTE — Progress Notes (Signed)
PATIENT ID: Francis Shaw  MRN: [id number]  DOB/AGE:  75-Mar-1942 / 76 y.o.  1 Day Post-Op Procedure(s) (LRB): TOTAL HIP ARTHROPLASTY ANTERIOR APPROACH (Left)    PROGRESS NOTE Subjective: Patient is alert, oriented, x1 Nausea, no Vomiting, yes passing gas, . Taking PO well. Denies SOB, Chest or Calf Pain. Using Incentive Spirometer, PAS in place. Ambulate Walk in room and hallway without difficulty Patient reports pain as  4/10.Patient had ventricular bigeminy yesterday, this has resolved.  Cardiology consult it and is aware    Objective: Vital signs in last 24 hours: Vitals:   07/28/17 1403 07/28/17 1448 07/28/17 2235 07/29/17 0555  BP: 131/81 136/79 (!) 112/52 131/70  Pulse: 69 85 73 83  Resp: 18 17 16 16   Temp: 97.7 F (36.5 C) (!) 97.4 F (36.3 C) 98.2 F (36.8 C)   TempSrc:      SpO2: 97% 94% 98% 98%  Weight:          Intake/Output from previous day: I/O last 3 completed shifts: In: 5102 [P.O.:480; I.V.:1200; Other:75] Out: 1150 [Urine:900; Blood:250]   Intake/Output this shift: No intake/output data recorded.   LABORATORY DATA: Recent Labs    07/28/17 0745  INR 1.08    Examination: Neurologically intact ABD soft Neurovascular intact Sensation intact distally Intact pulses distally Dorsiflexion/Plantar flexion intact Incision: dressing C/D/I No cellulitis present Compartment soft} XR AP&Lat of hip shows well placed\fixed THA  Assessment:   1 Day Post-Op Procedure(s) (LRB): TOTAL HIP ARTHROPLASTY ANTERIOR APPROACH (Left) ADDITIONAL DIAGNOSIS:  Expected Acute Blood Loss Anemia, Past history of congestive dilated cardiomyopathy, past history of atrial fibrillation.  Plan: PT/OT WBAT, THA  DVT Prophylaxis: SCDx72 hrs, ASA 325 mg BID x 2 weeks  DISCHARGE PLAN: HomeToday if patient passes physical therapy and has no further arrhythmias.  DISCHARGE NEEDS: HHPT, Walker and 3-in-1 comode seatPatient ID: Francis Shaw, male   DOB: [birth year], 76 y.o.   MRN:  [id number]

## 2017-07-29 NOTE — Progress Notes (Signed)
Pt discharged home with wife. AVS and scripts reviewed in full and all questions answered. All belongings sent with patient including DME. VSS. BP 122/76 (BP Location: Left Arm)   Pulse 80   Temp 98.1 F (36.7 C) (Oral)   Resp 15   Wt 82.8 kg (182 lb 9.6 oz)   SpO2 96%   BMI 26.20 kg/m

## 2017-07-30 DIAGNOSIS — I110 Hypertensive heart disease with heart failure: Secondary | ICD-10-CM | POA: Diagnosis not present

## 2017-07-30 DIAGNOSIS — Z471 Aftercare following joint replacement surgery: Secondary | ICD-10-CM | POA: Diagnosis not present

## 2017-07-30 DIAGNOSIS — I4891 Unspecified atrial fibrillation: Secondary | ICD-10-CM | POA: Diagnosis not present

## 2017-07-30 DIAGNOSIS — K5900 Constipation, unspecified: Secondary | ICD-10-CM | POA: Diagnosis not present

## 2017-07-30 DIAGNOSIS — Z87891 Personal history of nicotine dependence: Secondary | ICD-10-CM | POA: Diagnosis not present

## 2017-07-30 DIAGNOSIS — I5022 Chronic systolic (congestive) heart failure: Secondary | ICD-10-CM | POA: Diagnosis not present

## 2017-07-30 DIAGNOSIS — Z96642 Presence of left artificial hip joint: Secondary | ICD-10-CM | POA: Diagnosis not present

## 2017-08-01 DIAGNOSIS — Z471 Aftercare following joint replacement surgery: Secondary | ICD-10-CM | POA: Diagnosis not present

## 2017-08-01 DIAGNOSIS — I5022 Chronic systolic (congestive) heart failure: Secondary | ICD-10-CM | POA: Diagnosis not present

## 2017-08-01 DIAGNOSIS — Z87891 Personal history of nicotine dependence: Secondary | ICD-10-CM | POA: Diagnosis not present

## 2017-08-01 DIAGNOSIS — I4891 Unspecified atrial fibrillation: Secondary | ICD-10-CM | POA: Diagnosis not present

## 2017-08-01 DIAGNOSIS — K5900 Constipation, unspecified: Secondary | ICD-10-CM | POA: Diagnosis not present

## 2017-08-01 DIAGNOSIS — I110 Hypertensive heart disease with heart failure: Secondary | ICD-10-CM | POA: Diagnosis not present

## 2017-08-01 DIAGNOSIS — Z96642 Presence of left artificial hip joint: Secondary | ICD-10-CM | POA: Diagnosis not present

## 2017-08-03 ENCOUNTER — Encounter: Payer: Self-pay | Admitting: Family Medicine

## 2017-08-04 DIAGNOSIS — Z96642 Presence of left artificial hip joint: Secondary | ICD-10-CM | POA: Diagnosis not present

## 2017-08-04 DIAGNOSIS — Z471 Aftercare following joint replacement surgery: Secondary | ICD-10-CM | POA: Diagnosis not present

## 2017-08-04 DIAGNOSIS — Z87891 Personal history of nicotine dependence: Secondary | ICD-10-CM | POA: Diagnosis not present

## 2017-08-04 DIAGNOSIS — I4891 Unspecified atrial fibrillation: Secondary | ICD-10-CM | POA: Diagnosis not present

## 2017-08-04 DIAGNOSIS — I110 Hypertensive heart disease with heart failure: Secondary | ICD-10-CM | POA: Diagnosis not present

## 2017-08-04 DIAGNOSIS — I5022 Chronic systolic (congestive) heart failure: Secondary | ICD-10-CM | POA: Diagnosis not present

## 2017-08-04 DIAGNOSIS — K5900 Constipation, unspecified: Secondary | ICD-10-CM | POA: Diagnosis not present

## 2017-08-04 NOTE — Telephone Encounter (Signed)
Yes a urinalysis and culture could tell us if he has an infection. The best thing to do is to get a urine sample but he could send someone in to get a sample cup if that is a possibility. That is the best thing to do so we know what we are treating. If that is not possible I am willing to rx Amoxicillin 500 mg po tid x 5 days then if no better needs to be seen.

## 2017-08-06 DIAGNOSIS — K5900 Constipation, unspecified: Secondary | ICD-10-CM | POA: Diagnosis not present

## 2017-08-06 DIAGNOSIS — Z471 Aftercare following joint replacement surgery: Secondary | ICD-10-CM | POA: Diagnosis not present

## 2017-08-06 DIAGNOSIS — I4891 Unspecified atrial fibrillation: Secondary | ICD-10-CM | POA: Diagnosis not present

## 2017-08-06 DIAGNOSIS — I5022 Chronic systolic (congestive) heart failure: Secondary | ICD-10-CM | POA: Diagnosis not present

## 2017-08-06 DIAGNOSIS — Z96642 Presence of left artificial hip joint: Secondary | ICD-10-CM | POA: Diagnosis not present

## 2017-08-06 DIAGNOSIS — I110 Hypertensive heart disease with heart failure: Secondary | ICD-10-CM | POA: Diagnosis not present

## 2017-08-06 DIAGNOSIS — Z87891 Personal history of nicotine dependence: Secondary | ICD-10-CM | POA: Diagnosis not present

## 2017-08-08 DIAGNOSIS — I110 Hypertensive heart disease with heart failure: Secondary | ICD-10-CM | POA: Diagnosis not present

## 2017-08-08 DIAGNOSIS — I5022 Chronic systolic (congestive) heart failure: Secondary | ICD-10-CM | POA: Diagnosis not present

## 2017-08-08 DIAGNOSIS — K5900 Constipation, unspecified: Secondary | ICD-10-CM | POA: Diagnosis not present

## 2017-08-08 DIAGNOSIS — I4891 Unspecified atrial fibrillation: Secondary | ICD-10-CM | POA: Diagnosis not present

## 2017-08-08 DIAGNOSIS — Z87891 Personal history of nicotine dependence: Secondary | ICD-10-CM | POA: Diagnosis not present

## 2017-08-08 DIAGNOSIS — Z96642 Presence of left artificial hip joint: Secondary | ICD-10-CM | POA: Diagnosis not present

## 2017-08-08 DIAGNOSIS — Z471 Aftercare following joint replacement surgery: Secondary | ICD-10-CM | POA: Diagnosis not present

## 2017-08-11 DIAGNOSIS — I5022 Chronic systolic (congestive) heart failure: Secondary | ICD-10-CM | POA: Diagnosis not present

## 2017-08-11 DIAGNOSIS — I4891 Unspecified atrial fibrillation: Secondary | ICD-10-CM | POA: Diagnosis not present

## 2017-08-11 DIAGNOSIS — Z87891 Personal history of nicotine dependence: Secondary | ICD-10-CM | POA: Diagnosis not present

## 2017-08-11 DIAGNOSIS — K5900 Constipation, unspecified: Secondary | ICD-10-CM | POA: Diagnosis not present

## 2017-08-11 DIAGNOSIS — Z471 Aftercare following joint replacement surgery: Secondary | ICD-10-CM | POA: Diagnosis not present

## 2017-08-11 DIAGNOSIS — I110 Hypertensive heart disease with heart failure: Secondary | ICD-10-CM | POA: Diagnosis not present

## 2017-08-11 DIAGNOSIS — Z96642 Presence of left artificial hip joint: Secondary | ICD-10-CM | POA: Diagnosis not present

## 2017-08-12 DIAGNOSIS — Z96642 Presence of left artificial hip joint: Secondary | ICD-10-CM | POA: Diagnosis not present

## 2017-08-12 DIAGNOSIS — M1612 Unilateral primary osteoarthritis, left hip: Secondary | ICD-10-CM | POA: Diagnosis not present

## 2017-08-12 DIAGNOSIS — Z471 Aftercare following joint replacement surgery: Secondary | ICD-10-CM | POA: Diagnosis not present

## 2017-08-21 DIAGNOSIS — Z471 Aftercare following joint replacement surgery: Secondary | ICD-10-CM | POA: Diagnosis not present

## 2017-08-21 DIAGNOSIS — Z96642 Presence of left artificial hip joint: Secondary | ICD-10-CM | POA: Diagnosis not present

## 2017-08-21 DIAGNOSIS — M25552 Pain in left hip: Secondary | ICD-10-CM | POA: Diagnosis not present

## 2017-08-26 DIAGNOSIS — M25552 Pain in left hip: Secondary | ICD-10-CM | POA: Diagnosis not present

## 2017-08-26 DIAGNOSIS — Z471 Aftercare following joint replacement surgery: Secondary | ICD-10-CM | POA: Diagnosis not present

## 2017-08-26 DIAGNOSIS — Z96642 Presence of left artificial hip joint: Secondary | ICD-10-CM | POA: Diagnosis not present

## 2017-08-28 DIAGNOSIS — Z471 Aftercare following joint replacement surgery: Secondary | ICD-10-CM | POA: Diagnosis not present

## 2017-08-28 DIAGNOSIS — M25552 Pain in left hip: Secondary | ICD-10-CM | POA: Diagnosis not present

## 2017-08-28 DIAGNOSIS — Z96642 Presence of left artificial hip joint: Secondary | ICD-10-CM | POA: Diagnosis not present

## 2017-09-02 DIAGNOSIS — Z471 Aftercare following joint replacement surgery: Secondary | ICD-10-CM | POA: Diagnosis not present

## 2017-09-02 DIAGNOSIS — M25552 Pain in left hip: Secondary | ICD-10-CM | POA: Diagnosis not present

## 2017-09-02 DIAGNOSIS — Z96642 Presence of left artificial hip joint: Secondary | ICD-10-CM | POA: Diagnosis not present

## 2017-09-04 DIAGNOSIS — Z96642 Presence of left artificial hip joint: Secondary | ICD-10-CM | POA: Diagnosis not present

## 2017-09-04 DIAGNOSIS — Z471 Aftercare following joint replacement surgery: Secondary | ICD-10-CM | POA: Diagnosis not present

## 2017-09-04 DIAGNOSIS — M25552 Pain in left hip: Secondary | ICD-10-CM | POA: Diagnosis not present

## 2017-09-09 DIAGNOSIS — Z96642 Presence of left artificial hip joint: Secondary | ICD-10-CM | POA: Diagnosis not present

## 2017-09-09 DIAGNOSIS — Z471 Aftercare following joint replacement surgery: Secondary | ICD-10-CM | POA: Diagnosis not present

## 2017-09-09 DIAGNOSIS — M25552 Pain in left hip: Secondary | ICD-10-CM | POA: Diagnosis not present

## 2017-09-11 DIAGNOSIS — Z471 Aftercare following joint replacement surgery: Secondary | ICD-10-CM | POA: Diagnosis not present

## 2017-09-11 DIAGNOSIS — Z96642 Presence of left artificial hip joint: Secondary | ICD-10-CM | POA: Diagnosis not present

## 2017-09-11 DIAGNOSIS — M25552 Pain in left hip: Secondary | ICD-10-CM | POA: Diagnosis not present

## 2017-09-20 ENCOUNTER — Encounter: Payer: Self-pay | Admitting: Family Medicine

## 2017-09-22 ENCOUNTER — Encounter: Payer: Self-pay | Admitting: Family Medicine

## 2017-09-25 ENCOUNTER — Telehealth: Payer: Self-pay

## 2017-09-25 ENCOUNTER — Other Ambulatory Visit: Payer: Self-pay | Admitting: Family Medicine

## 2017-09-25 DIAGNOSIS — Z471 Aftercare following joint replacement surgery: Secondary | ICD-10-CM | POA: Diagnosis not present

## 2017-09-25 DIAGNOSIS — Z96642 Presence of left artificial hip joint: Secondary | ICD-10-CM | POA: Diagnosis not present

## 2017-09-25 DIAGNOSIS — M25552 Pain in left hip: Secondary | ICD-10-CM | POA: Diagnosis not present

## 2017-09-25 MED ORDER — ZOLPIDEM TARTRATE 10 MG PO TABS: 5.0000 mg | ORAL_TABLET | Freq: Every evening | ORAL | 1 refills | Status: DC | PRN

## 2017-09-25 NOTE — Telephone Encounter (Signed)
PA approved, effective 09/21/2017 through 09/22/2018.

## 2017-09-25 NOTE — Telephone Encounter (Signed)
PA initiated via Covermymeds; KEY: [id number]. Awaiting determination.

## 2017-09-25 NOTE — Telephone Encounter (Signed)
°  Relation to MV:[facility] Call back number: [phone]   Reason for call:  Patient confirming if my chart message was received regarding Ambien Rx, please advise

## 2017-09-29 DIAGNOSIS — Z471 Aftercare following joint replacement surgery: Secondary | ICD-10-CM | POA: Diagnosis not present

## 2017-09-29 DIAGNOSIS — Z96642 Presence of left artificial hip joint: Secondary | ICD-10-CM | POA: Diagnosis not present

## 2017-09-29 DIAGNOSIS — M25552 Pain in left hip: Secondary | ICD-10-CM | POA: Diagnosis not present

## 2017-10-02 DIAGNOSIS — Z471 Aftercare following joint replacement surgery: Secondary | ICD-10-CM | POA: Diagnosis not present

## 2017-10-02 DIAGNOSIS — Z96642 Presence of left artificial hip joint: Secondary | ICD-10-CM | POA: Diagnosis not present

## 2017-10-02 DIAGNOSIS — M25552 Pain in left hip: Secondary | ICD-10-CM | POA: Diagnosis not present

## 2017-10-07 DIAGNOSIS — Z96642 Presence of left artificial hip joint: Secondary | ICD-10-CM | POA: Diagnosis not present

## 2017-10-07 DIAGNOSIS — Z471 Aftercare following joint replacement surgery: Secondary | ICD-10-CM | POA: Diagnosis not present

## 2017-10-07 DIAGNOSIS — M25552 Pain in left hip: Secondary | ICD-10-CM | POA: Diagnosis not present

## 2017-10-08 DIAGNOSIS — Z471 Aftercare following joint replacement surgery: Secondary | ICD-10-CM | POA: Diagnosis not present

## 2017-10-08 DIAGNOSIS — M25552 Pain in left hip: Secondary | ICD-10-CM | POA: Diagnosis not present

## 2017-10-08 DIAGNOSIS — Z96642 Presence of left artificial hip joint: Secondary | ICD-10-CM | POA: Diagnosis not present

## 2017-10-15 DIAGNOSIS — Z96642 Presence of left artificial hip joint: Secondary | ICD-10-CM | POA: Diagnosis not present

## 2017-10-15 DIAGNOSIS — M25552 Pain in left hip: Secondary | ICD-10-CM | POA: Diagnosis not present

## 2017-10-15 DIAGNOSIS — Z471 Aftercare following joint replacement surgery: Secondary | ICD-10-CM | POA: Diagnosis not present

## 2017-10-21 DIAGNOSIS — Z96642 Presence of left artificial hip joint: Secondary | ICD-10-CM | POA: Diagnosis not present

## 2017-10-21 DIAGNOSIS — Z09 Encounter for follow-up examination after completed treatment for conditions other than malignant neoplasm: Secondary | ICD-10-CM | POA: Diagnosis not present

## 2017-11-03 DIAGNOSIS — R69 Illness, unspecified: Secondary | ICD-10-CM | POA: Diagnosis not present

## 2017-11-08 ENCOUNTER — Other Ambulatory Visit: Payer: Self-pay | Admitting: Cardiology

## 2017-11-10 NOTE — Telephone Encounter (Signed)
REFILL 

## 2017-12-07 ENCOUNTER — Other Ambulatory Visit: Payer: Self-pay | Admitting: Cardiology

## 2017-12-17 ENCOUNTER — Encounter: Payer: Self-pay | Admitting: Family Medicine

## 2017-12-18 ENCOUNTER — Encounter: Payer: Self-pay | Admitting: Family Medicine

## 2017-12-19 ENCOUNTER — Other Ambulatory Visit: Payer: Self-pay | Admitting: Family Medicine

## 2017-12-19 ENCOUNTER — Other Ambulatory Visit: Payer: Self-pay

## 2017-12-19 DIAGNOSIS — R05 Cough: Secondary | ICD-10-CM

## 2017-12-19 DIAGNOSIS — R059 Cough, unspecified: Secondary | ICD-10-CM

## 2017-12-19 MED ORDER — HYDROCODONE-HOMATROPINE 5-1.5 MG/5ML PO SYRP: 5.0000 mL | ORAL_SOLUTION | Freq: Four times a day (QID) | ORAL | 0 refills | Status: DC | PRN

## 2017-12-19 NOTE — Telephone Encounter (Signed)
Will refill Hycodan on behalf of reg PCP.

## 2017-12-19 NOTE — Telephone Encounter (Signed)
Dr. Nani Ravens can you e prescribe this medication Dr. Charlett Blake sent it but it keeps printing instead of going through

## 2017-12-26 NOTE — Telephone Encounter (Signed)
noted 

## 2018-01-03 ENCOUNTER — Encounter: Payer: Self-pay | Admitting: Family Medicine

## 2018-01-05 ENCOUNTER — Other Ambulatory Visit: Payer: Self-pay | Admitting: Family Medicine

## 2018-01-05 MED ORDER — DOXYCYCLINE HYCLATE 100 MG PO TABS: 100.0000 mg | ORAL_TABLET | Freq: Two times a day (BID) | ORAL | 0 refills | Status: DC

## 2018-01-20 ENCOUNTER — Encounter: Payer: Self-pay | Admitting: Family Medicine

## 2018-01-20 ENCOUNTER — Ambulatory Visit (INDEPENDENT_AMBULATORY_CARE_PROVIDER_SITE_OTHER): Payer: Medicare HMO | Admitting: Family Medicine

## 2018-01-20 VITALS — BP 131/64 | HR 88 | Temp 98.1°F | Resp 16 | Ht 70.08 in | Wt 183.0 lb

## 2018-01-20 DIAGNOSIS — E1169 Type 2 diabetes mellitus with other specified complication: Secondary | ICD-10-CM

## 2018-01-20 DIAGNOSIS — D649 Anemia, unspecified: Secondary | ICD-10-CM

## 2018-01-20 DIAGNOSIS — I5022 Chronic systolic (congestive) heart failure: Secondary | ICD-10-CM | POA: Diagnosis not present

## 2018-01-20 DIAGNOSIS — R252 Cramp and spasm: Secondary | ICD-10-CM

## 2018-01-20 DIAGNOSIS — I482 Chronic atrial fibrillation, unspecified: Secondary | ICD-10-CM

## 2018-01-20 DIAGNOSIS — I10 Essential (primary) hypertension: Secondary | ICD-10-CM

## 2018-01-20 DIAGNOSIS — R972 Elevated prostate specific antigen [PSA]: Secondary | ICD-10-CM | POA: Diagnosis not present

## 2018-01-20 DIAGNOSIS — E785 Hyperlipidemia, unspecified: Secondary | ICD-10-CM | POA: Diagnosis not present

## 2018-01-20 MED ORDER — RIVAROXABAN 20 MG PO TABS: ORAL_TABLET | ORAL | 0 refills | Status: DC

## 2018-01-20 NOTE — Patient Instructions (Addendum)
Umcka helps immune response as well  Shingrix is the new shingles shot, 2 shots over 2-6 months Cholesterol Cholesterol is a white, waxy, fat-like substance that is needed by the human body in small amounts. The liver makes all the cholesterol we need. Cholesterol is carried from the liver by the blood through the blood vessels. Deposits of cholesterol (plaques) may build up on blood vessel (artery) walls. Plaques make the arteries narrower and stiffer. Cholesterol plaques increase the risk for heart attack and stroke. You cannot feel your cholesterol level even if it is very high. The only way to know that it is high is to have a blood test. Once you know your cholesterol levels, you should keep a record of the test results. Work with your health care provider to keep your levels in the desired range. What do the results mean?  Total cholesterol is a rough measure of all the cholesterol in your blood.  LDL (low-density lipoprotein) is the "bad" cholesterol. This is the type that causes plaque to build up on the artery walls. You want this level to be low.  HDL (high-density lipoprotein) is the "good" cholesterol because it cleans the arteries and carries the LDL away. You want this level to be high.  Triglycerides are fat that the body can either burn for energy or store. High levels are closely linked to heart disease. What are the desired levels of cholesterol?  Total cholesterol below 200.  LDL below 100 for people who are at risk, below 70 for people at very high risk.  HDL above 40 is good. A level of 60 or higher is considered to be protective against heart disease.  Triglycerides below 150. How can I lower my cholesterol? Diet Follow your diet program as told by your health care provider.  Choose fish or white meat chicken and [place], roasted or baked. Limit fatty cuts of red meat, fried foods, and processed meats, such as sausage and lunch meats.  Eat lots of fresh fruits and  vegetables.  Choose whole grains, beans, pasta, potatoes, and cereals.  Choose olive oil, corn oil, or canola oil, and use only small amounts.  Avoid butter, mayonnaise, shortening, or palm kernel oils.  Avoid foods with trans fats.  Drink skim or nonfat milk and eat low-fat or nonfat yogurt and cheeses. Avoid whole milk, cream, ice cream, egg yolks, and full-fat cheeses.  Healthier desserts include angel food cake, ginger snaps, animal crackers, hard candy, popsicles, and low-fat or nonfat frozen yogurt. Avoid pastries, cakes, pies, and cookies.  Exercise  Follow your exercise program as told by your health care provider. A regular program: ? Helps to decrease LDL and raise HDL. ? Helps with weight control.  Do things that increase your activity level, such as gardening, walking, and taking the stairs.  Ask your health care provider about ways that you can be more active in your daily life.  Medicine  Take over-the-counter and prescription medicines only as told by your health care provider. ? Medicine may be prescribed by your health care provider to help lower cholesterol and decrease the risk for heart disease. This is usually done if diet and exercise have failed to bring down cholesterol levels. ? If you have several risk factors, you may need medicine even if your levels are normal.  This information is not intended to replace advice given to you by your health care provider. Make sure you discuss any questions you have with your health care provider. Document  Released: 06/04/2001 Document Revised: 04/06/2016 Document Reviewed: 03/09/2016 Elsevier Interactive Patient Education  [employer].

## 2018-01-20 NOTE — Progress Notes (Signed)
Subjective:  I acted as a Education administrator for [employer]. Yancey Flemings, [place]   Patient ID: Francis Shaw, male    DOB: 07-25-1941, 77 y.o.   MRN: [id number]  Chief Complaint  Patient presents with  . Follow-up    HPI  Patient is in today for follow up visit and he feels well. No recent febrile illness or hospitalizations. He is trying to maintain a heart healthy diet and stays active. Denies CP/palp/SOB/HA/congestion/fevers/GI or GU c/o. Taking meds as prescribed. Has noted some lower extremity cramps recently. Worse at night.   Patient Care Team: Mosie Lukes, MD as PCP - General (Family Medicine) Stanford Breed Denice Bors, MD as Consulting Physician (Cardiology)   Past Medical History:  Diagnosis Date  . Allergy   . Arthritis    left hip  . Atrial fibrillation ([place])    a. Eliquis initiated 04/2013.  Marland Kitchen Atrial flutter ([place])   . Constipation 07/15/2017  . Dermatitis 11/22/2012  . Esophageal reflux 04/12/2013  . Hypertension   . Nonischemic cardiomyopathy ([place])    a. presumed to be tachy mediated;  b. 03/2013 Echo: EF 30-35%, diff HK, mild LVH, mildly dil LA.  Marland Kitchen Pericarditis   . Plantar wart, right foot 03/26/2017  . Skin lesion 11/26/2011    Past Surgical History:  Procedure Laterality Date  . CALCANEAL OSTEOTOMY Right 1993   "stapled it; screwed it" (04/28/2013)  . CALCANEUS HARDWARE REMOVAL Right ~ 1994  . COLONOSCOPY     10-12 years ago; unable to find records  . COLONOSCOPY    . 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 LEFT HEART CATHETERIZATION WITH CORONARY ANGIOGRAM N/A 09/30/2013   Procedure: LEFT HEART CATHETERIZATION WITH CORONARY ANGIOGRAM;  Surgeon: Burnell Blanks, MD;  Location: [facility] CATH LAB;  Service: Cardiovascular;  Laterality: N/A;  . TONSILLECTOMY  1940's  . 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 Stroke Father   . Asthma Mother        low back pain  . Colon cancer Neg Hx   . Esophageal cancer Neg Hx   . Rectal cancer Neg Hx   . Stomach cancer Neg Hx     Social History   Socioeconomic History  . Marital status: Married    Spouse name: Not on file  . Number of children: 2  . Years of education: Not on file  . Highest education level: Not on file  Occupational History  . Occupation: self emp  Social Needs  . Financial resource strain: Not on file  . Food insecurity:    Worry: Not on file    Inability: Not on file  . Transportation needs:    Medical: Not on file    Non-medical: Not on file  Tobacco Use  . Smoking status: Former Smoker    Packs/day: 1.00    Years: 18.00    Pack years: 18.00    Types: Cigarettes    Last attempt to quit: 09/24/1979    Years since quitting: 38.3  . Smokeless tobacco: Never Used  Substance and Sexual Activity  . Alcohol use: Yes    Alcohol/week: 0.0 oz    Comment: occasionally   . Drug use: No  . Sexual activity: Yes    Partners: Female  Lifestyle  . Physical activity:  Days per week: Not on file    Minutes per session: Not on file  . Stress: Not on file  Relationships  . Social connections:    Talks on phone: Not on file    Gets together: Not on file    Attends religious service: Not on file    Active member of club or organization: Not on file    Attends meetings of clubs or organizations: Not on file    Relationship status: Not on file  . Intimate partner violence:    Fear of current or ex partner: Not on file    Emotionally abused: Not on file    Physically abused: Not on file    Forced sexual activity: Not on file  Other Topics Concern  . Not on file  Social History Narrative  . Not on file    Outpatient Medications Prior to Visit  Medication Sig Dispense Refill  . carvedilol (COREG) 12.5 MG tablet Take 1.5 tablets (18.75 mg total) by mouth 2 (two) times daily with a meal.  225 tablet 3  . cetirizine (ZYRTEC) 10 MG tablet Take 1 tablet (10 mg total) by mouth daily as needed for allergies. 30 tablet 1  . clobetasol cream (TEMOVATE) 2.42 % Apply 1 application topically 2 (two) times daily as needed (anal itch). Reported on 11/07/2015 30 g 0  . HYDROcodone-homatropine (HYCODAN) 5-1.5 MG/5ML syrup Take 5 mLs by mouth every 6 (six) hours as needed for cough. 120 mL 0  . losartan (COZAAR) 25 MG tablet TAKE 1 TABLET BY MOUTH EVERY DAY 90 tablet 0  . Multiple Vitamins-Minerals (CENTRUM SILVER PO) Take 1 tablet by mouth daily.    Marland Kitchen oxyCODONE-acetaminophen (ROXICET) 5-325 MG tablet Take 1 tablet every 4 (four) hours as needed by mouth. 60 tablet 0  . polyethylene glycol (MIRALAX) packet Take 17 g by mouth daily. 30 each 1  . tiZANidine (ZANAFLEX) 2 MG tablet Take 1 tablet (2 mg total) every 6 (six) hours as needed by mouth for muscle spasms. 60 tablet 0  . traMADol (ULTRAM) 50 MG tablet Take 1 tablet (50 mg total) by mouth 2 (two) times daily as needed for moderate pain. 30 tablet 0  . Wheat Dextrin (BENEFIBER PO) Take 1 scoop by mouth daily.    Marland Kitchen doxycycline (VIBRA-TABS) 100 MG tablet Take 1 tablet (100 mg total) by mouth 2 (two) times daily. 20 tablet 0  . rivaroxaban (XARELTO) 10 MG TABS tablet Take 1 tablet (10 mg total) daily by mouth. 14 tablet 0  . XARELTO 20 MG TABS tablet TAKE 1 TABLET BY MOUTH EVERY DAY WITH DINNER 90 tablet 0  . zolpidem (AMBIEN) 10 MG tablet Take 0.5-1 tablets (5-10 mg total) by mouth at bedtime as needed for sleep. 15 tablet 1   No facility-administered medications prior to visit.     No Known Allergies  Review of Systems  Constitutional: Negative for fever and malaise/fatigue.  HENT: Negative for congestion.   Eyes: Negative for blurred vision.  Respiratory: Negative for shortness of breath.   Cardiovascular: Negative for chest pain, palpitations and leg swelling.  Gastrointestinal: Negative for abdominal pain, blood in stool and nausea.   Genitourinary: Negative for dysuria and frequency.  Musculoskeletal: Negative for falls.  Skin: Negative for rash.  Neurological: Negative for dizziness, loss of consciousness and headaches.  Endo/Heme/Allergies: Negative for environmental allergies.  Psychiatric/Behavioral: Negative for depression. The patient is not nervous/anxious.        Objective:    Physical Exam  Constitutional: He is oriented to person, place, and time. He appears well-developed and well-nourished. No distress.  HENT:  Head: Normocephalic and atraumatic.  Nose: Nose normal.  Eyes: Right eye exhibits no discharge. Left eye exhibits no discharge.  Neck: Normal range of motion. Neck supple.  Cardiovascular: Normal rate and regular rhythm.  No murmur heard. Pulmonary/Chest: Effort normal and breath sounds normal.  Abdominal: Soft. Bowel sounds are normal. There is no tenderness.  Musculoskeletal: He exhibits no edema.  Neurological: He is alert and oriented to person, place, and time.  Skin: Skin is warm and dry.  Psychiatric: He has a normal mood and affect.  Nursing note and vitals reviewed.   BP 131/64 (BP Location: Left Arm, Patient Position: Sitting, Cuff Size: Normal)   Pulse 88   Temp 98.1 F (36.7 C) (Oral)   Resp 16   Ht 5' 10.08" (1.78 m)   Wt 183 lb (83 kg)   SpO2 96%   BMI 26.20 kg/m  Wt Readings from Last 3 Encounters:  01/20/18 183 lb (83 kg)  07/28/17 182 lb 9.6 oz (82.8 kg)  07/15/17 182 lb 9.6 oz (82.8 kg)   BP Readings from Last 3 Encounters:  01/20/18 131/64  07/29/17 122/76  07/15/17 116/60     Immunization History  Administered Date(s) Administered  . Pneumococcal Conjugate-13 05/01/2015  . Pneumococcal Polysaccharide-23 03/05/2012  . Td 09/23/2006  . Tdap 03/05/2012  . Zoster 10/24/2013    Health Maintenance  Topic Date Due  . FOOT EXAM  08/08/1952  . OPHTHALMOLOGY EXAM  08/08/1952  . INFLUENZA VACCINE  04/23/2018  . HEMOGLOBIN A1C  07/22/2018  . COLONOSCOPY   05/22/2019  . TETANUS/TDAP  03/05/2022  . PNA vac Low Risk Adult  Completed    Lab Results  Component Value Date   WBC 6.5 01/20/2018   HGB 14.3 01/20/2018   HCT 42.1 01/20/2018   PLT 189.0 01/20/2018   GLUCOSE 92 01/20/2018   CHOL 151 01/20/2018   TRIG 96.0 01/20/2018   HDL 44.20 01/20/2018   LDLCALC 87 01/20/2018   ALT 11 01/20/2018   AST 14 01/20/2018   NA 142 01/20/2018   K 4.1 01/20/2018   CL 105 01/20/2018   CREATININE 0.81 01/20/2018   BUN 11 01/20/2018   CO2 31 01/20/2018   TSH 1.29 01/20/2018   PSA 4.20 (H) 01/20/2018   INR 1.08 07/28/2017   HGBA1C 5.3 01/20/2018    Lab Results  Component Value Date   TSH 1.29 01/20/2018   Lab Results  Component Value Date   WBC 6.5 01/20/2018   HGB 14.3 01/20/2018   HCT 42.1 01/20/2018   MCV 89.2 01/20/2018   PLT 189.0 01/20/2018   Lab Results  Component Value Date   NA 142 01/20/2018   K 4.1 01/20/2018   CO2 31 01/20/2018   GLUCOSE 92 01/20/2018   BUN 11 01/20/2018   CREATININE 0.81 01/20/2018   BILITOT 0.6 01/20/2018   ALKPHOS 76 01/20/2018   AST 14 01/20/2018   ALT 11 01/20/2018   PROT 6.2 01/20/2018   ALBUMIN 3.7 01/20/2018   CALCIUM 9.3 01/20/2018   ANIONGAP 7 07/29/2017   GFR 98.62 01/20/2018   Lab Results  Component Value Date   CHOL 151 01/20/2018   Lab Results  Component Value Date   HDL 44.20 01/20/2018   Lab Results  Component Value Date   LDLCALC 87 01/20/2018   Lab Results  Component Value Date   TRIG 96.0 01/20/2018   Lab  Results  Component Value Date   CHOLHDL 3 01/20/2018   Lab Results  Component Value Date   HGBA1C 5.3 01/20/2018         Assessment & Plan:   Problem List Items Addressed This Visit    Hyperlipidemia    Encouraged heart healthy diet, increase exercise, avoid trans fats, consider a krill oil cap daily      Relevant Medications   rivaroxaban (XARELTO) 20 MG TABS tablet   Atrial fibrillation ([place]) - Primary    Rate controlled. Tolerating Xarelto       Relevant Medications   rivaroxaban (XARELTO) 20 MG TABS tablet   Other Relevant Orders   Comprehensive metabolic panel (Completed)   TSH (Completed)   Chronic systolic HF (heart failure) (HCC)    No recent exacerbation      Relevant Medications   rivaroxaban (XARELTO) 20 MG TABS tablet   Elevated PSA    Mild but persistent will refer to urology for further consideration.       Relevant Orders   PSA (Completed)   HTN (hypertension)    Well controlled, no changes to meds. Encouraged heart healthy diet such as the DASH diet and exercise as tolerated.       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 Robarts's rivaroxaban and doxycycline. I have also changed his XARELTO to rivaroxaban. Additionally, I am having him maintain his Multiple Vitamins-Minerals (CENTRUM SILVER PO), cetirizine, clobetasol cream, carvedilol, polyethylene glycol, Wheat Dextrin (BENEFIBER PO), traMADol, tiZANidine, oxyCODONE-acetaminophen, zolpidem, losartan, and HYDROcodone-homatropine.  Meds ordered this encounter  Medications  . rivaroxaban (XARELTO) 20 MG TABS tablet    Sig: TAKE 1 TABLET BY MOUTH EVERY DAY WITH DINNER    Dispense:  90 tablet    Refill:  0    CMA served as Education administrator during this visit. History, Physical and Plan performed by medical provider. Documentation and orders reviewed and attested to.  Penni Homans, MD

## 2018-01-21 ENCOUNTER — Other Ambulatory Visit: Payer: Self-pay | Admitting: Family Medicine

## 2018-01-21 DIAGNOSIS — R972 Elevated prostate specific antigen [PSA]: Secondary | ICD-10-CM

## 2018-01-21 LAB — COMPREHENSIVE METABOLIC PANEL
ALT: 11 U/L (ref 0–53)
AST: 14 U/L (ref 0–37)
Albumin: 3.7 g/dL (ref 3.5–5.2)
Alkaline Phosphatase: 76 U/L (ref 39–117)
BILIRUBIN TOTAL: 0.6 mg/dL (ref 0.2–1.2)
BUN: 11 mg/dL (ref 6–23)
CO2: 31 meq/L (ref 19–32)
Calcium: 9.3 mg/dL (ref 8.4–10.5)
Chloride: 105 mEq/L (ref 96–112)
Creatinine, Ser: 0.81 mg/dL (ref 0.40–1.50)
GFR: 98.62 mL/min (ref >60.00)
Glucose, Bld: 92 mg/dL (ref 70–99)
POTASSIUM: 4.1 meq/L (ref 3.5–5.1)
Sodium: 142 mEq/L (ref 135–145)
TOTAL PROTEIN: 6.2 g/dL (ref 6.0–8.3)

## 2018-01-21 LAB — CBC WITH DIFFERENTIAL/PLATELET
BASOS ABS: 0.1 10*3/uL (ref 0.0–0.1)
Basophils Relative: 1.5 % (ref 0.0–3.0)
EOS PCT: 4.2 % (ref 0.0–5.0)
Eosinophils Absolute: 0.3 10*3/uL (ref 0.0–0.7)
HEMATOCRIT: 42.1 % (ref 39.0–52.0)
Hemoglobin: 14.3 g/dL (ref 13.0–17.0)
LYMPHS ABS: 1.3 10*3/uL (ref 0.7–4.0)
LYMPHS PCT: 19.3 % (ref 12.0–46.0)
MCHC: 33.9 g/dL (ref 30.0–36.0)
MCV: 89.2 fl (ref 78.0–100.0)
MONOS PCT: 13.7 % — AB (ref 3.0–12.0)
Monocytes Absolute: 0.9 10*3/uL (ref 0.1–1.0)
NEUTROS ABS: 4 10*3/uL (ref 1.4–7.7)
Neutrophils Relative %: 61.3 % (ref 43.0–77.0)
PLATELETS: 189 10*3/uL (ref 150.0–400.0)
RBC: 4.71 Mil/uL (ref 4.22–5.81)
RDW: 13.8 % (ref 11.5–15.5)
WBC: 6.5 10*3/uL (ref 4.0–10.5)

## 2018-01-21 LAB — FERRITIN: FERRITIN: 110.4 ng/mL (ref 22.0–322.0)

## 2018-01-21 LAB — LIPID PANEL
Cholesterol: 151 mg/dL (ref 0–200)
HDL: 44.2 mg/dL (ref >39.00)
LDL Cholesterol: 87 mg/dL (ref 0–99)
NonHDL: 106.37
TRIGLYCERIDES: 96 mg/dL (ref 0.0–149.0)
Total CHOL/HDL Ratio: 3
VLDL: 19.2 mg/dL (ref 0.0–40.0)

## 2018-01-21 LAB — HEMOGLOBIN A1C: HEMOGLOBIN A1C: 5.3 % (ref 4.6–6.5)

## 2018-01-21 LAB — TSH: TSH: 1.29 u[IU]/mL (ref 0.35–4.50)

## 2018-01-21 LAB — MAGNESIUM: MAGNESIUM: 2 mg/dL (ref 1.5–2.5)

## 2018-01-21 LAB — PSA: PSA: 4.2 ng/mL — ABNORMAL HIGH (ref 0.10–4.00)

## 2018-01-25 DIAGNOSIS — R972 Elevated prostate specific antigen [PSA]: Secondary | ICD-10-CM | POA: Insufficient documentation

## 2018-01-25 DIAGNOSIS — R252 Cramp and spasm: Secondary | ICD-10-CM | POA: Insufficient documentation

## 2018-01-25 DIAGNOSIS — I10 Essential (primary) hypertension: Secondary | ICD-10-CM | POA: Insufficient documentation

## 2018-01-25 NOTE — Assessment & Plan Note (Signed)
No recent exacerbation 

## 2018-01-25 NOTE — Assessment & Plan Note (Signed)
Well controlled, no changes to meds. Encouraged heart healthy diet such as the DASH diet and exercise as tolerated.  °

## 2018-01-25 NOTE — Assessment & Plan Note (Signed)
Check labs, hydrate well

## 2018-01-25 NOTE — Assessment & Plan Note (Signed)
Rate controlled. Tolerating Xarelto

## 2018-01-25 NOTE — Assessment & Plan Note (Signed)
Mild but persistent will refer to urology for further consideration.

## 2018-01-25 NOTE — Assessment & Plan Note (Signed)
Encouraged heart healthy diet, increase exercise, avoid trans fats, consider a krill oil cap daily 

## 2018-01-31 ENCOUNTER — Other Ambulatory Visit: Payer: Self-pay | Admitting: Cardiology

## 2018-02-02 NOTE — Telephone Encounter (Signed)
Rx request sent to pharmacy.  

## 2018-02-17 DIAGNOSIS — M25552 Pain in left hip: Secondary | ICD-10-CM | POA: Diagnosis not present

## 2018-02-21 ENCOUNTER — Other Ambulatory Visit: Payer: Self-pay | Admitting: Family Medicine

## 2018-02-21 DIAGNOSIS — R059 Cough, unspecified: Secondary | ICD-10-CM

## 2018-02-21 DIAGNOSIS — R05 Cough: Secondary | ICD-10-CM

## 2018-02-24 DIAGNOSIS — J069 Acute upper respiratory infection, unspecified: Secondary | ICD-10-CM | POA: Diagnosis not present

## 2018-02-25 DIAGNOSIS — R311 Benign essential microscopic hematuria: Secondary | ICD-10-CM | POA: Diagnosis not present

## 2018-02-25 DIAGNOSIS — R972 Elevated prostate specific antigen [PSA]: Secondary | ICD-10-CM | POA: Diagnosis not present

## 2018-02-25 MED ORDER — HYDROCODONE-HOMATROPINE 5-1.5 MG/5ML PO SYRP: 5.0000 mL | ORAL_SOLUTION | Freq: Four times a day (QID) | ORAL | 0 refills | Status: DC | PRN

## 2018-02-25 NOTE — Telephone Encounter (Signed)
Requesting: HYCODAN Contract:12/19/14 UDS:12/19/14 Low risk Last OV: 01/20/18 Next OV: 07/23/18 Last Refill: 12/19/17   Please advise

## 2018-03-01 DIAGNOSIS — J069 Acute upper respiratory infection, unspecified: Secondary | ICD-10-CM | POA: Diagnosis not present

## 2018-03-04 DIAGNOSIS — R69 Illness, unspecified: Secondary | ICD-10-CM | POA: Diagnosis not present

## 2018-03-05 DIAGNOSIS — N200 Calculus of kidney: Secondary | ICD-10-CM | POA: Diagnosis not present

## 2018-03-05 DIAGNOSIS — R311 Benign essential microscopic hematuria: Secondary | ICD-10-CM | POA: Diagnosis not present

## 2018-04-15 DIAGNOSIS — R311 Benign essential microscopic hematuria: Secondary | ICD-10-CM | POA: Diagnosis not present

## 2018-04-15 DIAGNOSIS — N401 Enlarged prostate with lower urinary tract symptoms: Secondary | ICD-10-CM | POA: Diagnosis not present

## 2018-04-15 DIAGNOSIS — R3912 Poor urinary stream: Secondary | ICD-10-CM | POA: Diagnosis not present

## 2018-04-27 DIAGNOSIS — J101 Influenza due to other identified influenza virus with other respiratory manifestations: Secondary | ICD-10-CM | POA: Diagnosis not present

## 2018-05-02 ENCOUNTER — Other Ambulatory Visit: Payer: Self-pay | Admitting: Cardiology

## 2018-05-03 DIAGNOSIS — H109 Unspecified conjunctivitis: Secondary | ICD-10-CM | POA: Diagnosis not present

## 2018-05-03 DIAGNOSIS — J019 Acute sinusitis, unspecified: Secondary | ICD-10-CM | POA: Diagnosis not present

## 2018-05-11 DIAGNOSIS — R69 Illness, unspecified: Secondary | ICD-10-CM | POA: Diagnosis not present

## 2018-05-22 DIAGNOSIS — M545 Low back pain: Secondary | ICD-10-CM | POA: Diagnosis not present

## 2018-05-23 DIAGNOSIS — J069 Acute upper respiratory infection, unspecified: Secondary | ICD-10-CM | POA: Diagnosis not present

## 2018-05-26 ENCOUNTER — Other Ambulatory Visit: Payer: Self-pay | Admitting: Cardiology

## 2018-05-26 DIAGNOSIS — I428 Other cardiomyopathies: Secondary | ICD-10-CM

## 2018-05-27 DIAGNOSIS — M47816 Spondylosis without myelopathy or radiculopathy, lumbar region: Secondary | ICD-10-CM | POA: Diagnosis not present

## 2018-05-29 DIAGNOSIS — M47816 Spondylosis without myelopathy or radiculopathy, lumbar region: Secondary | ICD-10-CM | POA: Diagnosis not present

## 2018-06-01 NOTE — Progress Notes (Signed)
HPI: FU atrial fibrillation/atrial flutter. Echocardiogram in July of 2014 showed moderate left ventricular enlargement, mild left ventricular hypertrophy and an ejection fraction of 30-35% with diffuse hypokinesis. There was mild left atrial enlargement. TSH in July of 2014 normal. When I initially saw the patient in August of 2014 he was in atrial flutter at a rate of 150 (also with atrial fibrillation on telemetry and flutter ablation not pursued). Patient was scheduled for elective cardioversion but when he came to the procedure he was in sinus rhythm. Cardiac catheterization January 2015 showed no obstructive coronary disease and ejection fraction 35%. Patient noted to be in recurrent atrial fibrillation at last office visit.  Plan was rate control.  Echocardiogram October 2018 showed ejection fraction 45 to 50%, moderate diastolic dysfunction.  Monitor October 2018 showed patient had converted back to sinus rhythm and there were PVCs noted.  Since he was last seen,  patient denies dyspnea, chest pain, palpitations or syncope.  He has had hematuria which is being evaluated by urology.  Worse since beginning meloxicam.  Current Outpatient Medications  Medication Sig Dispense Refill  . carvedilol (COREG) 12.5 MG tablet TAKE 1 AND 1/2 TABLETS TWICE DAILY. NEED OV. 270 tablet 0  . cetirizine (ZYRTEC) 10 MG tablet Take 1 tablet (10 mg total) by mouth daily as needed for allergies. 30 tablet 1  . clobetasol cream (TEMOVATE) 2.87 % Apply 1 application topically 2 (two) times daily as needed (anal itch). Reported on 11/07/2015 30 g 0  . HYDROcodone-homatropine (HYCODAN) 5-1.5 MG/5ML syrup Take 5 mLs by mouth every 6 (six) hours as needed for cough. 120 mL 0  . losartan (COZAAR) 25 MG tablet TAKE 1 TABLET BY MOUTH EVERY DAY 90 tablet 1  . Multiple Vitamins-Minerals (CENTRUM SILVER PO) Take 1 tablet by mouth daily.    Marland Kitchen oxyCODONE-acetaminophen (ROXICET) 5-325 MG tablet Take 1 tablet every 4 (four)  hours as needed by mouth. 60 tablet 0  . polyethylene glycol (MIRALAX) packet Take 17 g by mouth daily. 30 each 1  . rivaroxaban (XARELTO) 20 MG TABS tablet TAKE 1 TABLET BY MOUTH EVERY DAY WITH DINNER 90 tablet 0  . tiZANidine (ZANAFLEX) 2 MG tablet Take 1 tablet (2 mg total) every 6 (six) hours as needed by mouth for muscle spasms. 60 tablet 0  . traMADol (ULTRAM) 50 MG tablet Take 1 tablet (50 mg total) by mouth 2 (two) times daily as needed for moderate pain. 30 tablet 0  . Wheat Dextrin (BENEFIBER PO) Take 1 scoop by mouth daily.    Marland Kitchen zolpidem (AMBIEN) 10 MG tablet Take 0.5-1 tablets (5-10 mg total) by mouth at bedtime as needed for sleep. 15 tablet 1   No current facility-administered medications for this visit.      Past Medical History:  Diagnosis Date  . Allergy   . Arthritis    left hip  . Atrial fibrillation ([place])    a. Eliquis initiated 04/2013.  Marland Kitchen Atrial flutter ([place])   . Constipation 07/15/2017  . Dermatitis 11/22/2012  . Esophageal reflux 04/12/2013  . Hypertension   . Nonischemic cardiomyopathy ([place])    a. presumed to be tachy mediated;  b. 03/2013 Echo: EF 30-35%, diff HK, mild LVH, mildly dil LA.  Marland Kitchen Pericarditis   . Plantar wart, right foot 03/26/2017  . Skin lesion 11/26/2011    Past Surgical History:  Procedure Laterality Date  . CALCANEAL OSTEOTOMY Right 1993   "stapled it; screwed it" (04/28/2013)  . CALCANEUS  HARDWARE REMOVAL Right ~ 1994  . COLONOSCOPY     10-12 years ago; unable to find records  . COLONOSCOPY    . 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 LEFT HEART CATHETERIZATION WITH CORONARY ANGIOGRAM N/A 09/30/2013   Procedure: LEFT HEART CATHETERIZATION WITH CORONARY ANGIOGRAM;  Surgeon: Burnell Blanks, MD;  Location: [facility] CATH LAB;  Service: Cardiovascular;  Laterality: N/A;  . TONSILLECTOMY  1940's  . 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 Marital status: Married    Spouse name: Not on file  . Number of children: 2  . Years of education: Not on file  . Highest education level: Not on file  Occupational History  . Occupation: self emp  Social Needs  . Financial resource strain: Not on file  . Food insecurity:    Worry: Not on file    Inability: Not on file  . Transportation needs:    Medical: Not on file    Non-medical: Not on file  Tobacco Use  . Smoking status: Former Smoker    Packs/day: 1.00    Years: 18.00    Pack years: 18.00    Types: Cigarettes    Last attempt to quit: 09/24/1979    Years since quitting: 38.7  . Smokeless tobacco: Never Used  Substance and Sexual Activity  . Alcohol use: Yes    Alcohol/week: 0.0 standard drinks    Comment: occasionally   . Drug use: No  . Sexual activity: Yes    Partners: Female  Lifestyle  . Physical activity:    Days per week: Not on file    Minutes per session: Not on file  . Stress: Not on file  Relationships  . Social connections:    Talks on phone: Not on file    Gets together: Not on file    Attends religious service: Not on file    Active member of club or organization: Not on file    Attends meetings of clubs or organizations: Not on file    Relationship status: Not on file  . Intimate partner violence:    Fear of current or ex partner: Not on file    Emotionally abused: Not on file    Physically abused: Not on file    Forced sexual activity: Not on file  Other Topics Concern  . Not on file  Social History Narrative  . Not on file    Family History  Problem Relation Age of Onset  . Stroke Father   . Asthma Mother        low back pain  . Colon cancer Neg Hx   . Esophageal cancer Neg Hx   . Rectal cancer Neg Hx   . Stomach cancer Neg Hx     ROS: Hematuria and low back pain but no fevers or chills, productive cough, hemoptysis, dysphasia, odynophagia,  melena, hematochezia, dysuria, rash, seizure activity, orthopnea, PND, pedal edema, claudication. Remaining systems are negative.  Physical Exam: Well-developed well-nourished in no acute distress.  Skin is warm and dry.  HEENT is normal.  Neck is supple.  Chest is clear to auscultation with normal expansion.  Cardiovascular exam is regular rate and rhythm.  Abdominal exam nontender or distended. No masses palpated. Extremities show no edema. neuro grossly intact  ECG-normal sinus  rhythm at a rate of 71.  Left bundle branch block.  Personally reviewed  A/P  1.  Paroxysmal atrial fibrillation-patient is in sinus rhythm this morning.  We will continue carvedilol for rate control if atrial fibrillation recurs.  Continue Xarelto.  Check hemoglobin and renal function.  2 history of atrial flutter-he has not had an ablation previously because of concurrent atrial fibrillation.  3 history of nonischemic cardiomyopathy-continue carvedilol (increase to 25 mg BID) and Cozaar.  4 hematuria-further evaluation per urology.  I have asked him to discontinue meloxicam given potential interaction with excess bleeding in addition to Xarelto.  Kirk Ruths, MD

## 2018-06-02 DIAGNOSIS — M47816 Spondylosis without myelopathy or radiculopathy, lumbar region: Secondary | ICD-10-CM | POA: Diagnosis not present

## 2018-06-03 ENCOUNTER — Encounter: Payer: Self-pay | Admitting: Cardiology

## 2018-06-03 ENCOUNTER — Ambulatory Visit: Payer: Medicare HMO | Admitting: Cardiology

## 2018-06-03 VITALS — BP 124/76 | HR 71 | Ht 70.8 in | Wt 188.0 lb

## 2018-06-03 DIAGNOSIS — Z79899 Other long term (current) drug therapy: Secondary | ICD-10-CM

## 2018-06-03 DIAGNOSIS — I480 Paroxysmal atrial fibrillation: Secondary | ICD-10-CM

## 2018-06-03 DIAGNOSIS — I428 Other cardiomyopathies: Secondary | ICD-10-CM

## 2018-06-03 MED ORDER — CARVEDILOL 25 MG PO TABS: ORAL_TABLET | ORAL | 3 refills | Status: DC

## 2018-06-03 MED ORDER — CARVEDILOL 25 MG PO TABS: 25.0000 mg | ORAL_TABLET | Freq: Two times a day (BID) | ORAL | 3 refills | Status: DC

## 2018-06-03 NOTE — Patient Instructions (Addendum)
Medication Instructions: Your Physician recommend you make the following changes to your medication. Increase: Caredilol 25 mg two times a day Stop: Meloxicam    If you need a refill on your cardiac medications before your next appointment, please call your pharmacy.   Labwork: Your physician recommends that you return for lab work Today (CBC, BMP)   Procedures/Testing: None  Follow-Up: Your physician wants you to follow-up in 1 year with Dr. Stanford Breed. You will receive a reminder letter in the mail two months in advance. If you don't receive a letter, please call our office at [phone] to schedule this follow-up appointment.   Special Instructions:    Thank you for choosing [facility] at [facility]!!

## 2018-06-04 DIAGNOSIS — M47816 Spondylosis without myelopathy or radiculopathy, lumbar region: Secondary | ICD-10-CM | POA: Diagnosis not present

## 2018-06-04 LAB — CBC
Hematocrit: 39.1 % (ref 37.5–51.0)
Hemoglobin: 13.2 g/dL (ref 13.0–17.7)
MCH: 29.9 pg (ref 26.6–33.0)
MCHC: 33.8 g/dL (ref 31.5–35.7)
MCV: 89 fL (ref 79–97)
PLATELETS: 175 10*3/uL (ref 150–450)
RBC: 4.41 x10E6/uL (ref 4.14–5.80)
RDW: 12.8 % (ref 12.3–15.4)
WBC: 5.6 10*3/uL (ref 3.4–10.8)

## 2018-06-04 LAB — BASIC METABOLIC PANEL
BUN / CREAT RATIO: 16 (ref 10–24)
BUN: 14 mg/dL (ref 8–27)
CALCIUM: 9 mg/dL (ref 8.6–10.2)
CHLORIDE: 104 mmol/L (ref 96–106)
CO2: 24 mmol/L (ref 20–29)
Creatinine, Ser: 0.85 mg/dL (ref 0.76–1.27)
GFR calc non Af Amer: 85 mL/min/{1.73_m2} (ref >59)
GFR, EST AFRICAN AMERICAN: 99 mL/min/{1.73_m2} (ref >59)
Glucose: 114 mg/dL — ABNORMAL HIGH (ref 65–99)
POTASSIUM: 4.5 mmol/L (ref 3.5–5.2)
Sodium: 143 mmol/L (ref 134–144)

## 2018-06-08 ENCOUNTER — Ambulatory Visit (INDEPENDENT_AMBULATORY_CARE_PROVIDER_SITE_OTHER): Payer: Medicare HMO | Admitting: Family Medicine

## 2018-06-08 DIAGNOSIS — I10 Essential (primary) hypertension: Secondary | ICD-10-CM

## 2018-06-08 DIAGNOSIS — E785 Hyperlipidemia, unspecified: Secondary | ICD-10-CM | POA: Diagnosis not present

## 2018-06-08 DIAGNOSIS — J301 Allergic rhinitis due to pollen: Secondary | ICD-10-CM

## 2018-06-08 DIAGNOSIS — I480 Paroxysmal atrial fibrillation: Secondary | ICD-10-CM | POA: Diagnosis not present

## 2018-06-08 DIAGNOSIS — K219 Gastro-esophageal reflux disease without esophagitis: Secondary | ICD-10-CM

## 2018-06-08 MED ORDER — HYDROXYZINE HCL 25 MG PO TABS: 25.0000 mg | ORAL_TABLET | Freq: Every evening | ORAL | 1 refills | Status: DC | PRN

## 2018-06-08 NOTE — Patient Instructions (Addendum)
Encouraged increased rest and hydration, add probiotics, zinc such as Coldeze or Xicam. Treat fevers as needed.60 -80 oz of clear fluids daily  Plain mucinex twice daily  Zyrtec/Cetirizine 10  Mg in am or Allegra 180 mg in am Flonase once daily  Tylenol/Acetaminophen ES 500 mg start at 1 tab twice daily and every few weeks can titrate up dosing to a max of 2 tabs three x daily  Lidocaine patch or gel can help back pain, Icy Hot, Aspercreme or Salon Pas   Cough, Adult A cough helps to clear your throat and lungs. A cough may last only 2-3 weeks (acute), or it may last longer than 8 weeks (chronic). Many different things can cause a cough. A cough may be a sign of an illness or another medical condition. Follow these instructions at home:  Pay attention to any changes in your cough.  Take medicines only as told by your doctor. ? If you were prescribed an antibiotic medicine, take it as told by your doctor. Do not stop taking it even if you start to feel better. ? Talk with your doctor before you try using a cough medicine.  Drink enough fluid to keep your pee (urine) clear or pale yellow.  If the air is dry, use a cold steam vaporizer or humidifier in your home.  Stay away from things that make you cough at work or at home.  If your cough is worse at night, try using extra pillows to raise your head up higher while you sleep.  Do not smoke, and try not to be around smoke. If you need help quitting, ask your doctor.  Do not have caffeine.  Do not drink alcohol.  Rest as needed. Contact a doctor if:  You have new problems (symptoms).  You cough up yellow fluid (pus).  Your cough does not get better after 2-3 weeks, or your cough gets worse.  Medicine does not help your cough and you are not sleeping well.  You have pain that gets worse or pain that is not helped with medicine.  You have a fever.  You are losing weight and you do not know why.  You have night  sweats. Get help right away if:  You cough up blood.  You have trouble breathing.  Your heartbeat is very fast. This information is not intended to replace advice given to you by your health care provider. Make sure you discuss any questions you have with your health care provider. Document Released: 05/23/2011 Document Revised: 02/15/2016 Document Reviewed: 11/16/2014 Elsevier Interactive Patient Education  [employer].

## 2018-06-08 NOTE — Assessment & Plan Note (Signed)
Try antihistamines twice daily and add a dose of Flonase

## 2018-06-08 NOTE — Assessment & Plan Note (Signed)
Well controlled, no changes to meds. Encouraged heart healthy diet such as the DASH diet and exercise as tolerated.  °

## 2018-06-08 NOTE — Assessment & Plan Note (Addendum)
Restart Zantac and avoid offending foods.

## 2018-06-10 DIAGNOSIS — M47816 Spondylosis without myelopathy or radiculopathy, lumbar region: Secondary | ICD-10-CM | POA: Diagnosis not present

## 2018-06-10 NOTE — Progress Notes (Signed)
Subjective:    Patient ID: Francis Shaw, male    DOB: 1941-08-25, 77 y.o.   MRN: [id number]  No chief complaint on file.   HPI Patient is in today for follow up. No recent febrile illness or hospitalizations. He notes some congestion and cough at times. No fevers or chills. He otherwise feels well. Denies CP/palp/SOB/HA/fevers/GI or GU c/o. Taking meds as prescribed  Past Medical History:  Diagnosis Date  . Allergy   . Arthritis    left hip  . Atrial fibrillation ([place])    a. Eliquis initiated 04/2013.  Marland Kitchen Atrial flutter ([place])   . Constipation 07/15/2017  . Dermatitis 11/22/2012  . Esophageal reflux 04/12/2013  . Hypertension   . Nonischemic cardiomyopathy ([place])    a. presumed to be tachy mediated;  b. 03/2013 Echo: EF 30-35%, diff HK, mild LVH, mildly dil LA.  Marland Kitchen Pericarditis   . Plantar wart, right foot 03/26/2017  . Skin lesion 11/26/2011    Past Surgical History:  Procedure Laterality Date  . CALCANEAL OSTEOTOMY Right 1993   "stapled it; screwed it" (04/28/2013)  . CALCANEUS HARDWARE REMOVAL Right ~ 1994  . COLONOSCOPY     10-12 years ago; unable to find records  . COLONOSCOPY    . 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 LEFT HEART CATHETERIZATION WITH CORONARY ANGIOGRAM N/A 09/30/2013   Procedure: LEFT HEART CATHETERIZATION WITH CORONARY ANGIOGRAM;  Surgeon: Burnell Blanks, MD;  Location: [facility] CATH LAB;  Service: Cardiovascular;  Laterality: N/A;  . TONSILLECTOMY  1940's  . 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 Stroke Father   . Asthma Mother        low back pain  . Colon cancer Neg Hx   . Esophageal cancer Neg Hx   . Rectal cancer Neg Hx   . Stomach cancer Neg Hx     Social History   Socioeconomic History  . Marital status: Married    Spouse name: Not on  file  . Number of children: 2  . Years of education: Not on file  . Highest education level: Not on file  Occupational History  . Occupation: self emp  Social Needs  . Financial resource strain: Not on file  . Food insecurity:    Worry: Not on file    Inability: Not on file  . Transportation needs:    Medical: Not on file    Non-medical: Not on file  Tobacco Use  . Smoking status: Former Smoker    Packs/day: 1.00    Years: 18.00    Pack years: 18.00    Types: Cigarettes    Last attempt to quit: 09/24/1979    Years since quitting: 38.7  . Smokeless tobacco: Never Used  Substance and Sexual Activity  . Alcohol use: Yes    Alcohol/week: 0.0 standard drinks    Comment: occasionally   . Drug use: No  . Sexual activity: Yes    Partners: Female  Lifestyle  . Physical activity:    Days per week: Not on file    Minutes per session: Not on file  . Stress: Not on file  Relationships  . Social connections:    Talks on phone: Not on file    Gets together: Not on file  Attends religious service: Not on file    Active member of club or organization: Not on file    Attends meetings of clubs or organizations: Not on file    Relationship status: Not on file  . Intimate partner violence:    Fear of current or ex partner: Not on file    Emotionally abused: Not on file    Physically abused: Not on file    Forced sexual activity: Not on file  Other Topics Concern  . Not on file  Social History Narrative  . Not on file    Outpatient Medications Prior to Visit  Medication Sig Dispense Refill  . carvedilol (COREG) 25 MG tablet Take 1 tablet (25 mg total) by mouth 2 (two) times daily. 180 tablet 3  . cetirizine (ZYRTEC) 10 MG tablet Take 1 tablet (10 mg total) by mouth daily as needed for allergies. 30 tablet 1  . clobetasol cream (TEMOVATE) 9.51 % Apply 1 application topically 2 (two) times daily as needed (anal itch). Reported on 11/07/2015 30 g 0  . losartan (COZAAR) 25 MG tablet  TAKE 1 TABLET BY MOUTH EVERY DAY 90 tablet 1  . Multiple Vitamins-Minerals (CENTRUM SILVER PO) Take 1 tablet by mouth daily.    Marland Kitchen oxyCODONE-acetaminophen (ROXICET) 5-325 MG tablet Take 1 tablet every 4 (four) hours as needed by mouth. 60 tablet 0  . polyethylene glycol (MIRALAX) packet Take 17 g by mouth daily. 30 each 1  . rivaroxaban (XARELTO) 20 MG TABS tablet TAKE 1 TABLET BY MOUTH EVERY DAY WITH DINNER 90 tablet 0  . tamsulosin (FLOMAX) 0.4 MG CAPS capsule Take 0.4 mg by mouth daily.  11  . tiZANidine (ZANAFLEX) 2 MG tablet Take 1 tablet (2 mg total) every 6 (six) hours as needed by mouth for muscle spasms. 60 tablet 0  . traMADol (ULTRAM) 50 MG tablet Take 1 tablet (50 mg total) by mouth 2 (two) times daily as needed for moderate pain. 30 tablet 0  . Wheat Dextrin (BENEFIBER PO) Take 1 scoop by mouth daily.    Marland Kitchen HYDROcodone-homatropine (HYCODAN) 5-1.5 MG/5ML syrup Take 5 mLs by mouth every 6 (six) hours as needed for cough. 120 mL 0  . meloxicam (MOBIC) 15 MG tablet Take 15 mg by mouth daily. with food  0  . zolpidem (AMBIEN) 10 MG tablet Take 0.5-1 tablets (5-10 mg total) by mouth at bedtime as needed for sleep. 15 tablet 1   No facility-administered medications prior to visit.     No Known Allergies  Review of Systems  Constitutional: Negative for fever and malaise/fatigue.  HENT: Positive for congestion.   Eyes: Negative for blurred vision.  Respiratory: Positive for cough. Negative for shortness of breath.   Cardiovascular: Negative for chest pain, palpitations and leg swelling.  Gastrointestinal: Negative for abdominal pain, blood in stool and nausea.  Genitourinary: Negative for dysuria and frequency.  Musculoskeletal: Negative for falls.  Skin: Negative for rash.  Neurological: Negative for dizziness, loss of consciousness and headaches.  Endo/Heme/Allergies: Negative for environmental allergies.  Psychiatric/Behavioral: Negative for depression. The patient is not  nervous/anxious.        Objective:    Physical Exam  Constitutional: He is oriented to person, place, and time. He appears well-developed and well-nourished. No distress.  HENT:  Head: Normocephalic and atraumatic.  Nose: Nose normal.  Eyes: Right eye exhibits no discharge. Left eye exhibits no discharge.  Neck: Normal range of motion. Neck supple.  Cardiovascular: Normal rate.  No murmur  heard. Irregularly irregular  Pulmonary/Chest: Effort normal and breath sounds normal.  Abdominal: Soft. Bowel sounds are normal. There is no tenderness.  Musculoskeletal: He exhibits no edema.  Neurological: He is alert and oriented to person, place, and time.  Skin: Skin is warm and dry.  Psychiatric: He has a normal mood and affect.  Nursing note and vitals reviewed.   BP (!) 152/82 (BP Location: Left Arm, Patient Position: Sitting, Cuff Size: Normal)   Pulse 68   Temp 97.8 F (36.6 C) (Oral)   Resp 18   Wt 186 lb 6.4 oz (84.6 kg)   SpO2 98%   BMI 26.14 kg/m  Wt Readings from Last 3 Encounters:  06/08/18 186 lb 6.4 oz (84.6 kg)  06/03/18 188 lb (85.3 kg)  01/20/18 183 lb (83 kg)     Lab Results  Component Value Date   WBC 5.6 06/03/2018   HGB 13.2 06/03/2018   HCT 39.1 06/03/2018   PLT 175 06/03/2018   GLUCOSE 114 (H) 06/03/2018   CHOL 151 01/20/2018   TRIG 96.0 01/20/2018   HDL 44.20 01/20/2018   LDLCALC 87 01/20/2018   ALT 11 01/20/2018   AST 14 01/20/2018   NA 143 06/03/2018   K 4.5 06/03/2018   CL 104 06/03/2018   CREATININE 0.85 06/03/2018   BUN 14 06/03/2018   CO2 24 06/03/2018   TSH 1.29 01/20/2018   PSA 4.20 (H) 01/20/2018   INR 1.08 07/28/2017   HGBA1C 5.3 01/20/2018    Lab Results  Component Value Date   TSH 1.29 01/20/2018   Lab Results  Component Value Date   WBC 5.6 06/03/2018   HGB 13.2 06/03/2018   HCT 39.1 06/03/2018   MCV 89 06/03/2018   PLT 175 06/03/2018   Lab Results  Component Value Date   NA 143 06/03/2018   K 4.5 06/03/2018    CO2 24 06/03/2018   GLUCOSE 114 (H) 06/03/2018   BUN 14 06/03/2018   CREATININE 0.85 06/03/2018   BILITOT 0.6 01/20/2018   ALKPHOS 76 01/20/2018   AST 14 01/20/2018   ALT 11 01/20/2018   PROT 6.2 01/20/2018   ALBUMIN 3.7 01/20/2018   CALCIUM 9.0 06/03/2018   ANIONGAP 7 07/29/2017   GFR 98.62 01/20/2018   Lab Results  Component Value Date   CHOL 151 01/20/2018   Lab Results  Component Value Date   HDL 44.20 01/20/2018   Lab Results  Component Value Date   LDLCALC 87 01/20/2018   Lab Results  Component Value Date   TRIG 96.0 01/20/2018   Lab Results  Component Value Date   CHOLHDL 3 01/20/2018   Lab Results  Component Value Date   HGBA1C 5.3 01/20/2018       Assessment & Plan:   Problem List Items Addressed This Visit    Hyperlipidemia    Encouraged heart healthy diet, increase exercise, avoid trans fats, consider a krill oil cap daily      Esophageal reflux    Restart Zantac and avoid offending foods.       Atrial fibrillation (HCC)    Rate controllled and tolerating Xarelto      Allergic rhinitis    Try antihistamines twice daily and add a dose of Flonase       HTN (hypertension)    Well controlled, no changes to meds. Encouraged heart healthy diet such as the DASH diet and exercise as tolerated.          I have discontinued Shanon Brow  L. Perryman's HYDROcodone-homatropine and meloxicam. I am also having him start on hydrOXYzine. Additionally, I am having him maintain his Multiple Vitamins-Minerals (CENTRUM SILVER PO), cetirizine, clobetasol cream, polyethylene glycol, Wheat Dextrin (BENEFIBER PO), traMADol, tiZANidine, oxyCODONE-acetaminophen, zolpidem, rivaroxaban, losartan, tamsulosin, and carvedilol.  Meds ordered this encounter  Medications  . hydrOXYzine (ATARAX/VISTARIL) 25 MG tablet    Sig: Take 1-2 tablets (25-50 mg total) by mouth at bedtime as needed.    Dispense:  30 tablet    Refill:  1     Penni Homans, MD

## 2018-06-10 NOTE — Assessment & Plan Note (Signed)
Encouraged heart healthy diet, increase exercise, avoid trans fats, consider a krill oil cap daily 

## 2018-06-10 NOTE — Assessment & Plan Note (Signed)
Rate controllled and tolerating Xarelto

## 2018-06-12 DIAGNOSIS — D229 Melanocytic nevi, unspecified: Secondary | ICD-10-CM | POA: Diagnosis not present

## 2018-06-12 DIAGNOSIS — L821 Other seborrheic keratosis: Secondary | ICD-10-CM | POA: Diagnosis not present

## 2018-06-12 DIAGNOSIS — D1801 Hemangioma of skin and subcutaneous tissue: Secondary | ICD-10-CM | POA: Diagnosis not present

## 2018-06-12 DIAGNOSIS — L570 Actinic keratosis: Secondary | ICD-10-CM | POA: Diagnosis not present

## 2018-06-12 DIAGNOSIS — L218 Other seborrheic dermatitis: Secondary | ICD-10-CM | POA: Diagnosis not present

## 2018-06-12 DIAGNOSIS — L819 Disorder of pigmentation, unspecified: Secondary | ICD-10-CM | POA: Diagnosis not present

## 2018-06-12 DIAGNOSIS — L814 Other melanin hyperpigmentation: Secondary | ICD-10-CM | POA: Diagnosis not present

## 2018-06-17 DIAGNOSIS — M47816 Spondylosis without myelopathy or radiculopathy, lumbar region: Secondary | ICD-10-CM | POA: Diagnosis not present

## 2018-06-24 ENCOUNTER — Encounter: Payer: Self-pay | Admitting: Family Medicine

## 2018-06-24 ENCOUNTER — Other Ambulatory Visit: Payer: Self-pay | Admitting: Family Medicine

## 2018-06-24 MED ORDER — CEFDINIR 300 MG PO CAPS: 300.0000 mg | ORAL_CAPSULE | Freq: Two times a day (BID) | ORAL | 0 refills | Status: DC

## 2018-06-25 MED ORDER — CEFDINIR 300 MG PO CAPS: 300.0000 mg | ORAL_CAPSULE | Freq: Two times a day (BID) | ORAL | 0 refills | Status: AC

## 2018-07-06 ENCOUNTER — Encounter: Payer: Self-pay | Admitting: Family Medicine

## 2018-07-06 ENCOUNTER — Other Ambulatory Visit: Payer: Self-pay | Admitting: Family Medicine

## 2018-07-06 MED ORDER — CEFDINIR 300 MG PO CAPS: 300.0000 mg | ORAL_CAPSULE | Freq: Two times a day (BID) | ORAL | 0 refills | Status: AC

## 2018-07-19 IMAGING — CR DG CHEST 2V
2 series · 2 of 2 positions shown · non-contrast
Comparison: CT 08/23/2015 .  Chest x-ray 08/15/2015.

CLINICAL DATA: Preoperative exam .

EXAM:
CHEST  2 VIEW

[w chest pa]
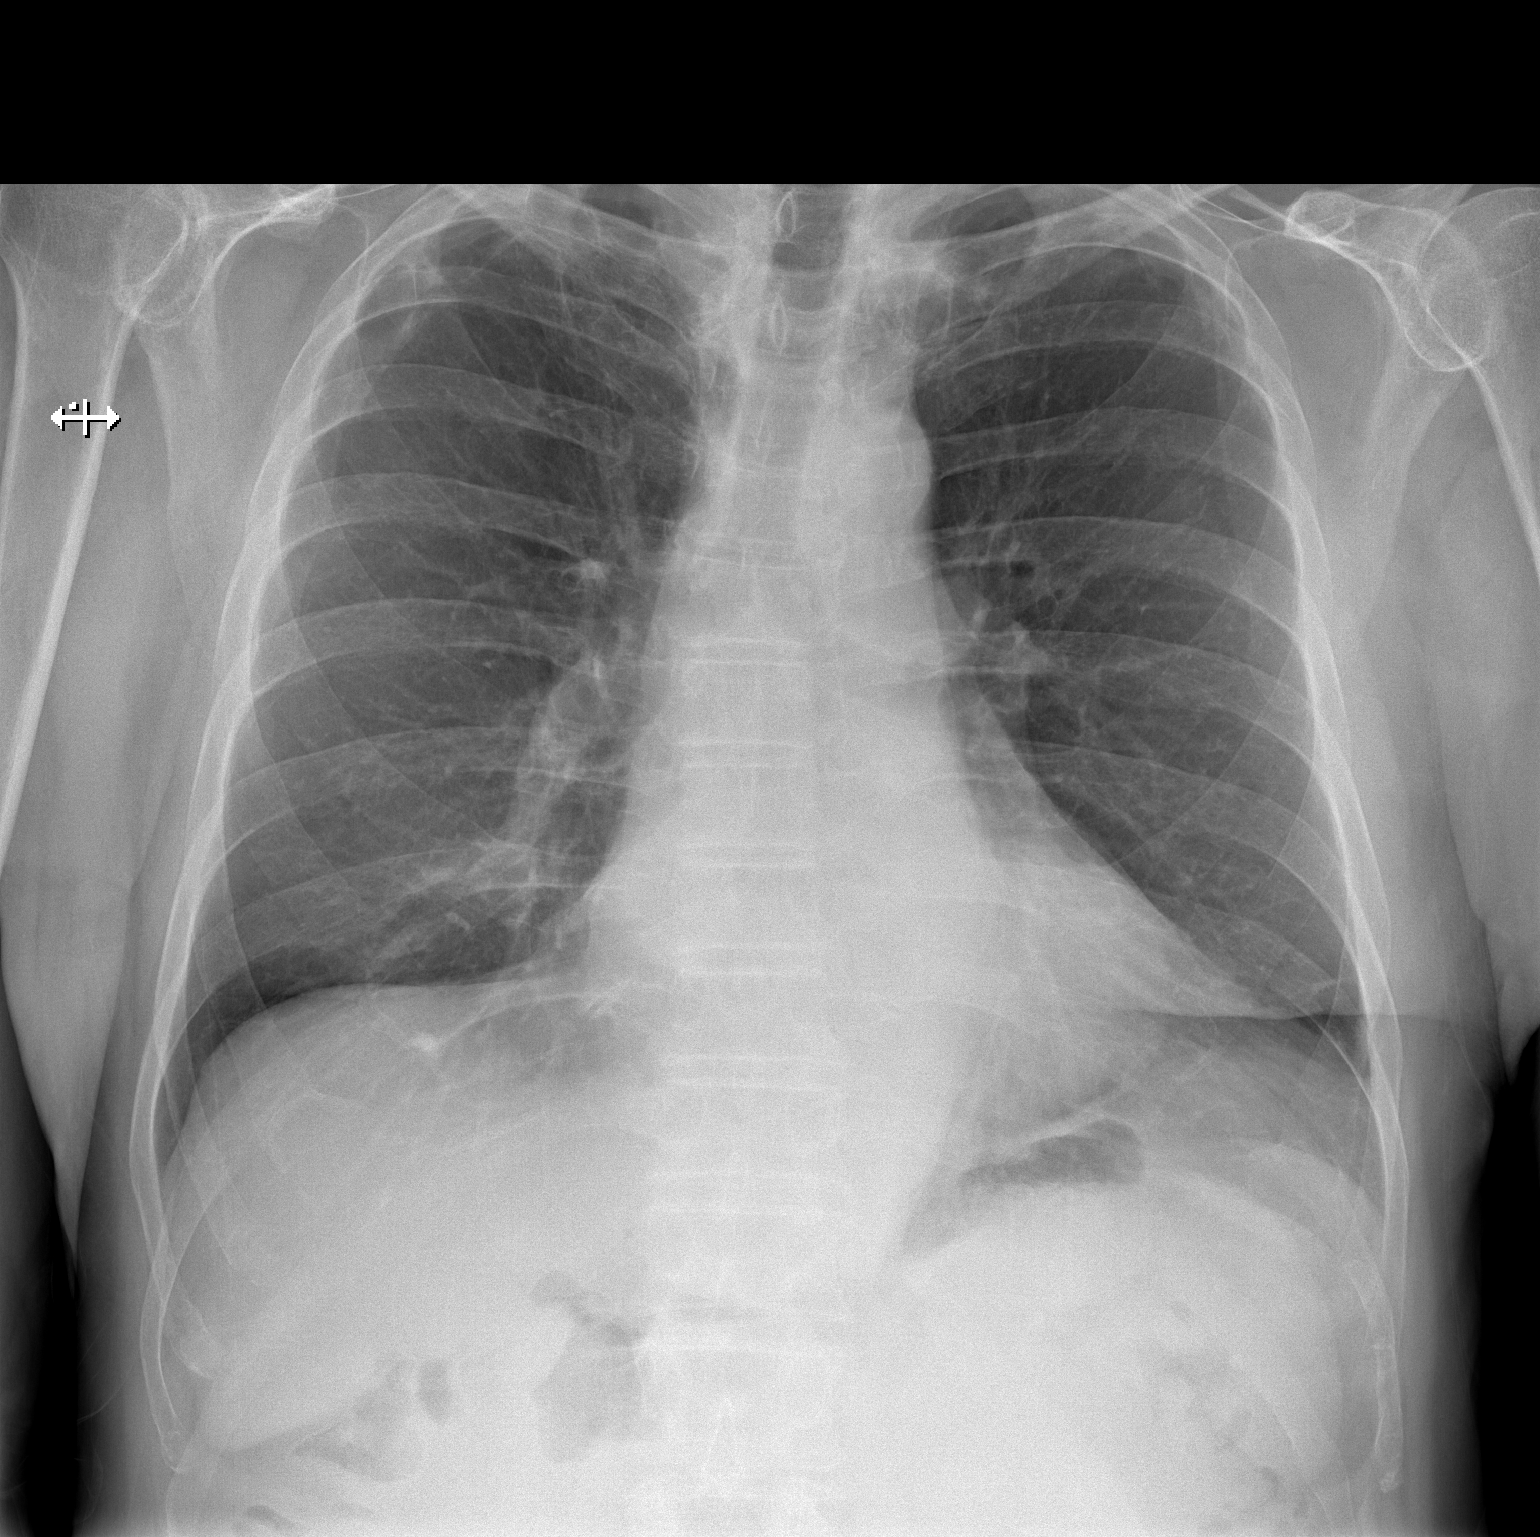

[w chest lat]
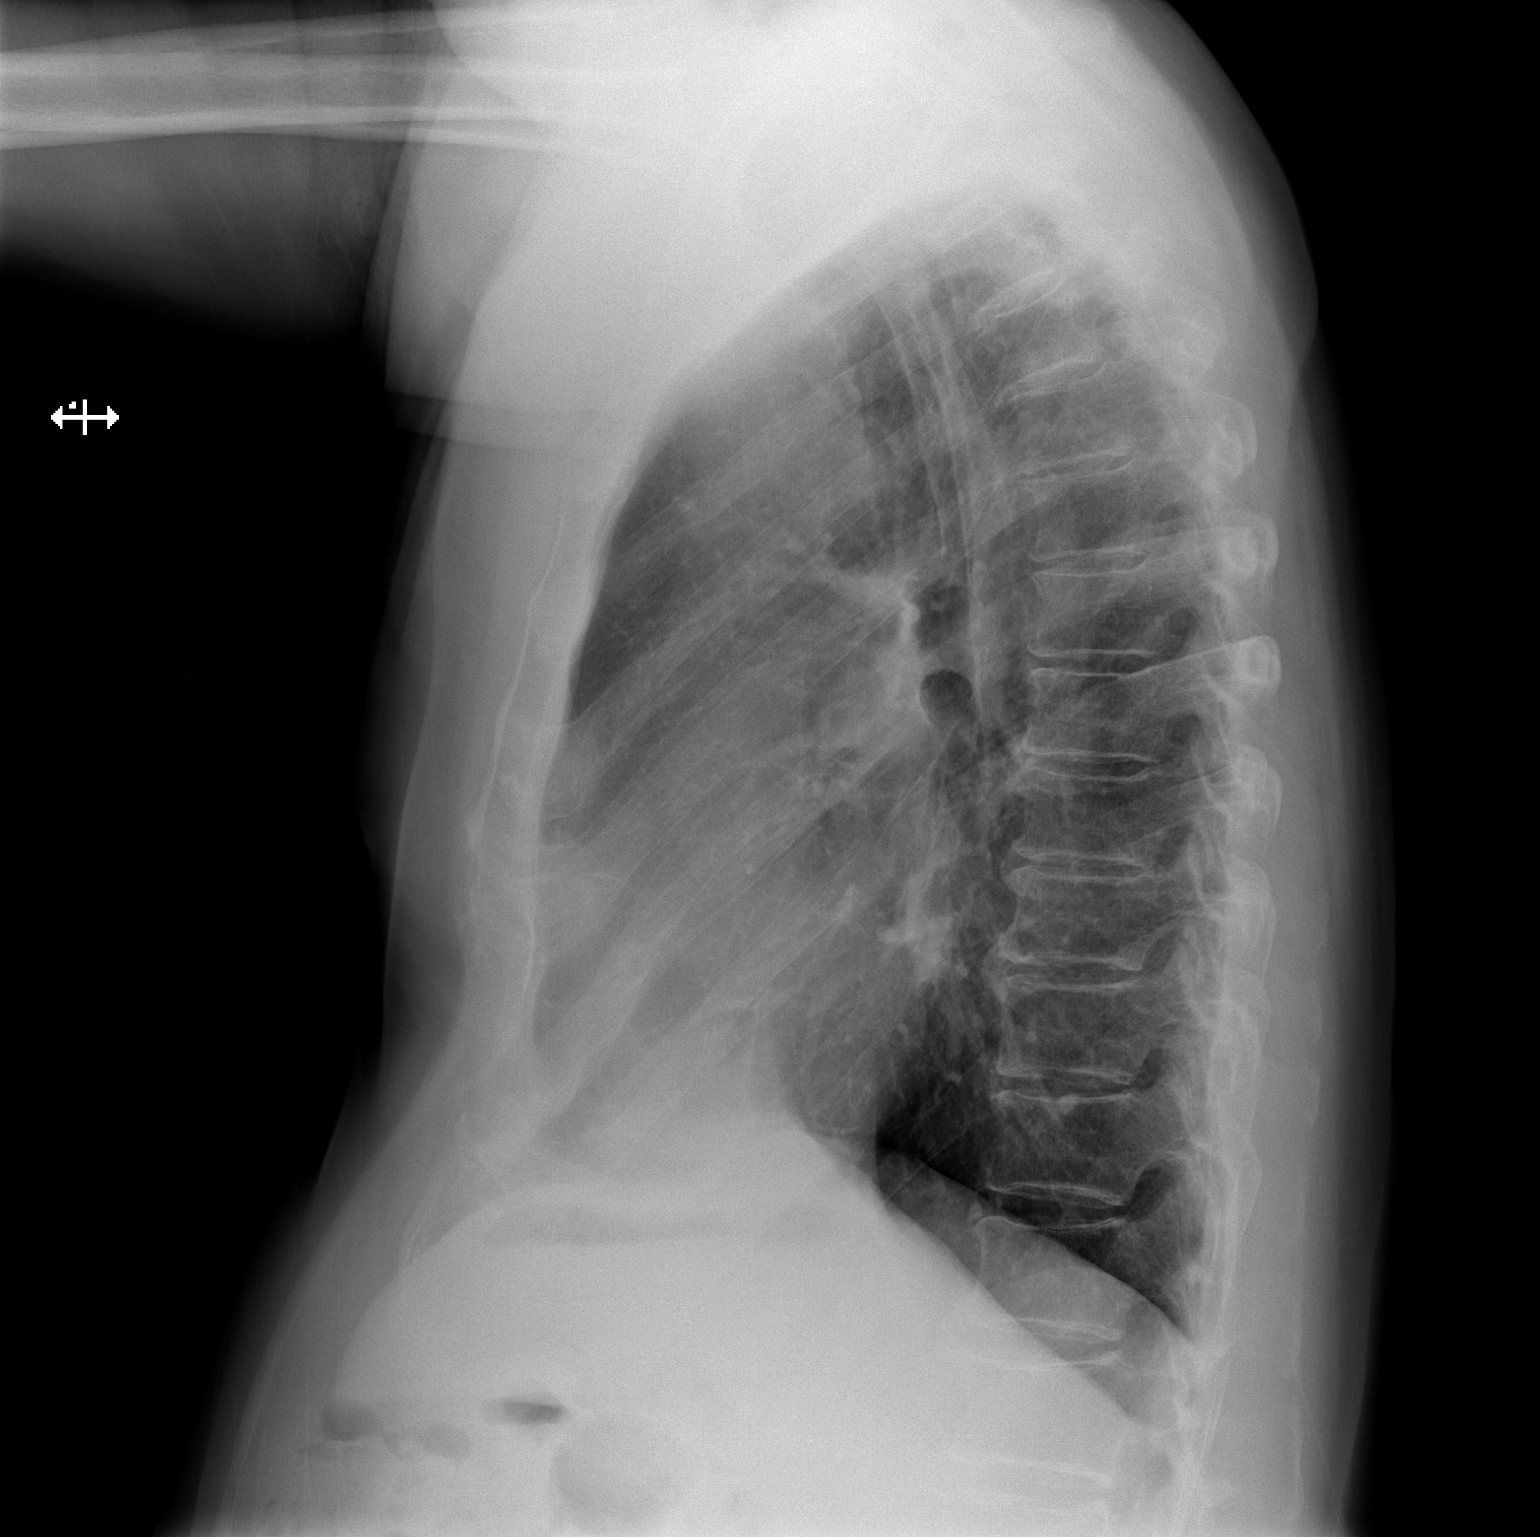

[2 of 2 positions shown; findings below may reference images not displayed]

FINDINGS: Mediastinum hilar structures are normal. Heart size stable.
Bilateral pleural-parenchymal thickening again noted consistent with
scarring. No acute infiltrate. No pleural effusion or pneumothorax.
Degenerative change thoracic spine with thoracic spine scoliosis
again noted .
IMPRESSION: Pleural-parenchymal scarring.  No acute cardiopulmonary disease.

## 2018-07-22 ENCOUNTER — Other Ambulatory Visit: Payer: Self-pay | Admitting: Family Medicine

## 2018-07-22 DIAGNOSIS — N401 Enlarged prostate with lower urinary tract symptoms: Secondary | ICD-10-CM | POA: Diagnosis not present

## 2018-07-22 DIAGNOSIS — R3912 Poor urinary stream: Secondary | ICD-10-CM | POA: Diagnosis not present

## 2018-07-23 ENCOUNTER — Ambulatory Visit (INDEPENDENT_AMBULATORY_CARE_PROVIDER_SITE_OTHER): Payer: Medicare HMO | Admitting: Family Medicine

## 2018-07-23 ENCOUNTER — Telehealth: Payer: Self-pay

## 2018-07-23 VITALS — BP 110/62 | HR 62 | Temp 97.7°F | Resp 18 | Ht 70.0 in | Wt 183.2 lb

## 2018-07-23 DIAGNOSIS — R972 Elevated prostate specific antigen [PSA]: Secondary | ICD-10-CM | POA: Diagnosis not present

## 2018-07-23 DIAGNOSIS — K219 Gastro-esophageal reflux disease without esophagitis: Secondary | ICD-10-CM | POA: Diagnosis not present

## 2018-07-23 DIAGNOSIS — Z23 Encounter for immunization: Secondary | ICD-10-CM | POA: Diagnosis not present

## 2018-07-23 DIAGNOSIS — E785 Hyperlipidemia, unspecified: Secondary | ICD-10-CM | POA: Diagnosis not present

## 2018-07-23 DIAGNOSIS — R05 Cough: Secondary | ICD-10-CM

## 2018-07-23 DIAGNOSIS — Z79899 Other long term (current) drug therapy: Secondary | ICD-10-CM | POA: Diagnosis not present

## 2018-07-23 DIAGNOSIS — I5022 Chronic systolic (congestive) heart failure: Secondary | ICD-10-CM | POA: Diagnosis not present

## 2018-07-23 DIAGNOSIS — I10 Essential (primary) hypertension: Secondary | ICD-10-CM

## 2018-07-23 DIAGNOSIS — R059 Cough, unspecified: Secondary | ICD-10-CM

## 2018-07-23 DIAGNOSIS — Z0000 Encounter for general adult medical examination without abnormal findings: Secondary | ICD-10-CM

## 2018-07-23 DIAGNOSIS — I480 Paroxysmal atrial fibrillation: Secondary | ICD-10-CM | POA: Diagnosis not present

## 2018-07-23 NOTE — Progress Notes (Signed)
Subjective:    Patient ID: Francis Shaw, male    DOB: 07/04/1941, 77 y.o.   MRN: [id number]  No chief complaint on file.   HPI Patient is in today for annual preventative exam and follow-up on chronic medical concerns.  He feels well today although he has had his cough returned again.  He had a course of hydrocodone cough syrup and that helped but now that that is gone the cough is come back.  It is a tickle in his throat and a dry cough.  Is nonproductive he has had no fevers.  He does endorse some allergies but denies heartburn.  No recent febrile illness or hospitalizations.  He reports otherwise he is maintaining a heart healthy diet and staying active.  No difficulties with activities of daily living.  He is following with urology and his PSA is normalized. Denies CP/palp/SOB/HA/congestion/fevers/GI or GU c/o. Taking meds as prescribed  Past Medical History:  Diagnosis Date  . Allergy   . Arthritis    left hip  . Atrial fibrillation ([place])    a. Eliquis initiated 04/2013.  Marland Kitchen Atrial flutter ([place])   . Constipation 07/15/2017  . Dermatitis 11/22/2012  . Esophageal reflux 04/12/2013  . Hypertension   . Nonischemic cardiomyopathy ([place])    a. presumed to be tachy mediated;  b. 03/2013 Echo: EF 30-35%, diff HK, mild LVH, mildly dil LA.  Marland Kitchen Pericarditis   . Plantar wart, right foot 03/26/2017  . Skin lesion 11/26/2011    Past Surgical History:  Procedure Laterality Date  . CALCANEAL OSTEOTOMY Right 1993   "stapled it; screwed it" (04/28/2013)  . CALCANEUS HARDWARE REMOVAL Right ~ 1994  . COLONOSCOPY     10-12 years ago; unable to find records  . COLONOSCOPY    . 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 LEFT HEART CATHETERIZATION WITH CORONARY ANGIOGRAM N/A 09/30/2013   Procedure: LEFT HEART CATHETERIZATION WITH CORONARY ANGIOGRAM;  Surgeon: Burnell Blanks, MD;  Location: [facility] CATH LAB;  Service: Cardiovascular;  Laterality: N/A;  . TONSILLECTOMY  1940's  . TOTAL HIP  ARTHROPLASTY Left 07/28/2017   Procedure: TOTAL HIP ARTHROPLASTY ANTERIOR APPROACH;  Surgeon: Frederik Pear, MD;  Location: [facility];  Service: Orthopedics;  Laterality: Left;    Family History  Problem Relation Age of Onset  . Stroke Father   . Asthma Mother        low back pain  . Colon cancer Neg Hx   . Esophageal cancer Neg Hx   . Rectal cancer Neg Hx   . Stomach cancer Neg Hx     Social History   Socioeconomic History  . Marital status: Married    Spouse name: Not on file  . Number of children: 2  . Years of education: Not on file  . Highest education level: Not on file  Occupational History  . Occupation: self emp  Social Needs  . Financial resource strain: Not on file  . Food insecurity:    Worry: Not on file    Inability: Not on file  . Transportation needs:    Medical: Not on file    Non-medical: Not on file  Tobacco Use  . Smoking status: Former Smoker    Packs/day: 1.00    Years: 18.00    Pack years: 18.00    Types: Cigarettes    Last attempt to quit: 09/24/1979    Years since quitting: 38.8  . Smokeless tobacco: Never Used  Substance and Sexual  Activity  . Alcohol use: Yes    Alcohol/week: 0.0 standard drinks    Comment: occasionally   . Drug use: No  . Sexual activity: Yes    Partners: Female  Lifestyle  . Physical activity:    Days per week: Not on file    Minutes per session: Not on file  . Stress: Not on file  Relationships  . Social connections:    Talks on phone: Not on file    Gets together: Not on file    Attends religious service: Not on file    Active member of club or organization: Not on file    Attends meetings of clubs or organizations: Not on file    Relationship status: Not on file  . Intimate partner violence:    Fear of current or ex partner: Not on file    Emotionally abused: Not on file    Physically abused: Not on file    Forced sexual activity: Not on file  Other Topics Concern  . Not on file  Social History Narrative  .  Not on file    Outpatient Medications Prior to Visit  Medication Sig Dispense Refill  . carvedilol (COREG) 25 MG tablet Take 1 tablet (25 mg total) by mouth 2 (two) times daily. 180 tablet 3  . cetirizine (ZYRTEC) 10 MG tablet Take 1 tablet (10 mg total) by mouth daily as needed for allergies. 30 tablet 1  . clobetasol cream (TEMOVATE) 8.67 % Apply 1 application topically 2 (two) times daily as needed (anal itch). Reported on 11/07/2015 30 g 0  . hydrOXYzine (ATARAX/VISTARIL) 25 MG tablet TAKE 1-2 TABLETS (25-50 MG TOTAL) BY MOUTH AT BEDTIME AS NEEDED. 30 tablet 1  . losartan (COZAAR) 25 MG tablet TAKE 1 TABLET BY MOUTH EVERY DAY 90 tablet 1  . Multiple Vitamins-Minerals (CENTRUM SILVER PO) Take 1 tablet by mouth daily.    Marland Kitchen oxyCODONE-acetaminophen (ROXICET) 5-325 MG tablet Take 1 tablet every 4 (four) hours as needed by mouth. 60 tablet 0  . polyethylene glycol (MIRALAX) packet Take 17 g by mouth daily. 30 each 1  . rivaroxaban (XARELTO) 20 MG TABS tablet TAKE 1 TABLET BY MOUTH EVERY DAY WITH DINNER 90 tablet 0  . tamsulosin (FLOMAX) 0.4 MG CAPS capsule Take 0.4 mg by mouth daily.  11  . tiZANidine (ZANAFLEX) 2 MG tablet Take 1 tablet (2 mg total) every 6 (six) hours as needed by mouth for muscle spasms. 60 tablet 0  . traMADol (ULTRAM) 50 MG tablet Take 1 tablet (50 mg total) by mouth 2 (two) times daily as needed for moderate pain. 30 tablet 0  . Wheat Dextrin (BENEFIBER PO) Take 1 scoop by mouth daily.    Marland Kitchen zolpidem (AMBIEN) 10 MG tablet Take 0.5-1 tablets (5-10 mg total) by mouth at bedtime as needed for sleep. 15 tablet 1   No facility-administered medications prior to visit.     No Known Allergies  Review of Systems  Constitutional: Negative for chills, fever and malaise/fatigue.  HENT: Negative for congestion and hearing loss.   Eyes: Negative for discharge.  Respiratory: Positive for cough. Negative for sputum production and shortness of breath.   Cardiovascular: Negative for  chest pain, palpitations and leg swelling.  Gastrointestinal: Negative for abdominal pain, blood in stool, constipation, diarrhea, heartburn, nausea and vomiting.  Genitourinary: Negative for dysuria, frequency, hematuria and urgency.  Musculoskeletal: Negative for back pain, falls and myalgias.  Skin: Negative for rash.  Neurological: Negative for dizziness, sensory  change, loss of consciousness, weakness and headaches.  Endo/Heme/Allergies: Negative for environmental allergies. Does not bruise/bleed easily.  Psychiatric/Behavioral: Negative for depression and suicidal ideas. The patient is not nervous/anxious and does not have insomnia.        Objective:    Physical Exam  Constitutional: He is oriented to person, place, and time. He appears well-developed and well-nourished. No distress.  HENT:  Head: Normocephalic and atraumatic.  Right Ear: External ear normal.  Left Ear: External ear normal.  Nose: Nose normal.  Eyes: Pupils are equal, round, and reactive to light. Conjunctivae and EOM are normal. Right eye exhibits no discharge. Left eye exhibits no discharge.  Neck: Normal range of motion. Neck supple.  Cardiovascular: Normal rate and regular rhythm.  No murmur heard. Pulmonary/Chest: Effort normal and breath sounds normal.  Abdominal: Soft. Bowel sounds are normal. He exhibits no distension and no mass. There is no tenderness. There is no guarding. No hernia.  Musculoskeletal: He exhibits no edema or deformity.  Neurological: He is alert and oriented to person, place, and time. He displays normal reflexes. No cranial nerve deficit. He exhibits normal muscle tone. Coordination normal.  Skin: Skin is warm.  Psychiatric: He has a normal mood and affect.  Nursing note and vitals reviewed.   BP 110/62 (BP Location: Left Arm, Patient Position: Sitting, Cuff Size: Normal)   Pulse 62   Temp 97.7 F (36.5 C) (Oral)   Resp 18   Ht 5\' 10"  (1.778 m)   Wt 183 lb 3.2 oz (83.1 kg)    SpO2 96%   BMI 26.29 kg/m  Wt Readings from Last 3 Encounters:  07/23/18 183 lb 3.2 oz (83.1 kg)  06/08/18 186 lb 6.4 oz (84.6 kg)  06/03/18 188 lb (85.3 kg)     Lab Results  Component Value Date   WBC 5.6 06/03/2018   HGB 13.2 06/03/2018   HCT 39.1 06/03/2018   PLT 175 06/03/2018   GLUCOSE 114 (H) 06/03/2018   CHOL 151 01/20/2018   TRIG 96.0 01/20/2018   HDL 44.20 01/20/2018   LDLCALC 87 01/20/2018   ALT 11 01/20/2018   AST 14 01/20/2018   NA 143 06/03/2018   K 4.5 06/03/2018   CL 104 06/03/2018   CREATININE 0.85 06/03/2018   BUN 14 06/03/2018   CO2 24 06/03/2018   TSH 1.29 01/20/2018   PSA 4.20 (H) 01/20/2018   INR 1.08 07/28/2017   HGBA1C 5.3 01/20/2018    Lab Results  Component Value Date   TSH 1.29 01/20/2018   Lab Results  Component Value Date   WBC 5.6 06/03/2018   HGB 13.2 06/03/2018   HCT 39.1 06/03/2018   MCV 89 06/03/2018   PLT 175 06/03/2018   Lab Results  Component Value Date   NA 143 06/03/2018   K 4.5 06/03/2018   CO2 24 06/03/2018   GLUCOSE 114 (H) 06/03/2018   BUN 14 06/03/2018   CREATININE 0.85 06/03/2018   BILITOT 0.6 01/20/2018   ALKPHOS 76 01/20/2018   AST 14 01/20/2018   ALT 11 01/20/2018   PROT 6.2 01/20/2018   ALBUMIN 3.7 01/20/2018   CALCIUM 9.0 06/03/2018   ANIONGAP 7 07/29/2017   GFR 98.62 01/20/2018   Lab Results  Component Value Date   CHOL 151 01/20/2018   Lab Results  Component Value Date   HDL 44.20 01/20/2018   Lab Results  Component Value Date   LDLCALC 87 01/20/2018   Lab Results  Component Value Date  TRIG 96.0 01/20/2018   Lab Results  Component Value Date   CHOLHDL 3 01/20/2018   Lab Results  Component Value Date   HGBA1C 5.3 01/20/2018       Assessment & Plan:   Problem List Items Addressed This Visit    Preventative health care    Patient encouraged to maintain heart healthy diet, regular exercise, adequate sleep. Consider daily probiotics. Take medications as prescribed       Hyperlipidemia    Encouraged heart healthy diet, increase exercise, avoid trans fats, consider a krill oil cap daily      Relevant Orders   Lipid panel   Esophageal reflux    Avoid offending foods, start probiotics. Do not eat large meals in late evening and consider raising head of bed.       Atrial fibrillation (HCC)    RRR today tolerating Eliquis      Cough    Start the Famotidine 40 mg qhs and loratadine 10 mg q am. Referred to ENT for further evaluation.       Chronic systolic HF (heart failure) (HCC)    asymptomatic      Elevated PSA    Follows with urology.       HTN (hypertension)    Well controlled, no changes to meds. Encouraged heart healthy diet such as the DASH diet and exercise as tolerated.       Relevant Orders   CBC   Comprehensive metabolic panel   TSH    Other Visit Diagnoses    High risk medication use    -  Primary   Relevant Orders   Pain Mgmt, Profile 8 w/Conf, U      I have discontinued Shanon Brow L. Bruhl's zolpidem. I am also having him maintain his Multiple Vitamins-Minerals (CENTRUM SILVER PO), cetirizine, clobetasol cream, polyethylene glycol, Wheat Dextrin (BENEFIBER PO), traMADol, tiZANidine, oxyCODONE-acetaminophen, rivaroxaban, losartan, tamsulosin, carvedilol, and hydrOXYzine.  No orders of the defined types were placed in this encounter.    Penni Homans, MD

## 2018-07-23 NOTE — Assessment & Plan Note (Signed)
Patient encouraged to maintain heart healthy diet, regular exercise, adequate sleep. Consider daily probiotics. Take medications as prescribed 

## 2018-07-23 NOTE — Assessment & Plan Note (Signed)
asymptomatic

## 2018-07-23 NOTE — Assessment & Plan Note (Signed)
Avoid offending foods, start probiotics. Do not eat large meals in late evening and consider raising head of bed.  

## 2018-07-23 NOTE — Assessment & Plan Note (Signed)
Encouraged heart healthy diet, increase exercise, avoid trans fats, consider a krill oil cap daily 

## 2018-07-23 NOTE — Assessment & Plan Note (Signed)
Start the Famotidine 40 mg qhs and loratadine 10 mg q am. Referred to ENT for further evaluation.

## 2018-07-23 NOTE — Assessment & Plan Note (Signed)
Well controlled, no changes to meds. Encouraged heart healthy diet such as the DASH diet and exercise as tolerated.  °

## 2018-07-23 NOTE — Telephone Encounter (Signed)
PA initiated via Covermymeds; KEY: [id number]. Awaiting determination.

## 2018-07-23 NOTE — Patient Instructions (Signed)
Preventive Care 65 Years and Older, Male Preventive care refers to lifestyle choices and visits with your health care provider that can promote health and wellness. What does preventive care include?  A yearly physical exam. This is also called an annual well check.  Dental exams once or twice a year.  Routine eye exams. Ask your health care provider how often you should have your eyes checked.  Personal lifestyle choices, including: ? Daily care of your teeth and gums. ? Regular physical activity. ? Eating a healthy diet. ? Avoiding tobacco and drug use. ? Limiting alcohol use. ? Practicing safe sex. ? Taking low doses of aspirin every day. ? Taking vitamin and mineral supplements as recommended by your health care provider. What happens during an annual well check? The services and screenings done by your health care provider during your annual well check will depend on your age, overall health, lifestyle risk factors, and family history of disease. Counseling Your health care provider may ask you questions about your:  Alcohol use.  Tobacco use.  Drug use.  Emotional well-being.  Home and relationship well-being.  Sexual activity.  Eating habits.  History of falls.  Memory and ability to understand (cognition).  Work and work environment.  Screening You may have the following tests or measurements:  Height, weight, and BMI.  Blood pressure.  Lipid and cholesterol levels. These may be checked every 5 years, or more frequently if you are over 50 years old.  Skin check.  Lung cancer screening. You may have this screening every year starting at age 55 if you have a 30-pack-year history of smoking and currently smoke or have quit within the past 15 years.  Fecal occult blood test (FOBT) of the stool. You may have this test every year starting at age 50.  Flexible sigmoidoscopy or colonoscopy. You may have a sigmoidoscopy every 5 years or a colonoscopy every 10  years starting at age 50.  Prostate cancer screening. Recommendations will vary depending on your family history and other risks.  Hepatitis C blood test.  Hepatitis B blood test.  Sexually transmitted disease (STD) testing.  Diabetes screening. This is done by checking your blood sugar (glucose) after you have not eaten for a while (fasting). You may have this done every 1-3 years.  Abdominal aortic aneurysm (AAA) screening. You may need this if you are a current or former smoker.  Osteoporosis. You may be screened starting at age 70 if you are at high risk.  Talk with your health care provider about your test results, treatment options, and if necessary, the need for more tests. Vaccines Your health care provider may recommend certain vaccines, such as:  Influenza vaccine. This is recommended every year.  Tetanus, diphtheria, and acellular pertussis (Tdap, Td) vaccine. You may need a Td booster every 10 years.  Varicella vaccine. You may need this if you have not been vaccinated.  Zoster vaccine. You may need this after age 60.  Measles, mumps, and rubella (MMR) vaccine. You may need at least one dose of MMR if you were born in 1957 or later. You may also need a second dose.  Pneumococcal 13-valent conjugate (PCV13) vaccine. One dose is recommended after age 76.  Pneumococcal polysaccharide (PPSV23) vaccine. One dose is recommended after age 76.  Meningococcal vaccine. You may need this if you have certain conditions.  Hepatitis A vaccine. You may need this if you have certain conditions or if you travel or work in places where you   may be exposed to hepatitis A.  Hepatitis B vaccine. You may need this if you have certain conditions or if you travel or work in places where you may be exposed to hepatitis B.  Haemophilus influenzae type b (Hib) vaccine. You may need this if you have certain risk factors.  Talk to your health care provider about which screenings and vaccines  you need and how often you need them. This information is not intended to replace advice given to you by your health care provider. Make sure you discuss any questions you have with your health care provider. Document Released: 10/06/2015 Document Revised: 05/29/2016 Document Reviewed: 07/11/2015 Elsevier Interactive Patient Education  2018 [employer]  

## 2018-07-23 NOTE — Assessment & Plan Note (Addendum)
RRR today tolerating Eliquis

## 2018-07-23 NOTE — Assessment & Plan Note (Signed)
Follows with urology

## 2018-07-24 ENCOUNTER — Encounter: Payer: Self-pay | Admitting: Family Medicine

## 2018-07-24 LAB — CBC
HEMATOCRIT: 44.5 % (ref 39.0–52.0)
Hemoglobin: 15.4 g/dL (ref 13.0–17.0)
MCHC: 34.6 g/dL (ref 30.0–36.0)
MCV: 88.6 fl (ref 78.0–100.0)
PLATELETS: 207 10*3/uL (ref 150.0–400.0)
RBC: 5.02 Mil/uL (ref 4.22–5.81)
RDW: 12.9 % (ref 11.5–15.5)
WBC: 7.9 10*3/uL (ref 4.0–10.5)

## 2018-07-24 LAB — COMPREHENSIVE METABOLIC PANEL
ALBUMIN: 4.2 g/dL (ref 3.5–5.2)
ALT: 11 U/L (ref 0–53)
AST: 15 U/L (ref 0–37)
Alkaline Phosphatase: 82 U/L (ref 39–117)
BUN: 13 mg/dL (ref 6–23)
CALCIUM: 9.7 mg/dL (ref 8.4–10.5)
CHLORIDE: 106 meq/L (ref 96–112)
CO2: 27 mEq/L (ref 19–32)
CREATININE: 0.91 mg/dL (ref 0.40–1.50)
GFR: 86.1 mL/min (ref >60.00)
Glucose, Bld: 82 mg/dL (ref 70–99)
POTASSIUM: 4.2 meq/L (ref 3.5–5.1)
Sodium: 142 mEq/L (ref 135–145)
Total Bilirubin: 0.7 mg/dL (ref 0.2–1.2)
Total Protein: 6.4 g/dL (ref 6.0–8.3)

## 2018-07-24 LAB — LIPID PANEL
Cholesterol: 172 mg/dL (ref 0–200)
HDL: 41 mg/dL (ref >39.00)
LDL Cholesterol: 91 mg/dL (ref 0–99)
NonHDL: 130.68
Total CHOL/HDL Ratio: 4
Triglycerides: 199 mg/dL — ABNORMAL HIGH (ref 0.0–149.0)
VLDL: 39.8 mg/dL (ref 0.0–40.0)

## 2018-07-24 LAB — TSH: TSH: 1.83 u[IU]/mL (ref 0.35–4.50)

## 2018-07-24 NOTE — Telephone Encounter (Signed)
PA approved. Effective 09/21/2017 to 09/23/2019.

## 2018-07-26 ENCOUNTER — Other Ambulatory Visit: Payer: Self-pay | Admitting: Family Medicine

## 2018-07-26 MED ORDER — FAMOTIDINE 40 MG PO TABS: 40.0000 mg | ORAL_TABLET | Freq: Every day | ORAL | 5 refills | Status: DC

## 2018-07-27 ENCOUNTER — Other Ambulatory Visit: Payer: Self-pay | Admitting: Family Medicine

## 2018-07-27 DIAGNOSIS — R05 Cough: Secondary | ICD-10-CM

## 2018-07-27 DIAGNOSIS — R059 Cough, unspecified: Secondary | ICD-10-CM

## 2018-07-27 MED ORDER — CETIRIZINE HCL 10 MG PO TABS: 10.0000 mg | ORAL_TABLET | Freq: Every day | ORAL | 5 refills | Status: DC | PRN

## 2018-07-28 ENCOUNTER — Encounter: Payer: Self-pay | Admitting: Family Medicine

## 2018-07-28 LAB — PAIN MGMT, PROFILE 8 W/CONF, U
6 ACETYLMORPHINE: NEGATIVE ng/mL (ref <10)
Alcohol Metabolites: POSITIVE ng/mL — AB (ref <500)
Amphetamines: NEGATIVE ng/mL (ref <500)
BENZODIAZEPINES: NEGATIVE ng/mL (ref <100)
Buprenorphine, Urine: NEGATIVE ng/mL (ref <5)
Cocaine Metabolite: NEGATIVE ng/mL (ref <150)
Creatinine: 119.9 mg/dL
ETHYL GLUCURONIDE (ETG): 3219 ng/mL — AB (ref <500)
Ethyl Sulfate (ETS): 548 ng/mL — ABNORMAL HIGH (ref <100)
MDMA: NEGATIVE ng/mL (ref <500)
Marijuana Metabolite: NEGATIVE ng/mL (ref <20)
OPIATES: NEGATIVE ng/mL (ref <100)
OXYCODONE: NEGATIVE ng/mL (ref <100)
Oxidant: NEGATIVE ug/mL (ref <200)
pH: 6.1 (ref 4.5–9.0)

## 2018-07-28 NOTE — Progress Notes (Signed)
fyi updated

## 2018-08-03 ENCOUNTER — Encounter: Payer: Self-pay | Admitting: Family Medicine

## 2018-08-03 ENCOUNTER — Other Ambulatory Visit: Payer: Self-pay | Admitting: Family Medicine

## 2018-08-03 MED ORDER — CEFDINIR 300 MG PO CAPS: 300.0000 mg | ORAL_CAPSULE | Freq: Two times a day (BID) | ORAL | 0 refills | Status: AC

## 2018-08-21 ENCOUNTER — Other Ambulatory Visit: Payer: Self-pay | Admitting: Family Medicine

## 2018-08-22 ENCOUNTER — Other Ambulatory Visit: Payer: Self-pay | Admitting: Cardiology

## 2018-08-22 DIAGNOSIS — I428 Other cardiomyopathies: Secondary | ICD-10-CM

## 2018-10-27 ENCOUNTER — Other Ambulatory Visit: Payer: Self-pay | Admitting: Cardiology

## 2018-11-03 DIAGNOSIS — H02105 Unspecified ectropion of left lower eyelid: Secondary | ICD-10-CM | POA: Diagnosis not present

## 2018-11-03 DIAGNOSIS — H5203 Hypermetropia, bilateral: Secondary | ICD-10-CM | POA: Diagnosis not present

## 2018-11-03 DIAGNOSIS — H02104 Unspecified ectropion of left upper eyelid: Secondary | ICD-10-CM | POA: Diagnosis not present

## 2018-12-28 ENCOUNTER — Encounter: Payer: Self-pay | Admitting: Family Medicine

## 2018-12-29 ENCOUNTER — Other Ambulatory Visit: Payer: Self-pay

## 2018-12-29 ENCOUNTER — Ambulatory Visit (INDEPENDENT_AMBULATORY_CARE_PROVIDER_SITE_OTHER): Payer: Medicare HMO | Admitting: Family Medicine

## 2018-12-29 DIAGNOSIS — J301 Allergic rhinitis due to pollen: Secondary | ICD-10-CM | POA: Diagnosis not present

## 2018-12-29 DIAGNOSIS — R972 Elevated prostate specific antigen [PSA]: Secondary | ICD-10-CM | POA: Diagnosis not present

## 2018-12-29 DIAGNOSIS — I10 Essential (primary) hypertension: Secondary | ICD-10-CM | POA: Diagnosis not present

## 2018-12-29 MED ORDER — LORATADINE 10 MG PO TABS: 10.0000 mg | ORAL_TABLET | Freq: Two times a day (BID) | ORAL | 5 refills | Status: DC | PRN

## 2018-12-29 MED ORDER — FLUTICASONE PROPIONATE 50 MCG/ACT NA SUSP: 2.0000 | Freq: Every day | NASAL | 6 refills | Status: DC

## 2018-12-29 MED ORDER — MONTELUKAST SODIUM 10 MG PO TABS: 10.0000 mg | ORAL_TABLET | Freq: Every evening | ORAL | 3 refills | Status: DC | PRN

## 2018-12-29 NOTE — Telephone Encounter (Signed)
Scheduled virtual visit °

## 2018-12-29 NOTE — Assessment & Plan Note (Addendum)
Was seen [facility] and started on Flomax no new concerns he will follow up with them annually

## 2018-12-29 NOTE — Assessment & Plan Note (Signed)
Bad spring allergies this year with significant itchy watery eyes. Only taking Mucinex, start flonase, claritin bid and can add Singulair.

## 2018-12-29 NOTE — Progress Notes (Signed)
Virtual Visit via Video Note  I connected with Francis Shaw on 12/29/18 at  9:40 AM EDT by a video enabled telemedicine application and verified that I am speaking with the correct person using two identifiers.   I discussed the limitations of evaluation and management by telemedicine and the availability of in person appointments. The patient expressed understanding and agreed to proceed. Magdalene Molly, CMA able to get patient set up on video platform    Subjective:    Patient ID: Francis Shaw, male    DOB: [birth year], 78 y.o.   MRN: [id number]  No chief complaint on file.   HPI Patient is in today for evaluation of seasonal allergies. They are worse than usual and so far he has only tried Mucinex. Most notable symptom is watery and itchy eyes. No other acute concerns. Does not endorse polyuria or polydipsia. Denies CP/palp/SOB/HA/fevers/GI or GU c/o. Taking meds as prescribed  Past Medical History:  Diagnosis Date  . Allergy   . Arthritis    left hip  . Atrial fibrillation ([place])    a. Eliquis initiated 04/2013.  Marland Kitchen Atrial flutter ([place])   . Constipation 07/15/2017  . Dermatitis 11/22/2012  . Esophageal reflux 04/12/2013  . Hypertension   . Nonischemic cardiomyopathy ([place])    a. presumed to be tachy mediated;  b. 03/2013 Echo: EF 30-35%, diff HK, mild LVH, mildly dil LA.  Marland Kitchen Pericarditis   . Plantar wart, right foot 03/26/2017  . Skin lesion 11/26/2011    Past Surgical History:  Procedure Laterality Date  . CALCANEAL OSTEOTOMY Right 1993   "stapled it; screwed it" (04/28/2013)  . CALCANEUS HARDWARE REMOVAL Right ~ 1994  . COLONOSCOPY     10-12 years ago; unable to find records  . COLONOSCOPY    . 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 LEFT HEART CATHETERIZATION WITH CORONARY ANGIOGRAM N/A 09/30/2013   Procedure: LEFT HEART CATHETERIZATION WITH CORONARY ANGIOGRAM;  Surgeon: Burnell Blanks, MD;  Location: [facility] CATH LAB;  Service: Cardiovascular;  Laterality: N/A;   . TONSILLECTOMY  1940's  . 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 Stroke Father   . Asthma Mother        low back pain  . Colon cancer Neg Hx   . Esophageal cancer Neg Hx   . Rectal cancer Neg Hx   . Stomach cancer Neg Hx     Social History   Socioeconomic History  . Marital status: Married    Spouse name: Not on file  . Number of children: 2  . Years of education: Not on file  . Highest education level: Not on file  Occupational History  . Occupation: self emp  Social Needs  . Financial resource strain: Not on file  . Food insecurity:    Worry: Not on file    Inability: Not on file  . Transportation needs:    Medical: Not on file    Non-medical: Not on file  Tobacco Use  . Smoking status: Former Smoker    Packs/day: 1.00    Years: 18.00    Pack years: 18.00    Types: Cigarettes    Last attempt to quit: 09/24/1979    Years since quitting: 39.2  . Smokeless tobacco: Never Used  Substance and Sexual  Activity  . Alcohol use: Yes    Alcohol/week: 0.0 standard drinks    Comment: occasionally   . Drug use: No  . Sexual activity: Yes    Partners: Female  Lifestyle  . Physical activity:    Days per week: Not on file    Minutes per session: Not on file  . Stress: Not on file  Relationships  . Social connections:    Talks on phone: Not on file    Gets together: Not on file    Attends religious service: Not on file    Active member of club or organization: Not on file    Attends meetings of clubs or organizations: Not on file    Relationship status: Not on file  . Intimate partner violence:    Fear of current or ex partner: Not on file    Emotionally abused: Not on file    Physically abused: Not on file    Forced sexual activity: Not on file  Other Topics Concern  . Not  on file  Social History Narrative  . Not on file    Outpatient Medications Prior to Visit  Medication Sig Dispense Refill  . carvedilol (COREG) 25 MG tablet Take 1 tablet (25 mg total) by mouth 2 (two) times daily. 180 tablet 3  . carvedilol (COREG) 25 MG tablet Take 1 tablet (25 mg total) by mouth 2 (two) times daily with a meal. 180 tablet 3  . clobetasol cream (TEMOVATE) 9.39 % Apply 1 application topically 2 (two) times daily as needed (anal itch). Reported on 11/07/2015 30 g 0  . famotidine (PEPCID) 40 MG tablet Take 1 tablet (40 mg total) by mouth daily. 40 tablet 5  . hydrOXYzine (ATARAX/VISTARIL) 25 MG tablet TAKE 1-2 TABLETS (25-50 MG TOTAL) BY MOUTH AT BEDTIME AS NEEDED. 30 tablet 1  . losartan (COZAAR) 25 MG tablet TAKE 1 TABLET BY MOUTH EVERY DAY 90 tablet 3  . Multiple Vitamins-Minerals (CENTRUM SILVER PO) Take 1 tablet by mouth daily.    Marland Kitchen oxyCODONE-acetaminophen (ROXICET) 5-325 MG tablet Take 1 tablet every 4 (four) hours as needed by mouth. 60 tablet 0  . polyethylene glycol (MIRALAX) packet Take 17 g by mouth daily. 30 each 1  . rivaroxaban (XARELTO) 20 MG TABS tablet Take 1 tablet (20 mg total) by mouth daily with supper. 30 tablet 6  . tamsulosin (FLOMAX) 0.4 MG CAPS capsule Take 0.4 mg by mouth daily.  11  . tiZANidine (ZANAFLEX) 2 MG tablet Take 1 tablet (2 mg total) every 6 (six) hours as needed by mouth for muscle spasms. 60 tablet 0  . traMADol (ULTRAM) 50 MG tablet Take 1 tablet (50 mg total) by mouth 2 (two) times daily as needed for moderate pain. 30 tablet 0  . [place] Dextrin (BENEFIBER PO) Take 1 scoop by mouth daily.    . cetirizine (ZYRTEC) 10 MG tablet Take 1 tablet (10 mg total) by mouth daily as needed for allergies. 30 tablet 5   No facility-administered medications prior to visit.     No Known Allergies  Review of Systems  Constitutional: Negative for fever and malaise/fatigue.  HENT: Positive for congestion. Negative for sinus pain.   Eyes: Positive  for discharge. Negative for blurred vision.  Respiratory: Negative for shortness of breath.   Cardiovascular: Negative for chest pain, palpitations and leg swelling.  Gastrointestinal: Negative for abdominal pain, blood in stool and nausea.  Genitourinary: Negative for dysuria and frequency.  Musculoskeletal: Negative for falls.  Skin: Negative for rash.  Neurological: Negative for dizziness, loss of consciousness and headaches.  Endo/Heme/Allergies: Negative for environmental allergies.  Psychiatric/Behavioral: Negative for depression. The patient is not nervous/anxious.        Objective:    Physical Exam Vitals signs reviewed.  Constitutional:      Appearance: Normal appearance.  HENT:     Head: Normocephalic and atraumatic.     Nose: Nose normal.  Pulmonary:     Effort: Pulmonary effort is normal.  Skin:    General: Skin is dry.  Neurological:     General: No focal deficit present.     Mental Status: He is alert and oriented to person, place, and time.  Psychiatric:        Mood and Affect: Mood normal.        Behavior: Behavior normal.     There were no vitals taken for this visit. Wt Readings from Last 3 Encounters:  07/23/18 183 lb 3.2 oz (83.1 kg)  06/08/18 186 lb 6.4 oz (84.6 kg)  06/03/18 188 lb (85.3 kg)    Diabetic Foot Exam - Simple   No data filed     Lab Results  Component Value Date   WBC 7.9 07/23/2018   HGB 15.4 07/23/2018   HCT 44.5 07/23/2018   PLT 207.0 07/23/2018   GLUCOSE 82 07/23/2018   CHOL 172 07/23/2018   TRIG 199.0 (H) 07/23/2018   HDL 41.00 07/23/2018   LDLCALC 91 07/23/2018   ALT 11 07/23/2018   AST 15 07/23/2018   NA 142 07/23/2018   K 4.2 07/23/2018   CL 106 07/23/2018   CREATININE 0.91 07/23/2018   BUN 13 07/23/2018   CO2 27 07/23/2018   TSH 1.83 07/23/2018   PSA 4.20 (H) 01/20/2018   INR 1.08 07/28/2017   HGBA1C 5.3 01/20/2018    Lab Results  Component Value Date   TSH 1.83 07/23/2018   Lab Results  Component  Value Date   WBC 7.9 07/23/2018   HGB 15.4 07/23/2018   HCT 44.5 07/23/2018   MCV 88.6 07/23/2018   PLT 207.0 07/23/2018   Lab Results  Component Value Date   NA 142 07/23/2018   K 4.2 07/23/2018   CO2 27 07/23/2018   GLUCOSE 82 07/23/2018   BUN 13 07/23/2018   CREATININE 0.91 07/23/2018   BILITOT 0.7 07/23/2018   ALKPHOS 82 07/23/2018   AST 15 07/23/2018   ALT 11 07/23/2018   PROT 6.4 07/23/2018   ALBUMIN 4.2 07/23/2018   CALCIUM 9.7 07/23/2018   ANIONGAP 7 07/29/2017   GFR 86.10 07/23/2018   Lab Results  Component Value Date   CHOL 172 07/23/2018   Lab Results  Component Value Date   HDL 41.00 07/23/2018   Lab Results  Component Value Date   LDLCALC 91 07/23/2018   Lab Results  Component Value Date   TRIG 199.0 (H) 07/23/2018   Lab Results  Component Value Date   CHOLHDL 4 07/23/2018   Lab Results  Component Value Date   HGBA1C 5.3 01/20/2018       Assessment & Plan:   Problem List Items Addressed This Visit    Allergic rhinitis    Bad spring allergies this year with significant itchy watery eyes. Only taking Mucinex, start flonase, claritin bid and can add Singulair.       Elevated PSA    Was seen [facility] and started on Flomax no new concerns he will follow up with them annually  HTN (hypertension)    No recent concerns, feeling well no changes at this time         I have discontinued Shanon Brow L. Fok's cetirizine. I am also having him start on fluticasone, loratadine, and montelukast. Additionally, I am having him maintain his Multiple Vitamins-Minerals (CENTRUM SILVER PO), clobetasol cream, polyethylene glycol, [place] Dextrin (BENEFIBER PO), traMADol, tiZANidine, oxyCODONE-acetaminophen, tamsulosin, carvedilol, hydrOXYzine, famotidine, rivaroxaban, carvedilol, and losartan.  Meds ordered this encounter  Medications  . fluticasone (FLONASE) 50 MCG/ACT nasal spray    Sig: Place 2 sprays into both nostrils daily.    Dispense:  16  g    Refill:  6  . loratadine (CLARITIN) 10 MG tablet    Sig: Take 1 tablet (10 mg total) by mouth 2 (two) times daily as needed for allergies.    Dispense:  60 tablet    Refill:  5  . montelukast (SINGULAIR) 10 MG tablet    Sig: Take 1 tablet (10 mg total) by mouth at bedtime as needed.    Dispense:  30 tablet    Refill:  3     Penni Homans, MD    I discussed the assessment and treatment plan with the patient. The patient was provided an opportunity to ask questions and all were answered. The patient agreed with the plan and demonstrated an understanding of the instructions.   The patient was advised to call back or seek an in-person evaluation if the symptoms worsen or if the condition fails to improve as anticipated.  I provided 15 minutes of non-face-to-face time during this encounter.   Penni Homans, MD

## 2018-12-29 NOTE — Assessment & Plan Note (Signed)
No recent concerns, feeling well no changes at this time

## 2019-03-29 ENCOUNTER — Other Ambulatory Visit: Payer: Self-pay | Admitting: Family Medicine

## 2019-03-29 ENCOUNTER — Other Ambulatory Visit: Payer: Self-pay | Admitting: Cardiology

## 2019-03-30 ENCOUNTER — Other Ambulatory Visit: Payer: Self-pay | Admitting: Cardiology

## 2019-04-19 ENCOUNTER — Encounter: Payer: Self-pay | Admitting: Gastroenterology

## 2019-05-24 NOTE — Progress Notes (Signed)
Virtual Visit via Video Note  I connected with patient on 05/24/29 at 11:00 AM EDT by audio enabled telemedicine application and verified that I am speaking with the correct person using two identifiers.   THIS ENCOUNTER IS A VIRTUAL VISIT DUE TO COVID-19 - PATIENT WAS NOT SEEN IN THE OFFICE. PATIENT HAS CONSENTED TO VIRTUAL VISIT / TELEMEDICINE VISIT   Location of patient: home  Location of provider: office  I discussed the limitations of evaluation and management by telemedicine and the availability of in person appointments. The patient expressed understanding and agreed to proceed.   Subjective:   EAIN LANSER is a 78 y.o. male who presents for Medicare Annual/Subsequent preventive examination.  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 Works 10-12 hrs/ wk.  Review of Systems:  Cardiac Risk Factors include: advanced age (>41men, >8 women);hypertension;male gender;dyslipidemia Home Safety/Smoke Alarms: Feels safe in home. Smoke alarms in place.  Lives w/ wife in 2 story home. Does well w/ stairs.  Male:   CCS- 05/21/16 w/ 3 yr recall. Pt states he will call to schedule. PSA-  Lab Results  Component Value Date   PSA 4.20 (H) 01/20/2018   PSA 4.10 (H) 07/18/2017   PSA 2.28 11/26/2011       Objective:    Vitals: BP 116/76 Comment: pt reported   Pulse 72   There is no height or weight on file to calculate BMI.  Advanced Directives 05/25/2019 07/15/2017 07/15/2017 06/28/2017 06/28/2017 07/11/2016 05/21/2016  Does Patient Have a Medical Advance Directive? Yes Yes Yes Yes Yes Yes No;Yes  Type of Paramedic of [place];Living will [place];Living will [place];Living will;Out of facility DNR (pink MOST or yellow form) Living will Living will [place];Living will [place]  Does patient want to make changes to medical advance directive? No - Patient declined No  - Patient declined - No - Patient declined - - -  Copy of [place] in Chart? Yes - validated most recent copy scanned in chart (See row information) No - copy requested Yes - - No - copy requested -  Pre-existing out of facility DNR order (yellow form or pink MOST form) - - - - - - -    Tobacco Social History   Tobacco Use  Smoking Status Former Smoker   Packs/day: 1.00   Years: 18.00   Pack years: 18.00   Types: Cigarettes   Quit date: 09/24/1979   Years since quitting: 39.6  Smokeless Tobacco Never Used     Counseling given: Not Answered   Clinical Intake:  Pain : No/denies pain   Past Medical History:  Diagnosis Date   Allergy    Arthritis    left hip   Atrial fibrillation ([place])    a. Eliquis initiated 04/2013.   Atrial flutter ([place])    Constipation 07/15/2017   Dermatitis 11/22/2012   Esophageal reflux 04/12/2013   Hypertension    Nonischemic cardiomyopathy ([place])    a. presumed to be tachy mediated;  b. 03/2013 Echo: EF 30-35%, diff HK, mild LVH, mildly dil LA.   Pericarditis    Plantar wart, right foot 03/26/2017   Skin lesion 11/26/2011   Past Surgical History:  Procedure Laterality Date   CALCANEAL OSTEOTOMY Right 1993   "stapled it; screwed it" (04/28/2013)   CALCANEUS HARDWARE REMOVAL Right ~ 1994   COLONOSCOPY     10-12 years ago; unable to  find records   COLONOSCOPY     FINGER AMPUTATION Right 1996   index (04/28/2013)   LEFT HEART CATHETERIZATION WITH CORONARY ANGIOGRAM N/A 09/30/2013   Procedure: LEFT HEART CATHETERIZATION WITH CORONARY ANGIOGRAM;  Surgeon: Burnell Blanks, MD;  [facility] C[facility] LAB;  Service: Cardiovascular;  Laterality: N/A;   TONSILLECTOMY  1940's   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   Procedure: TOTAL HIP ARTHROPLASTY ANTERIOR APPROACH;  Surgeon: Frederik Pear, MD;  Location: [facility];  Service: Orthopedics;  Laterality: Left;   Family History  Problem Relation Age of Onset   Stroke  Father    Asthma Mother        low back pain   Colon cancer Neg Hx    Esophageal cancer Neg Hx    Rectal cancer Neg Hx    Stomach cancer Neg Hx    Social History   Socioeconomic History   Marital status: Married    Spouse name: Not on file   Number of children: 2   Years of education: Not on file   Highest education level: Not on file  Occupational History   Occupation: self emp  Social Designer, fashion/clothing strain: Not on file   Food insecurity    Worry: Not on file    Inability: Not on file   Transportation needs    Medical: Not on file    Non-medical: Not on file  Tobacco Use   Smoking status: Former Smoker    Packs/day: 1.00    Years: 18.00    Pack years: 18.00    Types: Cigarettes    Quit date: 09/24/1979    Years since quitting: 39.6   Smokeless tobacco: Never Used  Substance and Sexual Activity   Alcohol use: Yes    Alcohol/week: 0.0 standard drinks    Comment: occasionally    Drug use: No   Sexual activity: Yes    Partners: Female  Lifestyle   Physical activity    Days per week: Not on file    Minutes per session: Not on file   Stress: Not on file  Relationships   Social connections    Talks on phone: Not on file    Gets together: Not on file    Attends religious service: Not on file    Active member of club or organization: Not on file    Attends meetings of clubs or organizations: Not on file    Relationship status: Not on file  Other Topics Concern   Not on file  Social History Narrative   Not on file    Outpatient Encounter Medications as of 05/25/2019  Medication Sig   carvedilol (COREG) 25 MG tablet Take 1 tablet (25 mg total) by mouth 2 (two) times daily with a meal.   clobetasol cream (TEMOVATE) [id number] % Apply 1 application topically 2 (two) times daily as needed (anal itch). Reported on 11/07/2015   fluticasone (FLONASE) 50 MCG/ACT nasal spray Place 2 sprays into both nostrils daily.   hydrOXYzine  (ATARAX/VISTARIL) 25 MG tablet TAKE 1-2 TABLETS (25-50 MG TOTAL) BY MOUTH AT BEDTIME AS NEEDED.   loratadine (CLARITIN) 10 MG tablet Take 1 tablet (10 mg total) by mouth 2 (two) times daily as needed for allergies.   losartan (COZAAR) 25 MG tablet TAKE 1 TABLET BY MOUTH EVERY DAY   Multiple Vitamins-Minerals (CENTRUM SILVER PO) Take 1 tablet by mouth daily.   polyethylene glycol (MIRALAX) packet Take 17 g by mouth daily.   tamsulosin (FLOMAX)  0.4 MG CAPS capsule Take 0.4 mg by mouth daily.   XARELTO 20 MG TABS tablet TAKE 1 TABLET (20 MG TOTAL) BY MOUTH DAILY WITH SUPPER.   carvedilol (COREG) 25 MG tablet Take 1 tablet (25 mg total) by mouth 2 (two) times daily.   famotidine (PEPCID) 40 MG tablet Take 1 tablet (40 mg total) by mouth daily. (Patient not taking: Reported on 05/25/2019)   montelukast (SINGULAIR) 10 MG tablet Take 1 tablet (10 mg total) by mouth at bedtime as needed. (Patient not taking: Reported on 05/25/2019)   Wheat Dextrin (BENEFIBER PO) Take 1 scoop by mouth daily.   [DISCONTINUED] oxyCODONE-acetaminophen (ROXICET) 5-325 MG tablet Take 1 tablet every 4 (four) hours as needed by mouth. (Patient not taking: Reported on 05/25/2019)   [DISCONTINUED] tiZANidine (ZANAFLEX) 2 MG tablet Take 1 tablet (2 mg total) every 6 (six) hours as needed by mouth for muscle spasms.   [DISCONTINUED] traMADol (ULTRAM) 50 MG tablet Take 1 tablet (50 mg total) by mouth 2 (two) times daily as needed for moderate pain.   No facility-administered encounter medications on file as of 05/25/2019.     Activities of Daily Living In your present state of health, do you have any difficulty performing the following activities: 05/25/2019  Hearing? N  Vision? N  Difficulty concentrating or making decisions? N  Walking or climbing stairs? N  Dressing or bathing? N  Doing errands, shopping? N  Preparing Food and eating ? N  Using the Toilet? N  In the past six months, have you accidently leaked urine? N    Do you have problems with loss of bowel control? N  Managing your Medications? N  Managing your Finances? N  Housekeeping or managing your Housekeeping? N  Some recent data might be hidden    Patient Care Team: Mosie Lukes, MD as PCP - General (Family Medicine) Stanford Breed Denice Bors, MD as Consulting Physician (Cardiology)   Assessment:   This is a routine wellness examination for Sayed. Physical assessment deferred to PCP.  Exercise Activities and Dietary recommendations Current Exercise Habits: The patient does not participate in regular exercise at present, Exercise limited by: None identified Diet (meal preparation, eat out, water intake, caffeinated beverages, dairy products, fruits and vegetables): in general, a "healthy" diet  , well balanced  Goals     Healthy Lifestyle (pt-stated)     Visit your primary care provider regularly.  Make time for family and friends.  Healthy relationships are important.  Take medications as directed by your provider.  Maintain a healthy weight and a trim waistline.  Eat healthy meals and snacks, rich in fruits, vegetables, whole grains and lean proteins.  Aim for 150 minutes of moderate physical activity each week.  Avoid alcohol or drink in moderation.  Manage stress through prayer, meditation or mindful relaxation.  Get seven to nine hours of quality sleep each night.          Fall Risk Fall Risk  05/25/2019 07/15/2017 07/15/2017 07/11/2016 05/01/2015  Falls in the past year? 0 No No No No     Depression Screen PHQ 2/9 Scores 05/25/2019 07/15/2017 07/15/2017 07/11/2016  PHQ - 2 Score 0 0 0 0    Cognitive Function Ad8 score reviewed for issues:  Issues making decisions:no  Less interest in hobbies / activities:no  Repeats questions, stories (family complaining):no  Trouble using ordinary gadgets (microwave, computer, phone):no  Forgets the month or year: no  Mismanaging finances: no  Remembering appts:no  Daily  problems with thinking and/or memory:no Ad8 score is=0   MMSE - Mini Mental State Exam 07/11/2016 05/01/2015  Orientation to time 5 5  Orientation to Place 5 5  Registration 3 3  Attention/ Calculation 5 5  Recall 2 2  Language- name 2 objects 2 2  Language- repeat 1 1  Language- follow 3 step command 3 3  Language- read & follow direction 1 1  Write a sentence 1 1  Copy design 1 1  Total score 29 29        Immunization History  Administered Date(s) Administered   Influenza, High Dose Seasonal PF 07/23/2018   Pneumococcal Conjugate-13 05/01/2015   Pneumococcal Polysaccharide-23 03/05/2012   Td 09/23/2006   Tdap 03/05/2012   Zoster 10/24/2013   Zoster Recombinat (Shingrix) 05/21/2018    Screening Tests Health Maintenance  Topic Date Due   FOOT EXAM  08/08/1951   OPHTHALMOLOGY EXAM  08/08/1951   HEMOGLOBIN A1C  07/22/2018   COLONOSCOPY  05/22/2019   TETANUS/TDAP  03/05/2022   PNA vac Low Risk Adult  Completed   INFLUENZA VACCINE  Discontinued       Plan:   See you next year!   Keep up the great work.  Please schedule you colonoscopy as discussed.  I have personally reviewed and noted the following in the patients chart:    Medical and social history  Use of alcohol, tobacco or illicit drugs   Current medications and supplements  Functional ability and status  Nutritional status  Physical activity  Advanced directives  List of other physicians  Hospitalizations, surgeries, and ER visits in previous 12 months  Vitals  Screenings to include cognitive, depression, and falls  Referrals and appointments  In addition, I have reviewed and discussed with patient certain preventive protocols, quality metrics, and best practice recommendations. A written personalized care plan for preventive services as well as general preventive health recommendations were provided to patient.     Shela Nevin, [place]  05/25/2019

## 2019-05-25 ENCOUNTER — Ambulatory Visit (INDEPENDENT_AMBULATORY_CARE_PROVIDER_SITE_OTHER): Payer: Medicare HMO | Admitting: *Deleted

## 2019-05-25 ENCOUNTER — Other Ambulatory Visit: Payer: Self-pay

## 2019-05-25 ENCOUNTER — Encounter: Payer: Self-pay | Admitting: *Deleted

## 2019-05-25 VITALS — BP 116/76 | HR 72

## 2019-05-25 DIAGNOSIS — Z0000 Encounter for general adult medical examination without abnormal findings: Secondary | ICD-10-CM

## 2019-05-25 NOTE — Patient Instructions (Signed)
See you next year!   Keep up the great work.  Please schedule you colonoscopy as discussed.   Francis Shaw , Thank you for taking time to come for your Medicare Wellness Visit. I appreciate your ongoing commitment to your health goals. Please review the following plan we discussed and let me know if I can assist you in the future.   These are the goals we discussed: Goals    . Healthy Lifestyle (pt-stated)     Visit your primary care provider regularly.  Make time for family and friends.  Healthy relationships are important.  Take medications as directed by your provider.  Maintain a healthy weight and a trim waistline.  Eat healthy meals and snacks, rich in fruits, vegetables, whole grains and lean proteins.  Aim for 150 minutes of moderate physical activity each week.  Avoid alcohol or drink in moderation.  Manage stress through prayer, meditation or mindful relaxation.  Get seven to nine hours of quality sleep each night.          This is a list of the screening recommended for you and due dates:  Health Maintenance  Topic Date Due  . Complete foot exam   08/08/1951  . Eye exam for diabetics  08/08/1951  . Hemoglobin A1C  07/22/2018  . Colon Cancer Screening  05/22/2019  . Tetanus Vaccine  03/05/2022  . Pneumonia vaccines  Completed  . Flu Shot  Discontinued    Health Maintenance After Age 66 After age 63, you are at a higher risk for certain long-term diseases and infections as well as injuries from falls. Falls are a major cause of broken bones and head injuries in people who are older than age 75. Getting regular preventive care can help to keep you healthy and well. Preventive care includes getting regular testing and making lifestyle changes as recommended by your health care provider. Talk with your health care provider about:  Which screenings and tests you should have. A screening is a test that checks for a disease when you have no symptoms.  A diet and  exercise plan that is right for you. What should I know about screenings and tests to prevent falls? Screening and testing are the best ways to find a health problem early. Early diagnosis and treatment give you the best chance of managing medical conditions that are common after age 12. Certain conditions and lifestyle choices may make you more likely to have a fall. Your health care provider may recommend:  Regular vision checks. Poor vision and conditions such as cataracts can make you more likely to have a fall. If you wear glasses, make sure to get your prescription updated if your vision changes.  Medicine review. Work with your health care provider to regularly review all of the medicines you are taking, including over-the-counter medicines. Ask your health care provider about any side effects that may make you more likely to have a fall. Tell your health care provider if any medicines that you take make you feel dizzy or sleepy.  Osteoporosis screening. Osteoporosis is a condition that causes the bones to get weaker. This can make the bones weak and cause them to break more easily.  Blood pressure screening. Blood pressure changes and medicines to control blood pressure can make you feel dizzy.  Strength and balance checks. Your health care provider may recommend certain tests to check your strength and balance while standing, walking, or changing positions.  Foot health exam. Foot pain and numbness,  as well as not wearing proper footwear, can make you more likely to have a fall.  Depression screening. You may be more likely to have a fall if you have a fear of falling, feel emotionally low, or feel unable to do activities that you used to do.  Alcohol use screening. Using too much alcohol can affect your balance and may make you more likely to have a fall. What actions can I take to lower my risk of falls? General instructions  Talk with your health care provider about your risks for  falling. Tell your health care provider if: ? You fall. Be sure to tell your health care provider about all falls, even ones that seem minor. ? You feel dizzy, sleepy, or off-balance.  Take over-the-counter and prescription medicines only as told by your health care provider. These include any supplements.  Eat a healthy diet and maintain a healthy weight. A healthy diet includes low-fat dairy products, low-fat (lean) meats, and fiber from whole grains, beans, and lots of fruits and vegetables. Home safety  Remove any tripping hazards, such as rugs, cords, and clutter.  Install safety equipment such as grab bars in bathrooms and safety rails on stairs.  Keep rooms and walkways well-lit. Activity   Follow a regular exercise program to stay fit. This will help you maintain your balance. Ask your health care provider what types of exercise are appropriate for you.  If you need a cane or walker, use it as recommended by your health care provider.  Wear supportive shoes that have nonskid soles. Lifestyle  Do not drink alcohol if your health care provider tells you not to drink.  If you drink alcohol, limit how much you have: ? 0-1 drink a day for women. ? 0-2 drinks a day for men.  Be aware of how much alcohol is in your drink. In the [place] one drink equals one typical bottle of beer (12 oz), one-half glass of wine (5 oz), or one shot of hard liquor (1 oz).  Do not use any products that contain nicotine or tobacco, such as cigarettes and e-cigarettes. If you need help quitting, ask your health care provider. Summary  Having a healthy lifestyle and getting preventive care can help to protect your health and wellness after age 4.  Screening and testing are the best way to find a health problem early and help you avoid having a fall. Early diagnosis and treatment give you the best chance for managing medical conditions that are more common for people who are older than age 84.  Falls  are a major cause of broken bones and head injuries in people who are older than age 34. Take precautions to prevent a fall at home.  Work with your health care provider to learn what changes you can make to improve your health and wellness and to prevent falls. This information is not intended to replace advice given to you by your health care provider. Make sure you discuss any questions you have with your health care provider. Document Released: 07/23/2017 Document Revised: 12/31/2018 Document Reviewed: 07/23/2017 Elsevier Patient Education  2020 [employer].

## 2019-06-07 DIAGNOSIS — R69 Illness, unspecified: Secondary | ICD-10-CM | POA: Diagnosis not present

## 2019-06-28 ENCOUNTER — Encounter: Payer: Self-pay | Admitting: Physician Assistant

## 2019-06-28 ENCOUNTER — Telehealth: Payer: Self-pay

## 2019-06-28 ENCOUNTER — Ambulatory Visit: Payer: Medicare HMO | Admitting: Physician Assistant

## 2019-06-28 VITALS — BP 110/68 | HR 66 | Temp 98.5°F | Ht 71.0 in | Wt 186.0 lb

## 2019-06-28 DIAGNOSIS — Z01818 Encounter for other preprocedural examination: Secondary | ICD-10-CM | POA: Diagnosis not present

## 2019-06-28 DIAGNOSIS — Z7901 Long term (current) use of anticoagulants: Secondary | ICD-10-CM | POA: Diagnosis not present

## 2019-06-28 DIAGNOSIS — Z86010 Personal history of colonic polyps: Secondary | ICD-10-CM

## 2019-06-28 MED ORDER — NA SULFATE-K SULFATE-MG SULF 17.5-3.13-1.6 GM/177ML PO SOLN: ORAL | 0 refills | Status: DC

## 2019-06-28 NOTE — Progress Notes (Signed)
Chief Complaint: Consultation for a surveillance colonoscopy on chronic anticoagulation  HPI:    Francis Shaw is a 78 year old Caucasian male with a past medical history of nonischemic cardiomyopathy (07/03/2017 echo with LVEF 45-50%) and A. fib on Xarelto, known to Dr. Fuller Plan, who was referred to me by Mosie Lukes, MD to consider a surveillance colonoscopy on chronic anticoagulation.      05/21/2016 colonoscopy done for positive Cologuard test.  Findings of one 12 mm polyp in the sigmoid colon, moderate diverticulosis in the sigmoid and descending colon, mild diverticulosis in the transverse colon and internal hemorrhoids.  Repeat recommended in 3 years.    Today, the patient presents clinic and tells me that he is feeling well.  He has no GI complaints and no other medical issues that have popped up in the past 3 years.  He is aware that he will have to hold his Xarelto and did fine the last time.    Social history positive for going to the beach in October.    Denies fever, chills, weight loss, change in bowel habits, abdominal pain, heartburn or reflux.     Past Medical History:  Diagnosis Date  . Allergy   . Arthritis    left hip  . Atrial fibrillation ([place])    a. Eliquis initiated 04/2013.  Marland Kitchen Atrial flutter ([place])   . Constipation 07/15/2017  . Dermatitis 11/22/2012  . Esophageal reflux 04/12/2013  . Hypertension   . Nonischemic cardiomyopathy ([place])    a. presumed to be tachy mediated;  b. 03/2013 Echo: EF 30-35%, diff HK, mild LVH, mildly dil LA.  Marland Kitchen Pericarditis   . Plantar wart, right foot 03/26/2017  . Skin lesion 11/26/2011    Past Surgical History:  Procedure Laterality Date  . CALCANEAL OSTEOTOMY Right 1993   "stapled it; screwed it" (04/28/2013)  . CALCANEUS HARDWARE REMOVAL Right ~ 1994  . COLONOSCOPY     10-12 years ago; unable to find records  . COLONOSCOPY    . 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 LEFT HEART CATHETERIZATION WITH CORONARY ANGIOGRAM N/A  09/30/2013   Procedure: LEFT HEART CATHETERIZATION WITH CORONARY ANGIOGRAM;  Surgeon: Burnell Blanks, MD;  Location: [facility] CATH LAB;  Service: Cardiovascular;  Laterality: N/A;  . TONSILLECTOMY  1940's  . 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 carvedilol (COREG) 25 MG tablet Take 1 tablet (25 mg total) by mouth 2 (two) times daily. 180 tablet 3  . carvedilol (COREG) 25 MG tablet Take 1 tablet (25 mg total) by mouth 2 (two) times daily with a meal. 180 tablet 3  . clobetasol cream (TEMOVATE) [id number] % Apply 1 application topically 2 (two) times daily as needed (anal itch). Reported on 11/07/2015 30 g 0  . famotidine (PEPCID) 40 MG tablet Take 1 tablet (40 mg total) by mouth daily. (Patient not taking: Reported on 05/25/2019) 40 tablet 5  . fluticasone (FLONASE) 50 MCG/ACT nasal spray Place 2 sprays into both nostrils daily. 16 g 6  . hydrOXYzine (ATARAX/VISTARIL) 25 MG tablet TAKE 1-2 TABLETS (25-50 MG TOTAL) BY MOUTH AT BEDTIME AS NEEDED. 30 tablet 1  . loratadine (CLARITIN) 10 MG tablet Take 1 tablet (10 mg total) by mouth 2 (two) times daily as needed for allergies. 60 tablet 5  .  losartan (COZAAR) 25 MG tablet TAKE 1 TABLET BY MOUTH EVERY DAY 90 tablet 1  . montelukast (SINGULAIR) 10 MG tablet Take 1 tablet (10 mg total) by mouth at bedtime as needed. (Patient not taking: Reported on 05/25/2019) 30 tablet 3  . Multiple Vitamins-Minerals (CENTRUM SILVER PO) Take 1 tablet by mouth daily.    . polyethylene glycol (MIRALAX) packet Take 17 g by mouth daily. 30 each 1  . tamsulosin (FLOMAX) 0.4 MG CAPS capsule Take 0.4 mg by mouth daily.  11  . Wheat Dextrin (BENEFIBER PO) Take 1 scoop by mouth daily.    Alveda Reasons 20 MG TABS tablet TAKE 1 TABLET (20 MG TOTAL) BY MOUTH DAILY WITH SUPPER. 90  tablet 2   No current facility-administered medications for this visit.     Allergies as of 06/28/2019  . (No Known Allergies)    Family History  Problem Relation Age of Onset  . Stroke Father   . Asthma Mother        low back pain  . Colon cancer Neg Hx   . Esophageal cancer Neg Hx   . Rectal cancer Neg Hx   . Stomach cancer Neg Hx     Social History   Socioeconomic History  . Marital status: Married    Spouse name: Not on file  . Number of children: 2  . Years of education: Not on file  . Highest education level: Not on file  Occupational History  . Occupation: self emp  Social Needs  . Financial resource strain: Not on file  . Food insecurity    Worry: Not on file    Inability: Not on file  . Transportation needs    Medical: Not on file    Non-medical: Not on file  Tobacco Use  . Smoking status: Former Smoker    Packs/day: 1.00    Years: 18.00    Pack years: 18.00    Types: Cigarettes    Quit date: 09/24/1979    Years since quitting: 39.7  . Smokeless tobacco: Never Used  Substance and Sexual Activity  . Alcohol use: Yes    Alcohol/week: 0.0 standard drinks    Comment: occasionally   . Drug use: No  . Sexual activity: Yes    Partners: Female  Lifestyle  . Physical activity    Days per week: Not on file    Minutes per session: Not on file  . Stress: Not on file  Relationships  . Social Herbalist on phone: Not on file    Gets together: Not on file    Attends religious service: Not on file    Active member of club or organization: Not on file    Attends meetings of clubs or organizations: Not on file    Relationship status: Not on file  . Intimate partner violence    Fear of current or ex partner: Not on file    Emotionally abused: Not on file    Physically abused: Not on file    Forced sexual activity: Not on file  Other Topics Concern  . Not on file  Social History Narrative  . Not on file    Review of Systems:     Constitutional: No weight loss, fever or chills Skin: No rash  Cardiovascular: No chest pain  Respiratory: No SOB  Gastrointestinal: See HPI and otherwise negative Genitourinary: No dysuria Neurological: No headache, dizziness or syncope Musculoskeletal: No new muscle or joint pain Hematologic: No bleeding  Psychiatric: No history of depression or anxiety   Physical Exam:  Vital signs: BP 110/68   Pulse 66   Temp 98.5 F (36.9 C)   Ht 5\' 11"  (1.803 m)   Wt 186 lb (84.4 kg)   BMI 25.94 kg/m   Constitutional:   Pleasant Caucasian male appears to be in NAD, Well developed, Well nourished, alert and cooperative Head:  Normocephalic and atraumatic. Eyes:   PEERL, EOMI. No icterus. Conjunctiva pink. Ears:  Normal auditory acuity. Neck:  Supple Throat: Oral cavity and pharynx without inflammation, swelling or lesion.  Respiratory: Respirations even and unlabored. Lungs clear to auscultation bilaterally.   No wheezes, crackles, or rhonchi.  Cardiovascular: Normal S1, S2. No MRG. Regular rate and rhythm. No peripheral edema, cyanosis or pallor.  Gastrointestinal:  Soft, nondistended, nontender. No rebound or guarding. Normal bowel sounds. No appreciable masses or hepatomegaly. Rectal:  Not performed.  Msk:  Symmetrical without gross deformities. Without edema, no deformity or joint abnormality.  Neurologic:  Alert and  oriented x4;  grossly normal neurologically.  Skin:   Dry and intact without significant lesions or rashes. Psychiatric:  Demonstrates good judgement and reason without abnormal affect or behaviors.  No recent labs.  Assessment: 1.  History of tubular adenoma: Last colonoscopy 2017 with recommendations to repeat in 3 years 2.  Chronic anticoagulation: With Xarelto for A. fib  Plan: 1.  Scheduled patient for surveillance colonoscopy given his history of tubular adenomas with Dr. Fuller Plan in the [facility].  Did discuss risks, benefits, limitations and alternatives and the  patient agrees to proceed. Patient requests November. 2.  Patient was advised to hold his Xarelto for 2 days prior to time of his procedure.  We will contact his prescribing physician to ensure that holding his Xarelto is acceptable for him. 3.  Patient to follow in clinic per recommendations from Dr. Fuller Plan after time of procedure.  Ellouise Newer, PA-C [facility] 06/28/2019, 1:13 PM  Cc: Mosie Lukes, MD

## 2019-06-28 NOTE — Progress Notes (Signed)
Reviewed and agree with management plan.  Maat Kafer T. Norelle Runnion, MD FACG [facility]  

## 2019-06-28 NOTE — Telephone Encounter (Signed)
He can hold the Xarelto for 2 days prior to the scope and the day of the scope thenrestart the day after the scope. Please let them know.

## 2019-06-28 NOTE — Telephone Encounter (Signed)
   [facility] [address] [place], [place]  [address] Phone:  [phone]   Fax:  [phone]   06/28/2019   RE:      Francis Shaw DOB:   [birth year] MRN:   [id number]   Dear Dr. Charlett Blake,    We have scheduled the above patient for an endoscopic procedure. Our records show that he is on anticoagulation therapy.   Please advise as to whether the patient may come off his therapy of Xarelto two days prior to the colonoscopy procedure, which is scheduled for 07/28/19.  Please fax back/ or route to [place], [place] at [phone].   Sincerely,    Thurmon Fair, RMA

## 2019-06-28 NOTE — Patient Instructions (Signed)
If you are age 77 or older, your body mass index should be between 23-30. Your Body mass index is 25.94 kg/m. If this is out of the aforementioned range listed, please consider follow up with your Primary Care Provider.  If you are age 86 or younger, your body mass index should be between 19-25. Your Body mass index is 25.94 kg/m. If this is out of the aformentioned range listed, please consider follow up with your Primary Care Provider.   You have been scheduled for a colonoscopy. Please follow written instructions given to you at your visit today.  Please pick up your prep supplies at the pharmacy within the next 1-3 days. If you use inhalers (even only as needed), please bring them with you on the day of your procedure. Your physician has requested that you go to www.startemmi.com and enter the access code given to you at your visit today. This web site gives a general overview about your procedure. However, you should still follow specific instructions given to you by our office regarding your preparation for the procedure.  We have sent the following medications to your pharmacy for you to pick up at your convenience: [place] will be contacted by our office prior to your procedure for directions on holding your Xarelto.  If you do not hear from our office 1 week prior to your scheduled procedure, please call [phone] to discuss.   Thank you for choosing me and [facility].    Ellouise Newer, PA-C

## 2019-06-29 NOTE — Telephone Encounter (Signed)
Spoke with patients wife regarding holding Xarelto two days prior to procedure.  She will let patient know that he has been cleared to hold medication for two days prior.

## 2019-07-09 ENCOUNTER — Other Ambulatory Visit: Payer: Self-pay | Admitting: Cardiology

## 2019-07-09 DIAGNOSIS — I428 Other cardiomyopathies: Secondary | ICD-10-CM

## 2019-07-09 MED ORDER — CARVEDILOL 25 MG PO TABS: 25.0000 mg | ORAL_TABLET | Freq: Two times a day (BID) | ORAL | 0 refills | Status: DC

## 2019-07-09 NOTE — Telephone Encounter (Signed)
°*  STAT* If patient is at the pharmacy, call can be transferred to refill team.   1. Which medications need to be refilled? (please list name of each medication and dose if known) Carvedilol 25mg   2. Which pharmacy/location (including street and city if local pharmacy) is medication to be sent to?[id number]  3. Do they need a 30 day or 90 day supply? [place]

## 2019-07-09 NOTE — Telephone Encounter (Signed)
Let patient know that I refilled his Carvedilol.

## 2019-07-20 DIAGNOSIS — N401 Enlarged prostate with lower urinary tract symptoms: Secondary | ICD-10-CM | POA: Diagnosis not present

## 2019-07-23 DIAGNOSIS — R3912 Poor urinary stream: Secondary | ICD-10-CM | POA: Diagnosis not present

## 2019-07-23 DIAGNOSIS — R972 Elevated prostate specific antigen [PSA]: Secondary | ICD-10-CM | POA: Diagnosis not present

## 2019-07-23 DIAGNOSIS — N401 Enlarged prostate with lower urinary tract symptoms: Secondary | ICD-10-CM | POA: Diagnosis not present

## 2019-07-26 ENCOUNTER — Encounter: Payer: Self-pay | Admitting: Gastroenterology

## 2019-07-28 ENCOUNTER — Encounter: Payer: Self-pay | Admitting: Gastroenterology

## 2019-07-28 ENCOUNTER — Ambulatory Visit (AMBULATORY_SURGERY_CENTER): Payer: Medicare HMO | Admitting: Gastroenterology

## 2019-07-28 ENCOUNTER — Telehealth: Payer: Self-pay | Admitting: Cardiology

## 2019-07-28 ENCOUNTER — Other Ambulatory Visit: Payer: Self-pay

## 2019-07-28 VITALS — BP 108/65 | HR 127 | Resp 27 | Ht 71.0 in | Wt 186.0 lb

## 2019-07-28 DIAGNOSIS — Z86010 Personal history of colonic polyps: Secondary | ICD-10-CM

## 2019-07-28 DIAGNOSIS — D123 Benign neoplasm of transverse colon: Secondary | ICD-10-CM

## 2019-07-28 MED ORDER — SODIUM CHLORIDE 0.9 % IV SOLN: 500.0000 mL | Freq: Once | INTRAVENOUS | Status: DC

## 2019-07-28 NOTE — Telephone Encounter (Signed)
Patient with diagnosis of atrial fibrillation on Xarelto for anticoagulation. Of note, colonoscopy procedure on 07/28/19. Patient held Xarelto 2 days prior and was instructed to resume Xarelto 2 days after the procedure.  Procedure: Rezum Date of procedure: TBD  CHADS2-VASc score of 4 (CHF, HTN, AGEx2)  CrCl ~80 mL/min  Per office protocol, patient can hold Xarelto for 2 days prior to procedure.

## 2019-07-28 NOTE — Telephone Encounter (Signed)
New Message        [facility] Pre-operative Risk Assessment    Request for surgical clearance:  1. What type of surgery is being performed? Rezum  2. When is this surgery scheduled? Pending Clearance  3. What type of clearance is required (medical clearance vs. Pharmacy clearance to hold med vs. Both)? Both   4. Are there any medications that need to be held prior to surgery and how long? Xarelto, 2-3 days prior   5. Practice name and name of physician performing surgery? [facility]  6. Festus Aloe   7. What is your office phone number [phone] ext [phone]   7.   What is your office fax number [phone]   8.   Anesthesia type (None, local, MAC, general) ? Nitrous Oxide    Marca Ancona 07/28/2019, 9:54 AM  _________________________________________________________________   (provider comments below)

## 2019-07-28 NOTE — Telephone Encounter (Signed)
I s/w pt and informed of recommendations that he will need an appt before he can be cleared for his procedure with [facility]. Pt has been scheduled to see Rosaria Ferries, [facility] 07/30/19 @ 11:30 am at the [facility] office location. Pt thanked me for the call. I will forward clearance information to [facility] for upcoming appt. I will remove from the Pre Op Call Back pool.

## 2019-07-28 NOTE — Progress Notes (Signed)
Called to room to assist during endoscopic procedure.  Patient ID and intended procedure confirmed with present staff. Received instructions for my participation in the procedure from the performing physician.  

## 2019-07-28 NOTE — Progress Notes (Signed)
PT taken to PACU. Monitors in place. VSS. Report given to RN. 

## 2019-07-28 NOTE — Progress Notes (Signed)
VS per Shanon Payor per June

## 2019-07-28 NOTE — Patient Instructions (Signed)
Information on polyps, diverticulosis and hemorrhoids given to you today.  Await pathology results.  Restart Xarelto in 2 days at prior dose.  YOU HAD AN ENDOSCOPIC PROCEDURE TODAY AT [facility]:   Refer to the procedure report that was given to you for any specific questions about what was found during the examination.  If the procedure report does not answer your questions, please call your gastroenterologist to clarify.  If you requested that your care partner not be given the details of your procedure findings, then the procedure report has been included in a sealed envelope for you to review at your convenience later.  YOU SHOULD EXPECT: Some feelings of bloating in the abdomen. Passage of more gas than usual.  Walking can help get rid of the air that was put into your GI tract during the procedure and reduce the bloating. If you had a lower endoscopy (such as a colonoscopy or flexible sigmoidoscopy) you may notice spotting of blood in your stool or on the toilet paper. If you underwent a bowel prep for your procedure, you may not have a normal bowel movement for a few days.  Please Note:  You might notice some irritation and congestion in your nose or some drainage.  This is from the oxygen used during your procedure.  There is no need for concern and it should clear up in a day or so.  SYMPTOMS TO REPORT IMMEDIATELY:   Following lower endoscopy (colonoscopy or flexible sigmoidoscopy):  Excessive amounts of blood in the stool  Significant tenderness or worsening of abdominal pains  Swelling of the abdomen that is new, acute  Fever of 100F or higher   For urgent or emergent issues, a gastroenterologist can be reached at any hour by calling [phone].   DIET:  We do recommend a small meal at first, but then you may proceed to your regular diet.  Drink plenty of fluids but you should avoid alcoholic beverages for 24 hours.  ACTIVITY:  You should plan to take it  easy for the rest of today and you should NOT DRIVE or use heavy machinery until tomorrow (because of the sedation medicines used during the test).    FOLLOW UP: Our staff will call the number listed on your records 48-72 hours following your procedure to check on you and address any questions or concerns that you may have regarding the information given to you following your procedure. If we do not reach you, we will leave a message.  We will attempt to reach you two times.  During this call, we will ask if you have developed any symptoms of COVID 19. If you develop any symptoms (ie: fever, flu-like symptoms, shortness of breath, cough etc.) before then, please call [phone].  If you test positive for Covid 19 in the 2 weeks post procedure, please call and report this information to [place].    If any biopsies were taken you will be contacted by phone or by letter within the next 1-3 weeks.  Please call us at [phone] if you have not heard about the biopsies in 3 weeks.    SIGNATURES/CONFIDENTIALITY: You and/or your care partner have signed paperwork which will be entered into your electronic medical record.  These signatures attest to the fact that that the information above on your After Visit Summary has been reviewed and is understood.  Full responsibility of the confidentiality of this discharge information lies with you and/or your care-partner.

## 2019-07-28 NOTE — Telephone Encounter (Signed)
   Primary Cardiologist:Brian Stanford Breed, MD  Chart reviewed as part of pre-operative protocol coverage. Because of Tayseer Barile Guderian's past medical history and time since last visit, he/she will require a follow-up visit in order to better assess preoperative cardiovascular risk.  Pre-op covering staff: - Please schedule appointment and call patient to inform them. - Please contact requesting surgeon's office via preferred method (i.e, phone, fax) to inform them of need for appointment prior to surgery.  If applicable, this message will also be routed to pharmacy pool and/or primary cardiologist for input on holding anticoagulant/antiplatelet agent as requested below so that this information is available at time of patient's appointment.   Kathyrn Drown, NP  07/28/2019, 10:04 AM

## 2019-07-28 NOTE — Op Note (Signed)
[place] Patient Name: Francis Shaw Procedure Date: 07/28/2019 11:10 AM MRN: [id number] Endoscopist: Ladene Artist , MD Age: 78 Referring MD:  Date of Birth: 21-Sep-1941 Gender: Male Account #: [id number] Procedure:                Colonoscopy Indications:              Surveillance: Personal history of adenomatous                            polyps on last colonoscopy 3 years ago Medicines:                Monitored Anesthesia Care Procedure:                Pre-Anesthesia Assessment:                           - Prior to the procedure, a History and Physical                            was performed, and patient medications and                            allergies were reviewed. The patient's tolerance of                            previous anesthesia was also reviewed. The risks                            and benefits of the procedure and the sedation                            options and risks were discussed with the patient.                            All questions were answered, and informed consent                            was obtained. Prior Anticoagulants: The patient has                            taken Xarelto (rivaroxaban), last dose was 2 days                            prior to procedure. ASA Grade Assessment: III - A                            patient with severe systemic disease. After                            reviewing the risks and benefits, the patient was                            deemed in satisfactory condition to undergo the  procedure.                           After obtaining informed consent, the colonoscope                            was passed under direct vision. Throughout the                            procedure, the patient's blood pressure, pulse, and                            oxygen saturations were monitored continuously. The                            Colonoscope was introduced through the anus and                   advanced to the the cecum, identified by                            appendiceal orifice and ileocecal valve. The                            ileocecal valve, appendiceal orifice, and rectum                            were photographed. The quality of the bowel                            preparation was good. The colonoscopy was performed                            without difficulty. The patient tolerated the                            procedure well. Scope In: 11:17:22 AM Scope Out: 11:29:39 AM Scope Withdrawal Time: 0 hours 10 minutes 38 seconds  Total Procedure Duration: 0 hours 12 minutes 17 seconds  Findings:                 The perianal and digital rectal examinations were                            normal.                           A 7 mm polyp was found in the transverse colon. The                            polyp was sessile. The polyp was removed with a                            cold snare. Resection and retrieval were complete.  Multiple medium-mouthed diverticula were found in                            the left colon. There was no evidence of                            diverticular bleeding.                           Internal hemorrhoids were found during                            retroflexion. The hemorrhoids were small and Grade                            I (internal hemorrhoids that do not prolapse).                           The exam was otherwise without abnormality on                            direct and retroflexion views. Complications:            No immediate complications. Estimated blood loss:                            None. Estimated Blood Loss:     Estimated blood loss: none. Impression:               - One 7 mm polyp in the transverse colon, removed                            with a cold snare. Resected and retrieved.                           - Moderate diverticulosis in the left colon.                           -  Internal hemorrhoids.                           - The examination was otherwise normal on direct                            and retroflexion views. Recommendation:           - Resume Xarelto (rivaroxaban) in 2 days at prior                            dose. Refer to managing physician for further                            adjustment of therapy.                           - Patient has a contact number available for  emergencies. The signs and symptoms of potential                            delayed complications were discussed with the                            patient. Return to normal activities tomorrow.                            Written discharge instructions were provided to the                            patient.                           - High fiber diet.                           - Continue present medications.                           - Await pathology results.                           - No repeat colonoscopy due to age. Ladene Artist, MD 07/28/2019 11:37:42 AM This report has been signed electronically.

## 2019-07-30 ENCOUNTER — Encounter: Payer: Self-pay | Admitting: Physician Assistant

## 2019-07-30 ENCOUNTER — Ambulatory Visit: Payer: Medicare HMO | Admitting: Physician Assistant

## 2019-07-30 ENCOUNTER — Other Ambulatory Visit: Payer: Self-pay

## 2019-07-30 ENCOUNTER — Telehealth: Payer: Self-pay

## 2019-07-30 VITALS — BP 120/84 | HR 76 | Ht 71.0 in | Wt 183.0 lb

## 2019-07-30 DIAGNOSIS — I5042 Chronic combined systolic (congestive) and diastolic (congestive) heart failure: Secondary | ICD-10-CM

## 2019-07-30 DIAGNOSIS — I10 Essential (primary) hypertension: Secondary | ICD-10-CM | POA: Diagnosis not present

## 2019-07-30 DIAGNOSIS — I482 Chronic atrial fibrillation, unspecified: Secondary | ICD-10-CM | POA: Diagnosis not present

## 2019-07-30 DIAGNOSIS — I428 Other cardiomyopathies: Secondary | ICD-10-CM

## 2019-07-30 DIAGNOSIS — Z01810 Encounter for preprocedural cardiovascular examination: Secondary | ICD-10-CM

## 2019-07-30 MED ORDER — LOSARTAN POTASSIUM 25 MG PO TABS: 25.0000 mg | ORAL_TABLET | Freq: Every day | ORAL | 1 refills | Status: DC

## 2019-07-30 MED ORDER — CARVEDILOL 25 MG PO TABS: 25.0000 mg | ORAL_TABLET | Freq: Two times a day (BID) | ORAL | 1 refills | Status: DC

## 2019-07-30 MED ORDER — RIVAROXABAN 20 MG PO TABS: 20.0000 mg | ORAL_TABLET | Freq: Every day | ORAL | 2 refills | Status: DC

## 2019-07-30 NOTE — Progress Notes (Signed)
Cardiology Office Note   Date:  07/30/2019   ID:  Francis Shaw, DOB Nov 24, 1940, MRN [id number]  PCP:  Francis Lukes, MD Cardiologist:  Francis Ruths, MD 06/03/2018 Electrphysiologist: None Francis Ferries, PA-C   No chief complaint on file.   History of Present Illness: Francis Shaw is a 78 y.o. 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 Is to restart the Xarelto tonight.   However, needs a prostate procedure, Rezum treatment for BPH. He is to have this soon, needs cardiac clearance for it.   He gets winded with extra exertion, but no recent change. Can climb stairs w/out problems. 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   Never aware of the atrial fib. Never has palpitations. No hx presyncope or syncope.  SBP 110-120s, HR 70-80s normally.  Checks BP/HR when he feels bad. Does not check VS that often.  Occasionally drinks Gatorade, mostly water.   Has not felt like playing much golf lately, just doesn't feel like it. May get back to it. Gets frustrated with not being able to do what he used to do.    Past Medical History:  Diagnosis Date  . Allergy   . Arthritis    left hip  . Atrial fibrillation ([place])    a. Eliquis initiated 04/2013. b. on Xarelto 2020  . Atrial flutter ([place])   . Constipation 07/15/2017  . Dermatitis 11/22/2012  . Esophageal reflux 04/12/2013  . Hypertension   . Nonischemic cardiomyopathy ([place])    a. presumed to be tachy mediated;  b. 03/2013 Echo: EF 30-35%, diff HK, mild LVH, mildly dil LA.  Marland Kitchen Pericarditis   . Plantar wart, right foot 03/26/2017  . Skin lesion 11/26/2011    Past Surgical History:   Procedure Laterality Date  . CALCANEAL OSTEOTOMY Right 1993   "stapled it; screwed it" (04/28/2013)  . CALCANEUS HARDWARE REMOVAL Right ~ 1994  . COLONOSCOPY     10-12 years ago; unable to find records  . COLONOSCOPY    . 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 LEFT HEART CATHETERIZATION WITH CORONARY ANGIOGRAM N/A 09/30/2013   Procedure: LEFT HEART CATHETERIZATION WITH CORONARY ANGIOGRAM;  Surgeon: Burnell Blanks, MD;  Location: [facility] CATH LAB;  Service: Cardiovascular;  Laterality: N/A;  . TONSILLECTOMY  1940's  . 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 carvedilol (COREG) 25 MG tablet Take 1 tablet (25 mg total) by mouth 2 (two) times daily with a meal. 180 tablet 1  . fluticasone (FLONASE) 50 MCG/ACT nasal spray Place 2 sprays into both nostrils daily as needed for allergies or rhinitis.    . hydrOXYzine (ATARAX/VISTARIL) 25 MG tablet TAKE 1-2 TABLETS (25-50 MG TOTAL) BY MOUTH AT BEDTIME AS NEEDED. 30 tablet 1  . loratadine (CLARITIN) 10 MG tablet Take 1 tablet (10 mg total) by mouth 2 (two) times daily as needed for allergies. 60 tablet 5  . losartan (  COZAAR) 25 MG tablet Take 1 tablet (25 mg total) by mouth daily. 90 tablet 1  . Multiple Vitamins-Minerals (CENTRUM SILVER PO) Take 1 tablet by mouth daily.    . rivaroxaban (XARELTO) 20 MG TABS tablet Take 1 tablet (20 mg total) by mouth daily with supper. 90 tablet 2  . tamsulosin (FLOMAX) 0.4 MG CAPS capsule Take 0.4 mg by mouth daily.  11   No current facility-administered medications for this visit.     Allergies:   Patient has no known allergies.    Social History:  The patient  reports that he quit smoking about 39 years ago. His smoking use included cigarettes. He has a 18.00 pack-year smoking history. He  has never used smokeless tobacco. He reports current alcohol use. He reports that he does not use drugs.   Family History:  The patient's family history includes Asthma in his mother; Stroke in his father.  He indicated that his mother is alive. He indicated that his father is deceased. He indicated that his sister is alive. He indicated that his maternal grandmother is deceased. He indicated that his maternal grandfather is deceased. He indicated that his paternal grandmother is deceased. He indicated that his paternal grandfather is deceased. He indicated that both of his sons are alive. He indicated that the status of his neg hx is unknown.    ROS:  Please see the history of present illness. All other systems are reviewed and negative.    PHYSICAL EXAM: VS:  BP 120/84   Pulse 76   Ht 5\' 11"  (1.803 m)   Wt 183 lb (83 kg)   BMI 25.52 kg/m  , BMI Body mass index is 25.52 kg/m. GEN: Well nourished, well developed, male in no acute distress HEENT: normal for age  Neck: no JVD, no carotid bruit, no masses Cardiac: Irreg R&R; no murmur, no rubs, or gallops Respiratory:  clear to auscultation bilaterally, normal work of breathing GI: soft, nontender, nondistended, + BS MS: no deformity or atrophy; no edema; distal pulses are 2+ in all 4 extremities  Skin: warm and dry, no rash Neuro:  Strength and sensation are intact Psych: euthymic mood, full affect   EKG:  EKG is ordered today. 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 Systolic function was   mildly reduced. The estimated ejection fraction was in the range   of 45% to 50%. Wall motion was normal; there were no regional   wall motion abnormalities. Features are consistent with a   pseudonormal left ventricular filling pattern, with concomitant   abnormal relaxation and increased filling pressure (grade 2   diastolic dysfunction). -  Ventricular septum: Septal motion showed abnormal function,   dyssynergy, and paradox. These changes are consistent with   intraventricular conduction delay. - Pulmonary arteries: PA peak pressure: 31 mm Hg (S).  Recent Labs: No results found for requested labs within last 8760 hours.  CBC    Component Value Date/Time   WBC 7.9 07/23/2018 1519   RBC 5.02 07/23/2018 1519   HGB 15.4 07/23/2018 1519   HGB 13.2 06/03/2018 0920   HCT 44.5 07/23/2018 1519   HCT 39.1 06/03/2018 0920   PLT 207.0 07/23/2018 1519   PLT 175 06/03/2018 0920   MCV 88.6 07/23/2018 1519   MCV 89 06/03/2018 0920   MCH 29.9 06/03/2018 0920   MCH 29.5 07/29/2017 0730   MCHC 34.6 07/23/2018 1519  RDW 12.9 07/23/2018 1519   RDW 12.8 06/03/2018 0920   LYMPHSABS 1.3 01/20/2018 1509   MONOABS 0.9 01/20/2018 1509   EOSABS 0.3 01/20/2018 1509   BASOSABS 0.1 01/20/2018 1509   CMP Latest Ref Rng & Units 07/23/2018 06/03/2018 01/20/2018  Glucose 70 - 99 mg/dL 82 114(H) 92  BUN 6 - 23 mg/dL 13 14 11   Creatinine 0.40 - 1.50 mg/dL 0.91 0.85 0.81  Sodium 135 - 145 mEq/L 142 143 142  Potassium 3.5 - 5.1 mEq/L 4.2 4.5 4.1  Chloride 96 - 112 mEq/L 106 104 105  CO2 19 - 32 mEq/L 27 24 31   Calcium 8.4 - 10.5 mg/dL 9.7 9.0 9.3  Total Protein 6.0 - 8.3 g/dL 6.4 - 6.2  Total Bilirubin 0.2 - 1.2 mg/dL 0.7 - 0.6  Alkaline Phos 39 - 117 U/L 82 - 76  AST 0 - 37 U/L 15 - 14  ALT 0 - 53 U/L 11 - 11     Lipid Panel Lab Results  Component Value Date   CHOL 172 07/23/2018   HDL 41.00 07/23/2018   LDLCALC 91 07/23/2018   TRIG 199.0 (H) 07/23/2018   CHOLHDL 4 07/23/2018      Wt Readings from Last 3 Encounters:  07/30/19 183 lb (83 kg)  07/28/19 186 lb (84.4 kg)  06/28/19 186 lb (84.4 kg)     Other studies Reviewed: Additional studies/ records that were reviewed today include: Office notes, hospital records and testing.  ASSESSMENT AND PLAN:  1.  Chronic atrial fib - HR is controlled, asymptomatic - both atria were  normal in size 2018 and monitor was SR, but suspect he is in Afib all the time now.  - continue rate control and anticoagulation - CHA2DS2-VASc = 4 (age x 2, HTN, CHF)  2. Chronic combined systolic and diastolic CHF - EF was [id number] w/ grade 2 dd in 2018 - volume status is good, no sig limitations on activity due to SOB/DOE - not currently on a diuretic, does not need  3. Preop cardiovascular eval - RCI is 1, for 0.9% periop risk of major cardiac events - DASI score is 37.45, for functional capacity in METS of 7.34 - acceptable cardiac risk for the planned procedure, no further workup needed  Current medicines are reviewed at length with the patient today.  The patient does not have concerns regarding medicines.  The following changes have been made:  no change  Labs/ tests ordered today include:   Orders Placed This Encounter  Procedures  . EKG 12-Lead     Disposition:   FU with Francis Ruths, MD  Signed, Francis Ferries, PA-C  07/30/2019 5:20 PM    [facility] Phone: [phone]; Fax: [phone]

## 2019-07-30 NOTE — Patient Instructions (Signed)
Medication Instructions:  Your physician recommends that you continue on your current medications as directed. Please refer to the Current Medication list given to you today.  *If you need a refill on your cardiac medications before your next appointment, please call your pharmacy*  Lab Work: Please have your cholesterol checked with Dr. Charlett Blake.  If you have labs (blood work) drawn today and your tests are completely normal, you will receive your results only by: Marland Kitchen MyChart Message (if you have MyChart) OR . A paper copy in the mail If you have any lab test that is abnormal or we need to change your treatment, we will call you to review the results.   Follow-Up: At [facility], you and your health needs are our priority.  As part of our continuing mission to provide you with exceptional heart care, we have created designated Provider Care Teams.  These Care Teams include your primary Cardiologist (physician) and Advanced Practice Providers (APPs -  Physician Assistants and Nurse Practitioners) who all work together to provide you with the care you need, when you need it.  Your next appointment:   12 months  The format for your next appointment:   In Person  Provider:   You may see Kirk Ruths, MD or one of the following Advanced Practice Providers on your designated Care Team:    Kerin Ransom, PA-C  [place], [place]  Coletta Memos, [place]

## 2019-07-30 NOTE — Telephone Encounter (Signed)
  Follow up Call-  Call back number 07/28/2019  Post procedure Call Back phone  # [phone]  Permission to leave phone message Yes  Some recent data might be hidden     Patient questions:  Do you have a fever, pain , or abdominal swelling? No. Pain Score  0 *  Have you tolerated food without any problems? Yes.    Have you been able to return to your normal activities? Yes.    Do you have any questions about your discharge instructions: Diet   No. Medications  No. Follow up visit  No.  Do you have questions or concerns about your Care? No.  Actions: * If pain score is 4 or above: No action needed, pain <4.  1. Have you developed a fever since your procedure? no  2.   Have you had an respiratory symptoms (SOB or cough) since your procedure? no  3.   Have you tested positive for COVID 19 since your procedure no  4.   Have you had any family members/close contacts diagnosed with the COVID 19 since your procedure?  no   If yes to any of these questions please route to Joylene John, RN and Alphonsa Gin, Therapist, sports.

## 2019-07-30 NOTE — Telephone Encounter (Signed)
   Primary Cardiologist: Kirk Ruths, MD  Chart reviewed as part of pre-operative protocol coverage.   He was seen today in the office and is doing very well. See office note for more details, if needed.  Therefore, based on ACC/AHA guidelines, Francis Shaw would be at acceptable risk for the planned procedure without further cardiovascular testing.   I will route this recommendation to the requesting party via Epic fax function and remove from pre-op pool.  Please call with questions.  Rosaria Ferries, PA-C 07/30/2019, 5:38 PM   5. Practice name and name of physician performing surgery? [facility]  6. Festus Aloe   7. What is your office phone number [phone] ext [phone]   7.   What is your office fax number [phone]

## 2019-08-06 ENCOUNTER — Encounter: Payer: Self-pay | Admitting: Gastroenterology

## 2019-09-20 DIAGNOSIS — R3912 Poor urinary stream: Secondary | ICD-10-CM | POA: Diagnosis not present

## 2019-09-20 DIAGNOSIS — N401 Enlarged prostate with lower urinary tract symptoms: Secondary | ICD-10-CM | POA: Diagnosis not present

## 2019-09-21 ENCOUNTER — Encounter: Payer: Self-pay | Admitting: Family Medicine

## 2019-09-24 HISTORY — PX: COLONOSCOPY: SHX5424

## 2019-10-03 ENCOUNTER — Ambulatory Visit: Payer: Medicare Other | Attending: Internal Medicine

## 2019-10-03 ENCOUNTER — Other Ambulatory Visit: Payer: Self-pay

## 2019-10-03 DIAGNOSIS — Z23 Encounter for immunization: Secondary | ICD-10-CM

## 2019-10-03 NOTE — Progress Notes (Signed)
   Covid-19 Vaccination Clinic  Name:  Francis Shaw    MRN: [id number] DOB: 17-Jun-1941  10/03/2019  Francis Shaw was observed post Covid-19 immunization for 30 minutes based on pre-vaccination screening without incidence. He was provided with Vaccine Information Sheet and instruction to access the V-Safe system.   Francis Shaw was instructed to call 911 with any severe reactions post vaccine: Marland Kitchen Difficulty breathing  . Swelling of your face and throat  . A fast heartbeat  . A bad rash all over your body  . Dizziness and weakness    Immunizations Administered    Name Date Dose VIS Date Route   Pfizer COVID-19 Vaccine 10/03/2019  2:12 PM 0.3 mL 09/03/2019 Intramuscular   Manufacturer: [place]   Lot: [id number]   [place]: [id number]

## 2019-10-21 DIAGNOSIS — R3912 Poor urinary stream: Secondary | ICD-10-CM | POA: Diagnosis not present

## 2019-10-21 DIAGNOSIS — N401 Enlarged prostate with lower urinary tract symptoms: Secondary | ICD-10-CM | POA: Diagnosis not present

## 2019-10-24 ENCOUNTER — Ambulatory Visit: Payer: Medicare HMO | Attending: Internal Medicine

## 2019-10-24 DIAGNOSIS — Z23 Encounter for immunization: Secondary | ICD-10-CM | POA: Insufficient documentation

## 2019-10-24 NOTE — Progress Notes (Signed)
   Covid-19 Vaccination Clinic  Name:  Francis Shaw    MRN: [id number] DOB: [birth year]  10/24/2019  Francis Shaw was observed post Covid-19 immunization for 15 minutes without incidence. He was provided with Vaccine Information Sheet and instruction to access the V-Safe system.   Francis Shaw was instructed to call 911 with any severe reactions post vaccine: Marland Kitchen Difficulty breathing  . Swelling of your face and throat  . A fast heartbeat  . A bad rash all over your body  . Dizziness and weakness    Immunizations Administered    Name Date Dose VIS Date Route   Pfizer COVID-19 Vaccine 10/24/2019 11:34 AM 0.3 mL 09/03/2019 Intramuscular   Manufacturer: [place]   Lot: [id number]   [place]: [id number]

## 2019-11-25 ENCOUNTER — Other Ambulatory Visit: Payer: Self-pay | Admitting: Physician Assistant

## 2020-01-12 DIAGNOSIS — R972 Elevated prostate specific antigen [PSA]: Secondary | ICD-10-CM | POA: Diagnosis not present

## 2020-01-17 ENCOUNTER — Telehealth: Payer: Self-pay

## 2020-01-17 DIAGNOSIS — Z01810 Encounter for preprocedural cardiovascular examination: Secondary | ICD-10-CM

## 2020-01-17 NOTE — Telephone Encounter (Signed)
   [facility] Pre-operative Risk Assessment    Request for surgical clearance:  1. What type of surgery is being performed? Prostate biopsy  2. When is this surgery scheduled? 02/24/20  3. What type of clearance is required (medical clearance vs. Pharmacy clearance to hold med vs. Both)? both  4. Are there any medications that need to be held prior to surgery and how long? Xarelto 3 days prior to biopsy  5. Practice name and name of physician performing surgery? [facility]-- Dr. Junious Silk  6. What is your office phone number [phone]   7.   What is your office fax number [phone]  8.   Anesthesia type (None, local, MAC, general) ? Local    Francis Shaw 01/17/2020, 5:41 PM  _________________________________________________________________   (provider comments below)

## 2020-01-18 NOTE — Telephone Encounter (Signed)
Pt takes Xarelto for afib with CHADS2VASc score of 4 (age x2, CHF, HTN).  Pt has not had renal function checked since October 2019. Recommend obtaining updated BMET and CBC. If renal function is normal, ok to hold Xarelto for 3 days as requested.

## 2020-01-19 NOTE — Telephone Encounter (Signed)
Pre op call back- please tell the patient he needs labs done before pharmacy can make recommendations about holding anticoagulation.  He needs a BMP and CBC. Once those are done pharmacy can make a recommendation and we can contact the patient.   Kerin Ransom PA-C 01/19/2020 11:22 AM

## 2020-01-19 NOTE — Telephone Encounter (Signed)
Left message to call to schedule lab work for pre op clearance. Per pre op provider and PharmD pt will need BMET, CBC. I will place the lab orders, just need pt to call back for appt.

## 2020-01-24 NOTE — Telephone Encounter (Signed)
Callback-- please follow up with urology office regarding plans and patient need for follow up labs, thanks!

## 2020-01-24 NOTE — Telephone Encounter (Signed)
Lauren from [facility] calling to follow up on clearance.

## 2020-01-25 ENCOUNTER — Telehealth: Payer: Self-pay | Admitting: Cardiology

## 2020-01-25 DIAGNOSIS — Z01810 Encounter for preprocedural cardiovascular examination: Secondary | ICD-10-CM

## 2020-01-25 LAB — CBC
Hematocrit: 43.5 % (ref 37.5–51.0)
Hemoglobin: 14.8 g/dL (ref 13.0–17.7)
MCH: 30 pg (ref 26.6–33.0)
MCHC: 34 g/dL (ref 31.5–35.7)
MCV: 88 fL (ref 79–97)
Platelets: 194 10*3/uL (ref 150–450)
RBC: 4.94 x10E6/uL (ref 4.14–5.80)
RDW: 12.2 % (ref 11.6–15.4)
WBC: 7.8 10*3/uL (ref 3.4–10.8)

## 2020-01-25 NOTE — Telephone Encounter (Signed)
Spoke with Stanton Kidney at [facility]. She needs lab orders released. Orders released for CBC and BMP per request.

## 2020-01-25 NOTE — Telephone Encounter (Signed)
   Francis Shaw from [facility] at [facility] requesting if she can get order for the lab work order for the pt. She gave fax# [phone]  Please advise

## 2020-01-26 LAB — BASIC METABOLIC PANEL WITH GFR
BUN/Creatinine Ratio: 14 (ref 10–24)
BUN: 13 mg/dL (ref 8–27)
CO2: 22 mmol/L (ref 20–29)
Calcium: 9.2 mg/dL (ref 8.6–10.2)
Chloride: 105 mmol/L (ref 96–106)
Creatinine, Ser: 0.92 mg/dL (ref 0.76–1.27)
GFR calc Af Amer: 92 mL/min/1.73
GFR calc non Af Amer: 80 mL/min/1.73
Glucose: 99 mg/dL (ref 65–99)
Potassium: 4.7 mmol/L (ref 3.5–5.2)
Sodium: 140 mmol/L (ref 134–144)

## 2020-01-26 NOTE — Telephone Encounter (Signed)
   Primary Cardiologist: Kirk Ruths, MD  Chart reviewed as part of pre-operative protocol coverage. Patient was contacted 01/26/2020 in reference to pre-operative risk assessment for pending surgery as outlined below.  Francis Shaw was last seen on 11/20 by Rosaria Ferries.  Since that day, Francis Shaw has done well from a cardiac standpoint.  Therefore, based on ACC/AHA guidelines, the patient would be at acceptable risk for the planned procedure without further cardiovascular testing.   Per Pharmacy recommendations- ok to hold Xarelto for 3 days prior to to procedure.   I will route this recommendation to the requesting party via Epic fax function and remove from pre-op pool.  Please call with questions.  Francis Bellis, NP 01/26/2020, 8:17 AM

## 2020-02-17 DIAGNOSIS — N401 Enlarged prostate with lower urinary tract symptoms: Secondary | ICD-10-CM | POA: Diagnosis not present

## 2020-02-24 DIAGNOSIS — N401 Enlarged prostate with lower urinary tract symptoms: Secondary | ICD-10-CM | POA: Diagnosis not present

## 2020-02-24 DIAGNOSIS — R972 Elevated prostate specific antigen [PSA]: Secondary | ICD-10-CM | POA: Diagnosis not present

## 2020-02-24 DIAGNOSIS — R3912 Poor urinary stream: Secondary | ICD-10-CM | POA: Diagnosis not present

## 2020-03-08 DIAGNOSIS — R972 Elevated prostate specific antigen [PSA]: Secondary | ICD-10-CM | POA: Diagnosis not present

## 2020-03-08 DIAGNOSIS — N401 Enlarged prostate with lower urinary tract symptoms: Secondary | ICD-10-CM | POA: Diagnosis not present

## 2020-03-08 DIAGNOSIS — R3912 Poor urinary stream: Secondary | ICD-10-CM | POA: Diagnosis not present

## 2020-03-12 ENCOUNTER — Other Ambulatory Visit: Payer: Self-pay | Admitting: Family Medicine

## 2020-04-20 ENCOUNTER — Other Ambulatory Visit: Payer: Self-pay | Admitting: Physician Assistant

## 2020-04-20 DIAGNOSIS — I428 Other cardiomyopathies: Secondary | ICD-10-CM

## 2020-04-20 NOTE — Telephone Encounter (Signed)
This is Dr. Crenshaw's pt 

## 2020-05-24 NOTE — Progress Notes (Signed)
I connected with Domani today by telephone and verified that I am speaking with the correct person using two identifiers. Location patient: home Location provider: work Persons participating in the virtual visit: patient, Marine scientist.    I discussed the limitations, risks, security and privacy concerns of performing an evaluation and management service by telephone and the availability of in person appointments. I also discussed with the patient that there may be a patient responsible charge related to this service. The patient expressed understanding and verbally consented to this telephonic visit.    Interactive audio and video telecommunications were attempted between this provider and patient, however failed, due to patient having technical difficulties OR patient did not have access to video capability.  We continued and completed visit with audio only.  Some vital signs may be absent or patient reported.    Subjective:   Francis Shaw is a 79 y.o. male who presents for Medicare Annual/Subsequent preventive examination.  Review of Systems     Cardiac Risk Factors include: advanced age (>29men, >39 women);dyslipidemia;hypertension;male gender     Objective:     Advanced Directives 05/25/2020 05/25/2019 07/15/2017 07/15/2017 06/28/2017 06/28/2017 07/11/2016  Does Patient Have a Medical Advance Directive? Yes Yes Yes Yes Yes Yes Yes  Type of Advance Directive Living will [place];Living will [place];Living will [place];Living will;Out of facility DNR (pink MOST or yellow form) Living will Living will [place];Living will  Does patient want to make changes to medical advance directive? No - Patient declined No - Patient declined No - Patient declined - No - Patient declined - -  Copy of [place] in Chart? - Yes - validated most recent copy scanned in chart (See row information) No - copy requested  Yes - - No - copy requested  Pre-existing out of facility DNR order (yellow form or pink MOST form) - - - - - - -    Current Medications (verified) Outpatient Encounter Medications as of 05/25/2020  Medication Sig  . carvedilol (COREG) 25 MG tablet TAKE 1 TABLET BY MOUTH TWICE A DAY  . fluticasone (FLONASE) 50 MCG/ACT nasal spray Place 2 sprays into both nostrils daily as needed for allergies or rhinitis.  . hydrOXYzine (ATARAX/VISTARIL) 25 MG tablet TAKE 1-2 TABLETS (25-50 MG TOTAL) BY MOUTH AT BEDTIME AS NEEDED.  Marland Kitchen loratadine (CLARITIN) 10 MG tablet TAKE 1 TABLET (10 MG TOTAL) BY MOUTH 2 (TWO) TIMES DAILY AS NEEDED FOR ALLERGIES.  Marland Kitchen losartan (COZAAR) 25 MG tablet Take 1 tablet (25 mg total) by mouth daily.  . Multiple Vitamins-Minerals (CENTRUM SILVER PO) Take 1 tablet by mouth daily.  . rivaroxaban (XARELTO) 20 MG TABS tablet Take 1 tablet (20 mg total) by mouth daily with supper.  . tamsulosin (FLOMAX) 0.4 MG CAPS capsule Take 0.4 mg by mouth daily.   No facility-administered encounter medications on file as of 05/25/2020.    Allergies (verified) Patient has no known allergies.   History: Past Medical History:  Diagnosis Date  . Allergy   . Arthritis    left hip  . Atrial fibrillation ([place])    a. Eliquis initiated 04/2013. b. on Xarelto 2020  . Atrial flutter ([place])   . Constipation 07/15/2017  . Dermatitis 11/22/2012  . Esophageal reflux 04/12/2013  . Hypertension   . Nonischemic cardiomyopathy ([place])    a. presumed to be tachy mediated;  b. 03/2013 Echo: EF 30-35%, diff HK, mild LVH, mildly dil LA.  Marland Kitchen  Pericarditis   . Plantar wart, right foot 03/26/2017  . Skin lesion 11/26/2011   Past Surgical History:  Procedure Laterality Date  . CALCANEAL OSTEOTOMY Right 1993   "stapled it; screwed it" (04/28/2013)  . CALCANEUS HARDWARE REMOVAL Right ~ 1994  . COLONOSCOPY     10-12 years ago; unable to find records  . COLONOSCOPY    . 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 LEFT  HEART CATHETERIZATION WITH CORONARY ANGIOGRAM N/A 09/30/2013   Procedure: LEFT HEART CATHETERIZATION WITH CORONARY ANGIOGRAM;  Surgeon: Burnell Blanks, MD;  Location[facility] CATH LAB;  Service: Cardiovascular;  Laterality: N/A;  . TONSILLECTOMY  1940's  . 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 Stroke Father   . Asthma Mother        low back pain  . Colon cancer Neg Hx   . Esophageal cancer Neg Hx   . Rectal cancer Neg Hx   . Stomach cancer Neg Hx    Social History   Socioeconomic History  . Marital status: Married    Spouse name: Not on file  . Number of children: 2  . Years of education: Not on file  . Highest education level: Not on file  Occupational History  . Occupation: self emp, Journalist, newspaper, to [place], Social research officer, government.  Tobacco Use  . Smoking status: Former Smoker    Packs/day: 1.00    Years: 18.00    Pack years: 18.00    Types: Cigarettes    Quit date: 09/24/1979    Years since quitting: 40.6  . Smokeless tobacco: Never Used  Vaping Use  . Vaping Use: Never used  Substance and Sexual Activity  . Alcohol use: Yes    Alcohol/week: 0.0 standard drinks    Comment: occasionally   . Drug use: No  . Sexual activity: Yes    Partners: Female  Other Topics Concern  . Not on file  Social History Narrative  . Not on file   Social Determinants of Health   Financial Resource Strain: Low Risk   . Difficulty of Paying Living Expenses: Not hard at all  Food Insecurity: No Food Insecurity  . Worried About Charity fundraiser in the Last Year: Never true  . Ran Out of Food in the Last Year: Never true  Transportation Needs: No Transportation Needs  . Lack of Transportation (Medical): No  . Lack of Transportation (Non-Medical): No  Physical Activity:   . Days of Exercise per Week: Not on  file  . Minutes of Exercise per Session: Not on file  Stress:   . Feeling of Stress : Not on file  Social Connections:   . Frequency of Communication with Friends and Family: Not on file  . Frequency of Social Gatherings with Friends and Family: Not on file  . Attends Religious Services: Not on file  . Active Member of Clubs or Organizations: Not on file  . Attends Archivist Meetings: Not on file  . Marital Status: Not on file    Tobacco Counseling Counseling given: Not Answered   Clinical Intake:     Pain : No/denies pain             Activities of Daily Living In your present state of health, do you have any difficulty performing  the following activities: 05/25/2020  Hearing? N  Vision? N  Difficulty concentrating or making decisions? N  Walking or climbing stairs? N  Dressing or bathing? N  Doing errands, shopping? N  Preparing Food and eating ? N  Using the Toilet? N  In the past six months, have you accidently leaked urine? N  Do you have problems with loss of bowel control? N  Managing your Medications? N  Managing your Finances? N  Housekeeping or managing your Housekeeping? N  Some recent data might be hidden    Patient Care Team: Mosie Lukes, MD as PCP - General (Family Medicine) Stanford Breed Denice Bors, MD as PCP - Cardiology (Cardiology) Stanford Breed Denice Bors, MD as Consulting Physician (Cardiology) Festus Aloe, MD as Consulting Physician (Urology)  Indicate any recent Medical Services you may have received from other than [facility] providers in the past year (date may be approximate).     Assessment:   This is a routine wellness examination for Francis Shaw.   Dietary issues and exercise activities discussed: Current Exercise Habits: The patient does not participate in regular exercise at present, Exercise limited by: None identified Diet (meal preparation, eat out, water intake, caffeinated beverages, dairy products, fruits and vegetables):  well balanced   Goals    .  Healthy Lifestyle (pt-stated)      Visit your primary care provider regularly.  Make time for family and friends.  Healthy relationships are important.  Take medications as directed by your provider.  Maintain a healthy weight and a trim waistline.  Eat healthy meals and snacks, rich in fruits, vegetables, whole grains and lean proteins.  Aim for 150 minutes of moderate physical activity each week.  Avoid alcohol or drink in moderation.  Manage stress through prayer, meditation or mindful relaxation.  Get seven to nine hours of quality sleep each night.         Depression Screen PHQ 2/9 Scores 05/25/2020 05/25/2019 07/15/2017 07/15/2017 07/11/2016 05/01/2015  PHQ - 2 Score 0 0 0 0 0 0    Fall Risk Fall Risk  05/25/2020 05/25/2019 07/15/2017 07/15/2017 07/11/2016  Falls in the past year? 0 0 No No No  Number falls in past yr: 0 - - - -  Injury with Fall? 0 - - - -  Follow up Education provided;Falls prevention discussed - - - -    Any stairs in or around the home? Yes  If so, are there any without handrails? No  Home free of loose throw rugs in walkways, pet beds, electrical cords, etc? Yes  Adequate lighting in your home to reduce risk of falls? Yes   ASSISTIVE DEVICES UTILIZED TO PREVENT FALLS: none needed per pt.    Cognitive Function: Ad8 score reviewed for issues:  Issues making decisions:no  Less interest in hobbies / activities:no  Repeats questions, stories (family complaining):no  Trouble using ordinary gadgets (microwave, computer, phone):no  Forgets the month or year: no  Mismanaging finances: no  Remembering appts:no  Daily problems with thinking and/or memory:no Ad8 score is=0     MMSE - Mini Mental State Exam 07/11/2016 05/01/2015  Orientation to time 5 5  Orientation to Place 5 5  Registration 3 3  Attention/ Calculation 5 5  Recall 2 2  Language- name 2 objects 2 2  Language- repeat 1 1  Language- follow 3  step command 3 3  Language- read & follow direction 1 1  Write a sentence 1 1  Copy design 1 1  Total score  29 29        Immunizations Immunization History  Administered Date(s) Administered  . Influenza, High Dose Seasonal PF 07/23/2018  . PFIZER SARS-COV-2 Vaccination 10/03/2019, 10/24/2019  . Pneumococcal Conjugate-13 05/01/2015  . Pneumococcal Polysaccharide-23 03/05/2012  . Td 09/23/2006  . Tdap 03/05/2012  . Zoster 10/24/2013  . Zoster Recombinat (Shingrix) 05/21/2018    TDAP status: Up to date Pneumococcal vaccine status: Up to date Covid-19 vaccine status: Completed vaccines  Qualifies for Shingles Vaccine? Yes   Zostavax completed Yes     Screening Tests Health Maintenance  Topic Date Due  . Hepatitis C Screening  Never done  . FOOT EXAM  Never done  . OPHTHALMOLOGY EXAM  Never done  . HEMOGLOBIN A1C  07/22/2018  . TETANUS/TDAP  03/05/2022  . COVID-19 Vaccine  Completed  . PNA vac Low Risk Adult  Completed  . INFLUENZA VACCINE  Discontinued    Health Maintenance  Health Maintenance Due  Topic Date Due  . Hepatitis C Screening  Never done  . FOOT EXAM  Never done  . OPHTHALMOLOGY EXAM  Never done  . HEMOGLOBIN A1C  07/22/2018    Colorectal cancer screening: No longer required.   Lung Cancer Screening: (Low Dose CT Chest recommended if Age 35-80 years, 30 pack-year currently smoking OR have quit w/in 15years.) does not qualify.   Lung Cancer Screening Referral: n/a  Additional Screening:   Vision Screening: Recommended annual ophthalmology exams for early detection of glaucoma and other disorders of the eye. Is the patient up to date with their annual eye exam?  Yes  Dr.McCuen   Dental Screening: Recommended annual dental exams for proper oral hygiene  Community Resource Referral / Chronic Care Management: CRR required this visit?  No   CCM required this visit?  No      Plan:    Continue to eat heart healthy diet (full of fruits,  vegetables, whole grains, lean protein, water--limit salt, fat, and sugar intake) and increase physical activity as tolerated.  Continue doing brain stimulating activities (puzzles, reading, adult coloring books, staying active) to keep memory sharp.     I have personally reviewed and noted the following in the patient's chart:   . Medical and social history . Use of alcohol, tobacco or illicit drugs  . Current medications and supplements . Functional ability and status . Nutritional status . Physical activity . Advanced directives . List of other physicians . Hospitalizations, surgeries, and ER visits in previous 12 months . Vitals . Screenings to include cognitive, depression, and falls . Referrals and appointments  In addition, I have reviewed and discussed with patient certain preventive protocols, quality metrics, and best practice recommendations. A written personalized care plan for preventive services as well as general preventive health recommendations were provided to patient.   Due to this being a telephonic visit, the after visit summary with patients personalized plan was offered to patient via mail or my-chart. Patient would like to access on my-chart.  Naaman Plummer [place], [place]   05/25/2020

## 2020-05-25 ENCOUNTER — Ambulatory Visit (INDEPENDENT_AMBULATORY_CARE_PROVIDER_SITE_OTHER): Payer: Medicare HMO | Admitting: *Deleted

## 2020-05-25 ENCOUNTER — Other Ambulatory Visit: Payer: Self-pay

## 2020-05-25 ENCOUNTER — Encounter: Payer: Self-pay | Admitting: *Deleted

## 2020-05-25 DIAGNOSIS — Z0000 Encounter for general adult medical examination without abnormal findings: Secondary | ICD-10-CM | POA: Diagnosis not present

## 2020-05-25 NOTE — Patient Instructions (Signed)
Continue to eat heart healthy diet (full of fruits, vegetables, whole grains, lean protein, water--limit salt, fat, and sugar intake) and increase physical activity as tolerated.  Continue doing brain stimulating activities (puzzles, reading, adult coloring books, staying active) to keep memory sharp.     Francis Shaw , Thank you for taking time to come for your Medicare Wellness Visit. I appreciate your ongoing commitment to your health goals. Please review the following plan we discussed and let me know if I can assist you in the future.   These are the goals we discussed: Goals    .  Healthy Lifestyle (pt-stated)      Visit your primary care provider regularly.  Make time for family and friends.  Healthy relationships are important.  Take medications as directed by your provider.  Maintain a healthy weight and a trim waistline.  Eat healthy meals and snacks, rich in fruits, vegetables, whole grains and lean proteins.  Aim for 150 minutes of moderate physical activity each week.  Avoid alcohol or drink in moderation.  Manage stress through prayer, meditation or mindful relaxation.  Get seven to nine hours of quality sleep each night.          This is a list of the screening recommended for you and due dates:  Health Maintenance  Topic Date Due  .  Hepatitis C: One time screening is recommended by Center for Disease Control  (CDC) for  adults born from [birth year] through 1964.   Never done  . Complete foot exam   Never done  . Eye exam for diabetics  Never done  . Hemoglobin A1C  07/22/2018  . Tetanus Vaccine  03/05/2022  . COVID-19 Vaccine  Completed  . Pneumonia vaccines  Completed  . Flu Shot  Discontinued    Preventive Care 39 Years and Older, Male Preventive care refers to lifestyle choices and visits with your health care provider that can promote health and wellness. This includes:  A yearly physical exam. This is also called an annual well check.  Regular dental  and eye exams.  Immunizations.  Screening for certain conditions.  Healthy lifestyle choices, such as diet and exercise. What can I expect for my preventive care visit? Physical exam Your health care provider will check:  Height and weight. These may be used to calculate body mass index (BMI), which is a measurement that tells if you are at a healthy weight.  Heart rate and blood pressure.  Your skin for abnormal spots. Counseling Your health care provider may ask you questions about:  Alcohol, tobacco, and drug use.  Emotional well-being.  Home and relationship well-being.  Sexual activity.  Eating habits.  History of falls.  Memory and ability to understand (cognition).  Work and work Statistician. What immunizations do I need?  Influenza (flu) vaccine  This is recommended every year. Tetanus, diphtheria, and pertussis (Tdap) vaccine  You may need a Td booster every 10 years. Varicella (chickenpox) vaccine  You may need this vaccine if you have not already been vaccinated. Zoster (shingles) vaccine  You may need this after age 76. Pneumococcal conjugate (PCV13) vaccine  One dose is recommended after age 3. Pneumococcal polysaccharide (PPSV23) vaccine  One dose is recommended after age 74. Measles, mumps, and rubella (MMR) vaccine  You may need at least one dose of MMR if you were born in 1957 or later. You may also need a second dose. Meningococcal conjugate (MenACWY) vaccine  You may need this if you have  certain conditions. Hepatitis A vaccine  You may need this if you have certain conditions or if you travel or work in places where you may be exposed to hepatitis A. Hepatitis B vaccine  You may need this if you have certain conditions or if you travel or work in places where you may be exposed to hepatitis B. Haemophilus influenzae type b (Hib) vaccine  You may need this if you have certain conditions. You may receive vaccines as individual  doses or as more than one vaccine together in one shot (combination vaccines). Talk with your health care provider about the risks and benefits of combination vaccines. What tests do I need? Blood tests  Lipid and cholesterol levels. These may be checked every 5 years, or more frequently depending on your overall health.  Hepatitis C test.  Hepatitis B test. Screening  Lung cancer screening. You may have this screening every year starting at age 91 if you have a 30-pack-year history of smoking and currently smoke or have quit within the past 15 years.  Colorectal cancer screening. All adults should have this screening starting at age 47 and continuing until age 69. Your health care provider may recommend screening at age 9 if you are at increased risk. You will have tests every 1-10 years, depending on your results and the type of screening test.  Prostate cancer screening. Recommendations will vary depending on your family history and other risks.  Diabetes screening. This is done by checking your blood sugar (glucose) after you have not eaten for a while (fasting). You may have this done every 1-3 years.  Abdominal aortic aneurysm (AAA) screening. You may need this if you are a current or former smoker.  Sexually transmitted disease (STD) testing. Follow these instructions at home: Eating and drinking  Eat a diet that includes fresh fruits and vegetables, whole grains, lean protein, and low-fat dairy products. Limit your intake of foods with high amounts of sugar, saturated fats, and salt.  Take vitamin and mineral supplements as recommended by your health care provider.  Do not drink alcohol if your health care provider tells you not to drink.  If you drink alcohol: ? Limit how much you have to 0-2 drinks a day. ? Be aware of how much alcohol is in your drink. In the [place] one drink equals one 12 oz bottle of beer (355 mL), one 5 oz glass of wine (148 mL), or one 1 oz glass of  hard liquor (44 mL). Lifestyle  Take daily care of your teeth and gums.  Stay active. Exercise for at least 30 minutes on 5 or more days each week.  Do not use any products that contain nicotine or tobacco, such as cigarettes, e-cigarettes, and chewing tobacco. If you need help quitting, ask your health care provider.  If you are sexually active, practice safe sex. Use a condom or other form of protection to prevent STIs (sexually transmitted infections).  Talk with your health care provider about taking a low-dose aspirin or statin. What's next?  Visit your health care provider once a year for a well check visit.  Ask your health care provider how often you should have your eyes and teeth checked.  Stay up to date on all vaccines. This information is not intended to replace advice given to you by your health care provider. Make sure you discuss any questions you have with your health care provider. Document Revised: 09/03/2018 Document Reviewed: 09/03/2018 Elsevier Patient Education  2020 Elsevier  Inc.

## 2020-06-13 DIAGNOSIS — M1990 Unspecified osteoarthritis, unspecified site: Secondary | ICD-10-CM | POA: Diagnosis not present

## 2020-06-13 DIAGNOSIS — J301 Allergic rhinitis due to pollen: Secondary | ICD-10-CM | POA: Diagnosis not present

## 2020-06-13 DIAGNOSIS — D6869 Other thrombophilia: Secondary | ICD-10-CM | POA: Diagnosis not present

## 2020-06-13 DIAGNOSIS — Z7901 Long term (current) use of anticoagulants: Secondary | ICD-10-CM | POA: Diagnosis not present

## 2020-06-13 DIAGNOSIS — Z008 Encounter for other general examination: Secondary | ICD-10-CM | POA: Diagnosis not present

## 2020-06-13 DIAGNOSIS — I4891 Unspecified atrial fibrillation: Secondary | ICD-10-CM | POA: Diagnosis not present

## 2020-06-13 DIAGNOSIS — Z87891 Personal history of nicotine dependence: Secondary | ICD-10-CM | POA: Diagnosis not present

## 2020-06-13 DIAGNOSIS — I10 Essential (primary) hypertension: Secondary | ICD-10-CM | POA: Diagnosis not present

## 2020-06-13 DIAGNOSIS — Z96649 Presence of unspecified artificial hip joint: Secondary | ICD-10-CM | POA: Diagnosis not present

## 2020-06-13 DIAGNOSIS — K5900 Constipation, unspecified: Secondary | ICD-10-CM | POA: Diagnosis not present

## 2020-07-14 ENCOUNTER — Other Ambulatory Visit: Payer: Self-pay | Admitting: Physician Assistant

## 2020-08-02 ENCOUNTER — Other Ambulatory Visit: Payer: Self-pay | Admitting: Physician Assistant

## 2020-08-02 NOTE — Telephone Encounter (Signed)
Prescription refill request for Xarelto received.   Last office visit: [place], 07/30/2019 Weight: 83 kg  Age: 79 yo Scr: 0.92, 01/25/2020 CrCl: 78 ml/min   Pt is overdue for an office visit.

## 2020-08-02 NOTE — Telephone Encounter (Signed)
Pt has an appointment scheduled to see Dr. Stanford Breed on 10/18/2020. Will send in Xarelto refill so pt does not run out of medication.

## 2020-08-02 NOTE — Telephone Encounter (Signed)
Called pt and informed he needs to schedule an appointment for future refills. Transferred him to the mainline to schedule an appointment.

## 2020-09-18 DIAGNOSIS — N401 Enlarged prostate with lower urinary tract symptoms: Secondary | ICD-10-CM | POA: Diagnosis not present

## 2020-09-25 DIAGNOSIS — N401 Enlarged prostate with lower urinary tract symptoms: Secondary | ICD-10-CM | POA: Diagnosis not present

## 2020-09-25 DIAGNOSIS — R3912 Poor urinary stream: Secondary | ICD-10-CM | POA: Diagnosis not present

## 2020-10-02 ENCOUNTER — Ambulatory Visit: Payer: Medicare HMO | Admitting: Family Medicine

## 2020-10-05 ENCOUNTER — Other Ambulatory Visit: Payer: Self-pay | Admitting: Physician Assistant

## 2020-10-05 ENCOUNTER — Other Ambulatory Visit: Payer: Self-pay | Admitting: Cardiology

## 2020-10-05 DIAGNOSIS — I428 Other cardiomyopathies: Secondary | ICD-10-CM

## 2020-10-05 NOTE — Telephone Encounter (Signed)
This is Dr. Crenshaw's pt 

## 2020-10-11 NOTE — Progress Notes (Signed)
HPI: FU atrial fibrillation/atrial flutter. Echocardiogram in July of 2014 showed moderate left ventricular enlargement, mild left ventricular hypertrophy and an ejection fraction of 30-35% with diffuse hypokinesis. There was mild left atrial enlargement. TSH in July of 2014 normal. When I initially saw the patient in August of 2014 he was in atrial flutter at a rate of 150 (also with atrial fibrillation on telemetry and flutter ablation not pursued). Patient was scheduled for elective cardioversion but when he came to the procedure he was in sinus rhythm. Cardiac catheterization January 2015 showed no obstructive coronary disease and ejection fraction 35%. Patient noted to be in recurrent atrial fibrillation at last office visit.  Plan was rate control.  Echocardiogram October 2018 showed ejection fraction 45 to 50%, moderate diastolic dysfunction.  Monitor October 2018 showed patient had converted back to sinus rhythm and there were PVCs noted.  Since he was last seen,he has mild dyspnea on exertion.  No orthopnea, PND, pedal edema, palpitations, syncope, chest pain or bleeding.  Current Outpatient Medications  Medication Sig Dispense Refill  . carvedilol (COREG) 25 MG tablet TAKE 1 TABLET BY MOUTH TWICE A DAY 180 tablet 1  . Cholecalciferol (VITAMIN D3 PO) Take by mouth daily.    . fluticasone (FLONASE) 50 MCG/ACT nasal spray Place 2 sprays into both nostrils daily as needed for allergies or rhinitis.    . hydrOXYzine (ATARAX/VISTARIL) 25 MG tablet TAKE 1-2 TABLETS (25-50 MG TOTAL) BY MOUTH AT BEDTIME AS NEEDED. 30 tablet 1  . loratadine (CLARITIN) 10 MG tablet TAKE 1 TABLET (10 MG TOTAL) BY MOUTH 2 (TWO) TIMES DAILY AS NEEDED FOR ALLERGIES. 180 tablet 1  . losartan (COZAAR) 25 MG tablet TAKE 1 TABLET BY MOUTH EVERY DAY 90 tablet 0  . Multiple Vitamins-Minerals (CENTRUM SILVER PO) Take 1 tablet by mouth daily.    . tamsulosin (FLOMAX) 0.4 MG CAPS capsule Take 0.4 mg by mouth daily.  11  .  XARELTO 20 MG TABS tablet TAKE 1 TABLET (20 MG TOTAL) BY MOUTH DAILY WITH SUPPER. 90 tablet 0   No current facility-administered medications for this visit.     Past Medical History:  Diagnosis Date  . Allergy   . Arthritis    left hip  . Atrial fibrillation ([place])    a. Eliquis initiated 04/2013. b. on Xarelto 2020  . Atrial flutter ([place])   . Constipation 07/15/2017  . Dermatitis 11/22/2012  . Esophageal reflux 04/12/2013  . Hypertension   . Nonischemic cardiomyopathy ([place])    a. presumed to be tachy mediated;  b. 03/2013 Echo: EF 30-35%, diff HK, mild LVH, mildly dil LA.  Marland Kitchen Pericarditis   . Plantar wart, right foot 03/26/2017  . Skin lesion 11/26/2011    Past Surgical History:  Procedure Laterality Date  . CALCANEAL OSTEOTOMY Right 1993   "stapled it; screwed it" (04/28/2013)  . CALCANEUS HARDWARE REMOVAL Right ~ 1994  . COLONOSCOPY     10-12 years ago; unable to find records  . COLONOSCOPY    . 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 LEFT HEART CATHETERIZATION WITH CORONARY ANGIOGRAM N/A 09/30/2013   Procedure: LEFT HEART CATHETERIZATION WITH CORONARY ANGIOGRAM;  Surgeon: Burnell Blanks, MD;  Location[facility] CATH LAB;  Service: Cardiovascular;  Laterality: N/A;  . TONSILLECTOMY  1940's  . 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   Procedure: TOTAL HIP ARTHROPLASTY ANTERIOR APPROACH;  Surgeon: Frederik Pear, MD;  Location: [facility];  Service: Orthopedics;  Laterality: Left;  Social History   Socioeconomic History  . Marital status: Married    Spouse name: Not on file  . Number of children: 2  . Years of education: Not on file  . Highest education level: Not on file  Occupational History  . Occupation: self emp, Journalist, newspaper, to [place], Social research officer, government.  Tobacco Use  . Smoking status: Former Smoker    Packs/day: 1.00    Years: 18.00    Pack years: 18.00    Types: Cigarettes    Quit date: 09/24/1979    Years since quitting: 41.0  . Smokeless tobacco: Never Used  Vaping  Use  . Vaping Use: Never used  Substance and Sexual Activity  . Alcohol use: Yes    Alcohol/week: 0.0 standard drinks    Comment: occasionally   . Drug use: No  . Sexual activity: Yes    Partners: Female  Other Topics Concern  . Not on file  Social History Narrative  . Not on file   Social Determinants of Health   Financial Resource Strain: Low Risk   . Difficulty of Paying Living Expenses: Not hard at all  Food Insecurity: No Food Insecurity  . Worried About Charity fundraiser in the Last Year: Never true  . Ran Out of Food in the Last Year: Never true  Transportation Needs: No Transportation Needs  . Lack of Transportation (Medical): No  . Lack of Transportation (Non-Medical): No  Physical Activity: Not on file  Stress: Not on file  Social Connections: Not on file  Intimate Partner Violence: Not on file    Family History  Problem Relation Age of Onset  . Stroke Father   . Asthma Mother        low back pain  . Colon cancer Neg Hx   . Esophageal cancer Neg Hx   . Rectal cancer Neg Hx   . Stomach cancer Neg Hx     ROS: no fevers or chills, productive cough, hemoptysis, dysphasia, odynophagia, melena, hematochezia, dysuria, hematuria, rash, seizure activity, orthopnea, PND, pedal edema, claudication. Remaining systems are negative.  Physical Exam: Well-developed well-nourished in no acute distress.  Skin is warm and dry.  HEENT is normal.  Neck is supple.  Chest is clear to auscultation with normal expansion.  Cardiovascular exam is irregular Abdominal exam nontender or distended. No masses palpated. Extremities show no edema. neuro grossly intact  ECG-atrial fibrillation at a rate of 72, left bundle branch block.  Personally reviewed  A/P  1 paroxysmal atrial fibrillation-patient is back in atrial fibrillation today.  However his heart rate is controlled and he is asymptomatic compared to previous.  We will continue carvedilol for rate control.  Continue  Xarelto.  Check hemoglobin and renal function.  2 history of atrial flutter ablation-we have not offered ablation given history of concurrent atrial fibrillation.  3 nonischemic cardiomyopathy-continue ARB (increase losartan to 50 mg daily) and beta-blocker.  Kirk [facility], MD

## 2020-10-18 ENCOUNTER — Ambulatory Visit: Payer: Medicare HMO | Admitting: Cardiology

## 2020-10-18 ENCOUNTER — Other Ambulatory Visit: Payer: Self-pay

## 2020-10-18 ENCOUNTER — Encounter: Payer: Self-pay | Admitting: Cardiology

## 2020-10-18 VITALS — BP 130/70 | HR 72 | Ht 71.0 in | Wt 188.0 lb

## 2020-10-18 DIAGNOSIS — I10 Essential (primary) hypertension: Secondary | ICD-10-CM

## 2020-10-18 DIAGNOSIS — I428 Other cardiomyopathies: Secondary | ICD-10-CM | POA: Diagnosis not present

## 2020-10-18 DIAGNOSIS — I482 Chronic atrial fibrillation, unspecified: Secondary | ICD-10-CM | POA: Diagnosis not present

## 2020-10-18 MED ORDER — LOSARTAN POTASSIUM 50 MG PO TABS: 50.0000 mg | ORAL_TABLET | Freq: Every day | ORAL | 3 refills | Status: DC

## 2020-10-18 NOTE — Patient Instructions (Signed)
Medication Instructions:   INCREASE LOSARTAN TO 50 MG ONCE DAILY= 2 OF THE 25 MG TABLETS ONCE DAILY  *If you need a refill on your cardiac medications before your next appointment, please call your pharmacy*   Lab Work:  Your physician recommends that you return for lab work in: ONE WEEK  If you have labs (blood work) drawn today and your tests are completely normal, you will receive your results only by: MyChart Message (if you have MyChart) OR A paper copy in the mail If you have any lab test that is abnormal or we need to change your treatment, we will call you to review the results.  Follow-Up: At [facility], you and your health needs are our priority.  As part of our continuing mission to provide you with exceptional heart care, we have created designated Provider Care Teams.  These Care Teams include your primary Cardiologist (physician) and Advanced Practice Providers (APPs -  Physician Assistants and Nurse Practitioners) who all work together to provide you with the care you need, when you need it.  We recommend signing up for the patient portal called "MyChart".  Sign up information is provided on this After Visit Summary.  MyChart is used to connect with patients for Virtual Visits (Telemedicine).  Patients are able to view lab/test results, encounter notes, upcoming appointments, etc.  Non-urgent messages can be sent to your provider as well.   To learn more about what you can do with MyChart, go to [email or web address].    Your next appointment:   12 month(s)  The format for your next appointment:   In Person  Provider:   Brian Crenshaw, MD    

## 2020-10-25 DIAGNOSIS — I482 Chronic atrial fibrillation, unspecified: Secondary | ICD-10-CM | POA: Diagnosis not present

## 2020-10-26 LAB — BASIC METABOLIC PANEL
BUN/Creatinine Ratio: 12 (ref 10–24)
BUN: 11 mg/dL (ref 8–27)
CO2: 27 mmol/L (ref 20–29)
Calcium: 9.6 mg/dL (ref 8.6–10.2)
Chloride: 105 mmol/L (ref 96–106)
Creatinine, Ser: 0.89 mg/dL (ref 0.76–1.27)
GFR calc Af Amer: 95 mL/min/{1.73_m2} (ref >59)
GFR calc non Af Amer: 82 mL/min/{1.73_m2} (ref >59)
Glucose: 81 mg/dL (ref 65–99)
Potassium: 4.8 mmol/L (ref 3.5–5.2)
Sodium: 143 mmol/L (ref 134–144)

## 2020-10-26 LAB — CBC
Hematocrit: 46 % (ref 37.5–51.0)
Hemoglobin: 15.8 g/dL (ref 13.0–17.7)
MCH: 30.7 pg (ref 26.6–33.0)
MCHC: 34.3 g/dL (ref 31.5–35.7)
MCV: 89 fL (ref 79–97)
Platelets: 173 10*3/uL (ref 150–450)
RBC: 5.15 x10E6/uL (ref 4.14–5.80)
RDW: 12.2 % (ref 11.6–15.4)
WBC: 7.3 10*3/uL (ref 3.4–10.8)

## 2020-11-06 DIAGNOSIS — R3912 Poor urinary stream: Secondary | ICD-10-CM | POA: Diagnosis not present

## 2020-11-06 DIAGNOSIS — N401 Enlarged prostate with lower urinary tract symptoms: Secondary | ICD-10-CM | POA: Diagnosis not present

## 2020-11-08 ENCOUNTER — Other Ambulatory Visit: Payer: Self-pay | Admitting: Physician Assistant

## 2020-11-08 NOTE — Telephone Encounter (Signed)
12m, 85,3kg, scr 0.89 10/25/20, ccr 83, lovw/crenshaw 10/18/20

## 2020-11-15 ENCOUNTER — Other Ambulatory Visit: Payer: Self-pay | Admitting: Urology

## 2020-12-14 ENCOUNTER — Telehealth: Payer: Self-pay | Admitting: Cardiology

## 2020-12-14 ENCOUNTER — Encounter (HOSPITAL_BASED_OUTPATIENT_CLINIC_OR_DEPARTMENT_OTHER): Payer: Self-pay | Admitting: Urology

## 2020-12-14 ENCOUNTER — Other Ambulatory Visit: Payer: Self-pay

## 2020-12-14 NOTE — Telephone Encounter (Signed)
Patient with diagnosis of afib on Xarelto for anticoagulation.    Procedure: Urolift Date of procedure: 12/19/20  CHA2DS2-VASc Score = 4  This indicates a 4.8% annual risk of stroke. The patient's score is based upon: CHF History: Yes HTN History: Yes Diabetes History: No Stroke History: No Vascular Disease History: No Age Score: 2 Gender Score: 0  CrCl 83 mL/min Platelet count 173K  Per office protocol, patient can hold Xarelto for 3 days prior to procedure as requested.    Patient should restart Xarelto on the evening of procedure or day after, at discretion of procedure MD.

## 2020-12-14 NOTE — Telephone Encounter (Signed)
Clinical pharmacist to review Xarelto 

## 2020-12-14 NOTE — Telephone Encounter (Signed)
   [facility] Pre-operative Risk Assessment    HEARTCARE STAFF: - Please ensure there is not already an duplicate clearance open for this procedure. - Under Visit Info/Reason for Call, type in Other and utilize the format Clearance MM/DD/YY or Clearance TBD. Do not use dashes or single digits. - If request is for dental extraction, please clarify the # of teeth to be extracted.  Request for surgical clearance:  1. What type of surgery is being performed?  Urolift   2. When is this surgery scheduled? 12/19/20  3. What type of clearance is required (medical clearance vs. Pharmacy clearance to hold med vs. Both)? Both  4. Are there any medications that need to be held prior to surgery and how long? Xarelto 3 days   5. Practice name and name of physician performing surgery? Dr. Junious Silk, [facility]  6. What is the office phone number? [phone]   7.   What is the office fax number? [phone]  8.   Anesthesia type (None, local, MAC, general) ? General    Johnna Acosta 12/14/2020, 12:38 PM  _________________________________________________________________   (provider comments below)

## 2020-12-14 NOTE — Progress Notes (Signed)
Spoke w/ via phone for pre-op interview---pt Lab needs dos---- I stat               Lab results------see below COVID test ------12-15-2020 1015 Arrive at -------530 am 12-19-2020 NPO after MN NO Solid Food.  Clear liquids from MN until---430 am then npo Med rec completed Medications to take morning of surgery -----carvedilol Diabetic medication -----n/a Patient instructed to bring photo id and insurance card day of surgery Patient aware to have Driver (ride ) / caregiver  Wife nedine will stay   for 24 hours after surgery  Patient Special Instructions -----none Pre-Op special Istructions -----non Patient verbalized understanding of instructions that were given at this phone interview. Patient denies shortness of breath, chest pain, fever, cough at this phone interview.  lov dr Stanford Breed 10-18-2020 epic Pt instructions to stop xarelto 3 days before surgery madison yates [facility] 12-14-2020 chart/epic (patient aware to stop xarelto 3 days before surgery) Echo 07-03-2017 epic holter monitor 10-03-2016 epic Ekg 10-18-2020 epic

## 2020-12-15 ENCOUNTER — Other Ambulatory Visit (HOSPITAL_COMMUNITY)
Admission: RE | Admit: 2020-12-15 | Discharge: 2020-12-15 | Disposition: A | Discharge status: Home / Self Care | Payer: Medicare HMO | Source: Ambulatory Visit | Attending: Urology | Admitting: Urology

## 2020-12-15 DIAGNOSIS — Z20822 Contact with and (suspected) exposure to covid-19: Secondary | ICD-10-CM | POA: Insufficient documentation

## 2020-12-15 DIAGNOSIS — Z01812 Encounter for preprocedural laboratory examination: Secondary | ICD-10-CM | POA: Diagnosis not present

## 2020-12-15 LAB — SARS CORONAVIRUS 2 (TAT 6-24 HRS): SARS Coronavirus 2: NEGATIVE

## 2020-12-15 NOTE — Progress Notes (Signed)
Pt has cardiac telephone clearance by Almyra Deforest PA dated 12-15-2020 in epic/ chart.

## 2020-12-15 NOTE — Telephone Encounter (Signed)
   Primary Cardiologist: [facility], MD  Chart reviewed as part of pre-operative protocol coverage. Patient was contacted 12/15/2020 in reference to pre-operative risk assessment for pending surgery as outlined below.  Francis Shaw was last seen on 10/18/2020 by Dr. Stanford Breed.  Since that day, Francis Shaw has done very well without any chest pain or shortness of breath.  He can accomplish more than 4 METS activity without any issue.   Therefore, based on ACC/AHA guidelines, the patient would be at acceptable risk for the planned procedure without further cardiovascular testing.   Per our clinical pharmacist, he may hold Xarelto for 3 days prior to the procedure and restart as soon as possible after the procedure at the surgeon's discretion.  The patient was advised that if he develops new symptoms prior to surgery to contact our office to arrange for a follow-up visit, and he verbalized understanding.  I will route this recommendation to the requesting party via Epic fax function and remove from pre-op pool. Please call with questions.  [place], [place] 12/15/2020, 9:17 AM

## 2020-12-19 ENCOUNTER — Ambulatory Visit (HOSPITAL_BASED_OUTPATIENT_CLINIC_OR_DEPARTMENT_OTHER)
Admission: RE | Admit: 2020-12-19 | Discharge: 2020-12-19 | Disposition: A | Discharge status: Home / Self Care | Payer: Medicare HMO | Attending: Urology | Admitting: Urology

## 2020-12-19 ENCOUNTER — Encounter (HOSPITAL_BASED_OUTPATIENT_CLINIC_OR_DEPARTMENT_OTHER)
Admission: RE | Disposition: A | Discharge status: Home / Self Care | Payer: Self-pay | Source: Home / Self Care | Attending: Urology

## 2020-12-19 ENCOUNTER — Ambulatory Visit (HOSPITAL_BASED_OUTPATIENT_CLINIC_OR_DEPARTMENT_OTHER): Payer: Medicare HMO | Admitting: Anesthesiology

## 2020-12-19 ENCOUNTER — Encounter (HOSPITAL_BASED_OUTPATIENT_CLINIC_OR_DEPARTMENT_OTHER): Payer: Self-pay | Admitting: Urology

## 2020-12-19 ENCOUNTER — Other Ambulatory Visit: Payer: Self-pay

## 2020-12-19 DIAGNOSIS — Z89021 Acquired absence of right finger(s): Secondary | ICD-10-CM | POA: Diagnosis not present

## 2020-12-19 DIAGNOSIS — I4891 Unspecified atrial fibrillation: Secondary | ICD-10-CM | POA: Diagnosis not present

## 2020-12-19 DIAGNOSIS — I110 Hypertensive heart disease with heart failure: Secondary | ICD-10-CM | POA: Diagnosis not present

## 2020-12-19 DIAGNOSIS — I428 Other cardiomyopathies: Secondary | ICD-10-CM | POA: Diagnosis not present

## 2020-12-19 DIAGNOSIS — I5022 Chronic systolic (congestive) heart failure: Secondary | ICD-10-CM | POA: Diagnosis not present

## 2020-12-19 DIAGNOSIS — Z87891 Personal history of nicotine dependence: Secondary | ICD-10-CM | POA: Diagnosis not present

## 2020-12-19 DIAGNOSIS — Z96642 Presence of left artificial hip joint: Secondary | ICD-10-CM | POA: Diagnosis not present

## 2020-12-19 DIAGNOSIS — R3912 Poor urinary stream: Secondary | ICD-10-CM | POA: Diagnosis not present

## 2020-12-19 DIAGNOSIS — Z79899 Other long term (current) drug therapy: Secondary | ICD-10-CM | POA: Diagnosis not present

## 2020-12-19 DIAGNOSIS — Z7901 Long term (current) use of anticoagulants: Secondary | ICD-10-CM | POA: Insufficient documentation

## 2020-12-19 DIAGNOSIS — I4892 Unspecified atrial flutter: Secondary | ICD-10-CM | POA: Diagnosis not present

## 2020-12-19 DIAGNOSIS — N138 Other obstructive and reflux uropathy: Secondary | ICD-10-CM

## 2020-12-19 DIAGNOSIS — I10 Essential (primary) hypertension: Secondary | ICD-10-CM | POA: Diagnosis not present

## 2020-12-19 DIAGNOSIS — N401 Enlarged prostate with lower urinary tract symptoms: Secondary | ICD-10-CM

## 2020-12-19 HISTORY — PX: CYSTOSCOPY WITH INSERTION OF UROLIFT: SHX6678

## 2020-12-19 HISTORY — DX: Abnormal electrocardiogram (ECG) (EKG): R94.31

## 2020-12-19 HISTORY — DX: Presence of spectacles and contact lenses: Z97.3

## 2020-12-19 LAB — POCT I-STAT, CHEM 8
BUN: 11 mg/dL (ref 8–23)
Calcium, Ion: 1.31 mmol/L (ref 1.15–1.40)
Chloride: 105 mmol/L (ref 98–111)
Creatinine, Ser: 0.8 mg/dL (ref 0.61–1.24)
Glucose, Bld: 122 mg/dL — ABNORMAL HIGH (ref 70–99)
HCT: 43 % (ref 39.0–52.0)
Hemoglobin: 14.6 g/dL (ref 13.0–17.0)
Potassium: 4.4 mmol/L (ref 3.5–5.1)
Sodium: 142 mmol/L (ref 135–145)
TCO2: 25 mmol/L (ref 22–32)

## 2020-12-19 SURGERY — CYSTOSCOPY WITH INSERTION OF UROLIFT
Anesthesia: General | Site: Renal

## 2020-12-19 MED ORDER — CEFAZOLIN SODIUM-DEXTROSE 2-4 GM/100ML-% IV SOLN
2.0000 g | Freq: Once | INTRAVENOUS | Status: AC
  Administered 2020-12-19: 2 g via INTRAVENOUS

## 2020-12-19 MED ORDER — DEXAMETHASONE SODIUM PHOSPHATE 10 MG/ML IJ SOLN
INTRAMUSCULAR | Status: AC
  Filled 2020-12-19: qty 1

## 2020-12-19 MED ORDER — LIDOCAINE 2% (20 MG/ML) 5 ML SYRINGE
INTRAMUSCULAR | Status: AC
  Filled 2020-12-19: qty 5

## 2020-12-19 MED ORDER — FENTANYL CITRATE (PF) 100 MCG/2ML IJ SOLN: 25.0000 ug | INTRAMUSCULAR | Status: DC | PRN

## 2020-12-19 MED ORDER — PHENYLEPHRINE HCL (PRESSORS) 10 MG/ML IV SOLN
INTRAVENOUS | Status: DC | PRN
  Administered 2020-12-19: 80 ug via INTRAVENOUS
  Administered 2020-12-19: 40 ug via INTRAVENOUS
  Administered 2020-12-19: 120 ug via INTRAVENOUS
  Administered 2020-12-19: 40 ug via INTRAVENOUS
  Administered 2020-12-19: 120 ug via INTRAVENOUS
  Administered 2020-12-19: 80 ug via INTRAVENOUS
  Administered 2020-12-19: 40 ug via INTRAVENOUS

## 2020-12-19 MED ORDER — PROPOFOL 10 MG/ML IV BOLUS
INTRAVENOUS | Status: DC | PRN
  Administered 2020-12-19: 50 mg via INTRAVENOUS
  Administered 2020-12-19: 150 mg via INTRAVENOUS
  Administered 2020-12-19: 50 mg via INTRAVENOUS

## 2020-12-19 MED ORDER — PROPOFOL 10 MG/ML IV BOLUS
INTRAVENOUS | Status: AC
  Filled 2020-12-19: qty 20

## 2020-12-19 MED ORDER — LACTATED RINGERS IV SOLN: INTRAVENOUS | Status: DC

## 2020-12-19 MED ORDER — FENTANYL CITRATE (PF) 100 MCG/2ML IJ SOLN
INTRAMUSCULAR | Status: AC
  Filled 2020-12-19: qty 2

## 2020-12-19 MED ORDER — LIDOCAINE 2% (20 MG/ML) 5 ML SYRINGE
INTRAMUSCULAR | Status: DC | PRN
  Administered 2020-12-19: 60 mg via INTRAVENOUS

## 2020-12-19 MED ORDER — DEXAMETHASONE SODIUM PHOSPHATE 10 MG/ML IJ SOLN
INTRAMUSCULAR | Status: DC | PRN
  Administered 2020-12-19: 4 mg via INTRAVENOUS

## 2020-12-19 MED ORDER — STERILE WATER FOR IRRIGATION IR SOLN
Status: DC | PRN
  Administered 2020-12-19: 3000 mL

## 2020-12-19 MED ORDER — OXYCODONE HCL 5 MG PO TABS
5.0000 mg | ORAL_TABLET | Freq: Once | ORAL | Status: DC | PRN
Start: 2020-12-19 — End: 2020-12-19

## 2020-12-19 MED ORDER — OXYCODONE HCL 5 MG/5ML PO SOLN: 5.0000 mg | Freq: Once | ORAL | Status: DC | PRN

## 2020-12-19 MED ORDER — RIVAROXABAN 20 MG PO TABS: 20.0000 mg | ORAL_TABLET | Freq: Every day | ORAL | 1 refills | Status: DC

## 2020-12-19 MED ORDER — ONDANSETRON HCL 4 MG/2ML IJ SOLN: 4.0000 mg | Freq: Once | INTRAMUSCULAR | Status: DC | PRN

## 2020-12-19 MED ORDER — FENTANYL CITRATE (PF) 100 MCG/2ML IJ SOLN
INTRAMUSCULAR | Status: DC | PRN
  Administered 2020-12-19: 50 ug via INTRAVENOUS

## 2020-12-19 MED ORDER — ACETAMINOPHEN 500 MG PO TABS
ORAL_TABLET | ORAL | Status: AC
  Filled 2020-12-19: qty 2

## 2020-12-19 MED ORDER — EPHEDRINE SULFATE-NACL 50-0.9 MG/10ML-% IV SOSY
PREFILLED_SYRINGE | INTRAVENOUS | Status: DC | PRN
  Administered 2020-12-19 (×4): 10 mg via INTRAVENOUS

## 2020-12-19 MED ORDER — ACETAMINOPHEN 500 MG PO TABS
1000.0000 mg | ORAL_TABLET | Freq: Once | ORAL | Status: AC
  Administered 2020-12-19: 1000 mg via ORAL

## 2020-12-19 MED ORDER — ONDANSETRON HCL 4 MG/2ML IJ SOLN
INTRAMUSCULAR | Status: AC
  Filled 2020-12-19: qty 2

## 2020-12-19 MED ORDER — CEFAZOLIN SODIUM-DEXTROSE 2-4 GM/100ML-% IV SOLN
INTRAVENOUS | Status: AC
  Filled 2020-12-19: qty 100

## 2020-12-19 SURGICAL SUPPLY — 26 items
BAG DRAIN URO-CYSTO SKYTR STRL (DRAIN) ×2 IMPLANT
BAG DRN RND TRDRP ANRFLXCHMBR (UROLOGICAL SUPPLIES)
BAG DRN UROCATH (DRAIN) ×1
BAG URINE DRAIN 2000ML AR STRL (UROLOGICAL SUPPLIES) IMPLANT
BAG URINE LEG 500ML (DRAIN) IMPLANT
CATH COUDE FOLEY 2W 5CC 18FR (CATHETERS) IMPLANT
CATH FOLEY 2WAY SLVR  5CC 16FR (CATHETERS)
CATH FOLEY 2WAY SLVR  5CC 18FR (CATHETERS)
CATH FOLEY 2WAY SLVR 5CC 16FR (CATHETERS) IMPLANT
CATH FOLEY 2WAY SLVR 5CC 18FR (CATHETERS) IMPLANT
CLOTH BEACON ORANGE TIMEOUT ST (SAFETY) ×2 IMPLANT
ELECT REM PT RETURN 9FT ADLT (ELECTROSURGICAL) ×2
ELECTRODE REM PT RTRN 9FT ADLT (ELECTROSURGICAL) ×1 IMPLANT
GLOVE SURG ENC MOIS LTX SZ6 (GLOVE) ×2 IMPLANT
GLOVE SURG ENC MOIS LTX SZ7 (GLOVE) ×2 IMPLANT
GLOVE SURG ENC MOIS LTX SZ7.5 (GLOVE) ×2 IMPLANT
GLOVE SURG ENC MOIS LTX SZ8 (GLOVE) IMPLANT
GOWN STRL REUS W/TWL LRG LVL3 (GOWN DISPOSABLE) ×2 IMPLANT
KIT TURNOVER CYSTO (KITS) ×2 IMPLANT
MANIFOLD NEPTUNE II (INSTRUMENTS) ×2 IMPLANT
NEEDLE HYPO 22GX1.5 SAFETY (NEEDLE) IMPLANT
NS IRRIG 500ML POUR BTL (IV SOLUTION) IMPLANT
PACK CYSTO (CUSTOM PROCEDURE TRAY) ×2 IMPLANT
SYSTEM UROLIFT (Male Continence) ×6 IMPLANT
TUBE CONNECTING 12X1/4 (SUCTIONS) IMPLANT
WATER STERILE IRR 3000ML UROMA (IV SOLUTION) ×2 IMPLANT

## 2020-12-19 NOTE — Anesthesia Postprocedure Evaluation (Signed)
Anesthesia Post Note  Patient: Francis Shaw  Procedure(s) Performed: CYSTOSCOPY WITH INSERTION OF UROLIFT (N/A Renal)     Patient location during evaluation: PACU Anesthesia Type: General Level of consciousness: awake and alert, oriented and patient cooperative Pain management: pain level controlled Vital Signs Assessment: post-procedure vital signs reviewed and stable Respiratory status: spontaneous breathing, nonlabored ventilation and respiratory function stable Cardiovascular status: blood pressure returned to baseline and stable Postop Assessment: no apparent nausea or vomiting Anesthetic complications: no   No complications documented.  Last Vitals:  Vitals:   12/19/20 0818 12/19/20 0830  BP: 123/62 116/70  Pulse: 68 (!) 58  Resp: 13 (!) 27  Temp: 36.7 C   SpO2: 97% 95%    Last Pain:  Vitals:   12/19/20 0845  TempSrc:   PainSc: 0-No pain                 Pervis Hocking

## 2020-12-19 NOTE — Anesthesia Procedure Notes (Signed)
Procedure Name: LMA Insertion Date/Time: 12/19/2020 7:41 AM Performed by: Bonney Aid, CRNA Pre-anesthesia Checklist: Patient identified, Emergency Drugs available, Suction available and Patient being monitored Patient Re-evaluated:Patient Re-evaluated prior to induction Oxygen Delivery Method: Circle system utilized Preoxygenation: Pre-oxygenation with 100% oxygen Induction Type: IV induction Ventilation: Mask ventilation without difficulty LMA: LMA inserted LMA Size: 5.0 Number of attempts: 1 Airway Equipment and Method: Bite block Placement Confirmation: positive ETCO2 Tube secured with: Tape Dental Injury: Teeth and Oropharynx as per pre-operative assessment

## 2020-12-19 NOTE — H&P (Signed)
H&P  Chief Complaint: BPH with weak stream  History of Present Illness: Francis Shaw is a 80 year old male with a history of BPH for several years.  He tried tamsulosin and it was effective but he desired to be off medication.  He underwent resume water vapor therapy December 2020 and initially did very well.  He was able to stop tamsulosin.  He then developed recurrence of his weak stream after stopping the medication and had to restart tamsulosin.  His stream improved and he underwent cystoscopy February 2022 in the office which showed some residual obstruction from the lateral lobe tissue.  His prostate measured 41 g on prior ultrasound.  His PSA was 2.4 29 August 2020.  He presents today for UroLift with desire to get off tamsulosin.  He has been well.  He has had no dysuria or gross hematuria.  He has had some mild cough, congestion and sore throat for the past 3 days.  He reports he typically gets these symptoms seasonally with allergies.  He had a negative Covid.  He has had no fever.  Past Medical History:  Diagnosis Date  . Abnormal EKG    hx of LBBB on ekg  . Allergy   . Arthritis    left hip  . Atrial fibrillation ([place])    a. Eliquis initiated 04/2013. b. on Xarelto 2020  . Atrial flutter ([place])   . Esophageal reflux 04/12/2013  . Hypertension   . Nonischemic cardiomyopathy ([place])    a. presumed to be tachy mediated;  b. 03/2013 Echo: EF 30-35%, diff HK, mild LVH, mildly dil LA.  Marland Kitchen Pericarditis 103 yrs ago age 78  . Plantar wart, right foot 03/26/2017  . Wears glasses    Past Surgical History:  Procedure Laterality Date  . CALCANEAL OSTEOTOMY Right 1993   "stapled it; screwed it" (04/28/2013)  . CALCANEUS HARDWARE REMOVAL Right ~ 1994  . COLONOSCOPY     10-12 years ago; unable to find records  . COLONOSCOPY  2021  . 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 LEFT HEART CATHETERIZATION WITH CORONARY ANGIOGRAM N/A 09/30/2013   Procedure: LEFT HEART CATHETERIZATION WITH  CORONARY ANGIOGRAM;  Surgeon: Burnell Blanks, MD;  Location: [facility] CATH LAB;  Service: Cardiovascular;  Laterality: N/A;  . TONSILLECTOMY  1940's  . 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 b complex vitamins capsule Take 1 capsule by mouth daily.   12/18/2020 at Unknown time  . carvedilol (COREG) 25 MG tablet TAKE 1 TABLET BY MOUTH TWICE A DAY 180 tablet 1 12/19/2020 at 0430  . Cholecalciferol (VITAMIN D3 PO) Take by mouth daily.   12/18/2020 at Unknown time  . losartan (COZAAR) 50 MG tablet Take 1 tablet (50 mg total) by mouth daily. (Patient taking differently: Take 50 mg by mouth.) 90 tablet 3 12/18/2020 at Unknown time  . Multiple Vitamins-Minerals (CENTRUM SILVER PO) Take 1 tablet by mouth daily.   12/18/2020 at Unknown time  . tamsulosin (FLOMAX) 0.4 MG CAPS capsule Take 0.4 mg by mouth as needed.  11 Past Month at Unknown time  . fluticasone (FLONASE) 50 MCG/ACT nasal spray Place 2 sprays into both nostrils daily as needed for allergies or rhinitis.   More than a month at Unknown time  .  loratadine (CLARITIN) 10 MG tablet TAKE 1 TABLET (10 MG TOTAL) BY MOUTH 2 (TWO) TIMES DAILY AS NEEDED FOR ALLERGIES. 180 tablet 1 More than a month at Unknown time  . XARELTO 20 MG TABS tablet TAKE 1 TABLET BY MOUTH DAILY WITH SUPPER. 90 tablet 1 12/15/2020   Allergies: No Known Allergies  Family History  Problem Relation Age of Onset  . Stroke Father   . Asthma Mother        low back pain  . Colon cancer Neg Hx   . Esophageal cancer Neg Hx   . Rectal cancer Neg Hx   . Stomach cancer Neg Hx    Social History:  reports that he quit smoking about 41 years ago. His smoking use included cigarettes. He has a 18.00 pack-year smoking history. He has never used  smokeless tobacco. He reports current alcohol use. He reports that he does not use drugs.  ROS: A complete review of systems was performed.  All systems are negative except for pertinent findings as noted. Review of Systems  All other systems reviewed and are negative.    Physical Exam:  Vital signs in last 24 hours: Temp:  [98.2 F (36.8 C)] 98.2 F (36.8 C) (03/29 0557) Pulse Rate:  [60] 60 (03/29 0557) Resp:  [15] 15 (03/29 0557) BP: (124)/(68) 124/68 (03/29 0557) SpO2:  [97 %] 97 % (03/29 0557) Weight:  [85.5 kg] 85.5 kg (03/29 0557) General:  Alert and oriented, No acute distress HEENT: Normocephalic, atraumatic Cardiovascular: Regular rate and rhythm Lungs: Regular rate and effort Abdomen: Soft, nontender, nondistended, no abdominal masses Back: No CVA tenderness Extremities: No edema Neurologic: Grossly intact  Laboratory Data:  Results for orders placed or performed during the hospital encounter of 12/19/20 (from the past 24 hour(s))  I-STAT, chem 8     Status: Abnormal   Collection Time: 12/19/20  6:53 AM  Result Value Ref Range   Sodium 142 135 - 145 mmol/L   Potassium 4.4 3.5 - 5.1 mmol/L   Chloride 105 98 - 111 mmol/L   BUN 11 8 - 23 mg/dL   Creatinine, Ser 0.80 0.61 - 1.24 mg/dL   Glucose, Bld 122 (H) 70 - 99 mg/dL   Calcium, Ion 1.31 1.15 - 1.40 mmol/L   TCO2 25 22 - 32 mmol/L   Hemoglobin 14.6 13.0 - 17.0 g/dL   HCT 43.0 39.0 - 52.0 %   Recent Results (from the past 240 hour(s))  SARS CORONAVIRUS 2 (TAT 6-24 HRS) Nasopharyngeal Nasopharyngeal Swab     Status: None   Collection Time: 12/15/20 10:15 AM   Specimen: Nasopharyngeal Swab  Result Value Ref Range Status   SARS Coronavirus 2 NEGATIVE NEGATIVE Final    Comment: (NOTE) SARS-CoV-2 target nucleic acids are NOT DETECTED.  The SARS-CoV-2 RNA is generally detectable in upper and lower respiratory specimens during the acute phase of infection. Negative results do not preclude SARS-CoV-2 infection,  do not rule out co-infections with other pathogens, and should not be used as the sole basis for treatment or other patient management decisions. Negative results must be combined with clinical observations, patient history, and epidemiological information. The expected result is Negative.  Fact Sheet for Patients: SugarRoll.be  Fact Sheet for Healthcare Providers: https://www.woods-mathews.com/  This test is not yet approved or cleared by the [place] FDA and  has been authorized for detection and/or diagnosis of SARS-CoV-2 by FDA under an Emergency Use Authorization (EUA). This EUA will remain  in effect (meaning this  test can be used) for the duration of the COVID-19 declaration under Se ction 564(b)(1) of the Act, 21 U.S.C. section 360bbb-3(b)(1), unless the authorization is terminated or revoked sooner.  Performed at [facility], [place] [address], [place], [place] [address]    Creatinine: Recent Labs    12/19/20 4008  CREATININE 0.80    Impression/Assessment:  BPH with weak stream -   Plan:  I discussed with the patient the nature, potential benefits, risks and alternatives to prostatic urethral lift, including side effects of the proposed treatment, the likelihood of the patient achieving the goals of the procedure, and any potential problems that might occur during the procedure or recuperation. All questions answered. Patient elects to proceed.    Francis Shaw 12/19/2020, 7:30 AM

## 2020-12-19 NOTE — Progress Notes (Signed)
LE:  Pt states he has developed symptoms over the weekend, congestion/runny nose, sore throat and cough. States similar to his seasonal allergy symptoms. Is not currently taking his allergy medications, holding all due to surgery.  Covid test 3/25- negative, has quarantined at home since swab. No known exposure to anyone sick or covid positive that he is aware of.  Reported to anesthesiologist, Ray Church MD.

## 2020-12-19 NOTE — Anesthesia Preprocedure Evaluation (Signed)
Anesthesia Evaluation  Patient identified by MRN, date of birth, ID band Patient awake    Reviewed: Allergy & Precautions, NPO status , Patient's Chart, lab work & pertinent test results, reviewed documented beta blocker date and time   Airway Mallampati: II  TM Distance: >3 FB Neck ROM: Full    Dental no notable dental hx. (+) Teeth Intact, Dental Advisory Given   Pulmonary former smoker,  Currently has allergy symptoms c/w what he usually experiences this time of the year. Neg covid test, has quarantined for the last 4 days   Quit smoking 1981, 18 pack year history    Pulmonary exam normal breath sounds clear to auscultation       Cardiovascular hypertension, Pt. on medications and Pt. on home beta blockers +CHF (grade 2 diastolic dysfunction, LVEF 45% (up from 35% in 2014))  Normal cardiovascular exam+ dysrhythmias (xarelto last dose 3/25) Atrial Fibrillation  Rhythm:Regular Rate:Normal  Last echo 2018: - Left ventricle: The cavity size was normal. Systolic function was  mildly reduced. The estimated ejection fraction was in the range  of 45% to 50%. Wall motion was normal; there were no regional  wall motion abnormalities. Features are consistent with a  pseudonormal left ventricular filling pattern, with concomitant  abnormal relaxation and increased filling pressure (grade 2  diastolic dysfunction).  - Ventricular septum: Septal motion showed abnormal function,  dyssynergy, and paradox. These changes are consistent with  intraventricular conduction delay.  - Pulmonary arteries: PA peak pressure: 31 mm Hg (S).    Neuro/Psych negative neurological ROS  negative psych ROS   GI/Hepatic Neg liver ROS, GERD  ,  Endo/Other  negative endocrine ROS  Renal/GU negative Renal ROS   BPH    Musculoskeletal  (+) Arthritis , Osteoarthritis,    Abdominal   Peds  Hematology negative hematology ROS (+)    Anesthesia Other Findings   Reproductive/Obstetrics negative OB ROS                             Anesthesia Physical Anesthesia Plan  ASA: II  Anesthesia Plan: General   Post-op Pain Management:    Induction: Intravenous  PONV Risk Score and Plan: 3 and Ondansetron, Dexamethasone and Treatment may vary due to age or medical condition  Airway Management Planned: LMA  Additional Equipment: None  Intra-op Plan:   Post-operative Plan: Extubation in OR  Informed Consent: I have reviewed the patients History and Physical, chart, labs and discussed the procedure including the risks, benefits and alternatives for the proposed anesthesia with the patient or authorized representative who has indicated his/her understanding and acceptance.     Dental advisory given  Plan Discussed with: CRNA  Anesthesia Plan Comments:         Anesthesia Quick Evaluation

## 2020-12-19 NOTE — Transfer of Care (Signed)
Immediate Anesthesia Transfer of Care Note  Patient: Francis Shaw  Procedure(s) Performed: CYSTOSCOPY WITH INSERTION OF UROLIFT (N/A Renal)  Patient Location: PACU  Anesthesia Type:General  Level of Consciousness: awake, alert  and oriented  Airway & Oxygen Therapy: Patient Spontanous Breathing and Patient connected to nasal cannula oxygen  Post-op Assessment: Report given to RN  Post vital signs: Reviewed and stable  Last Vitals:  Vitals Value Taken Time  BP 123/62 12/19/20 0818  Temp    Pulse 66 12/19/20 0819  Resp 22 12/19/20 0819  SpO2 97 % 12/19/20 0819  Vitals shown include unvalidated device data.  Last Pain:  Vitals:   12/19/20 0557  TempSrc: Oral  PainSc: 0-No pain      Patients Stated Pain Goal: 5 (79/15/04 1364)  Complications: No complications documented.

## 2020-12-19 NOTE — Op Note (Signed)
Preoperative diagnosis: BPH with weak stream  Postoperative diagnosis: Same  Procedure: Cystoscopy with prostatic urethral lift x3  Surgeon: Junious Silk  Assistant: Sheppard Coil  Anesthesia: General  Indication for procedure: [name] is a 80 year old male with BPH and weak stream.  He does well with tamsulosin but wanted to get off medication.  He initially did well after resume water vapor therapy but had recurrence of his symptoms off alpha blockers.  Office cystoscopy revealed obstructing lateral lobe tissue.  Findings: On exam under anesthesia the penis was circumcised and without mass or lesion.  The glans and meatus appeared normal.  The scrotum was normal and the testicles palpably normal.  On DRE the prostate was about 50 g and smooth without hard area or nodule.  On cystoscopy the urethra was unremarkable, the prostatic urethra was obstructed by left lateral lobe hypertrophy.  The right lateral lobe had been completely treated and vaporized from the prior water vapor therapy and the right side did not need to be treated.  All 3 implants were placed on the left.  There was a high bladder neck and he could have an a component of bladder neck obstruction.  The ureteral orifice ease appeared normal and there was moderate trabeculation and cellules.  No mucosal lesions noted.  No stone or foreign body.  Description of procedure: After consent was obtained patient brought to the operating room.  After adequate anesthesia was placed in lithotomy position and prepped and draped in the usual sterile fashion.  13 French cystoscope was passed per urethra and the urethra and bladder carefully inspected.  The 71F cystoscope was inserted into the bladder. The cystoscopy bridge was replaced with a UroLift delivery device.The first treatment site (#1) was the patient's left side approximately 1.5cm distal to the bladder neck. The distal tip of the delivery device was then angled laterally approximately 20  degrees at this position to compress the lateral lobe. The trigger was pulled, thereby deploying a needle containing the implant through the prostate. The needle was then retracted, allowing one end of the implant to be delivered to the capsular surface of the prostate. The implant was then tensioned to assure capsular seating and removal of slack monofilament. The device was then angled back toward midline and slowly advanced proximally until cystoscopic verification of the monofilament being centered in the delivery bay. The urethral end piece was then affixed to the monofilament thereby tailoring the size of the implant. Excess filament was then severed. The delivery device was then re-advanced into the bladder. The delivery device was then replaced with cystoscope and bridge and the implant location and opening effect was confirmed cystoscopically. The same procedure was then repeated on the left side (#2) delivered just proximal to the verumontanum, but still there was some left apical lateral lobe obstruction. A final implant (#3) was delivered again on left left just proximal to the verumontanum and slightly more distal and inferior to #2.  A final cystoscopy was conducted first to inspect the location and state of each implant and second, to confirm the presence of a continuous anterior channel was present through the prostatic urethra with irrigation flow turned off.  3 Implants were delivered in total. Following this, the bladder was drained and the scope was removed. No catheter was placed.  I did an exam under anesthesia.  He was then awakened and taken to the recovery area in stable condition. He tolerated the procedure well.  Complications: None  Blood loss: Minimal  Specimens: None  Drains: None  Disposition: Patient stable to PACU

## 2020-12-19 NOTE — Discharge Instructions (Signed)
Prostatic Urethral Lift, Care After This sheet gives you information about how to care for yourself after your procedure. Your health care provider may also give you more specific instructions. If you have problems or questions, contact your health care provider. What can I expect after the procedure? After the procedure, it is common to have:  Discomfort or burning when urinating.  An increased urge to urinate.  More frequent urination.  Urine that is slightly blood-tinged. These symptoms should go away after a few days. Follow these instructions at home:  Take over-the-counter and prescription medicines only as told by your health care provider.  Do not drive for 24 hours if you were given a medicine to help you relax (sedative).  Do not drive or use heavy machinery while taking prescription pain medicine.  Do not lift anything that is heavier than 10 lb (4.5 kg) until your health care provider says that this is safe.  Return to your normal activities as told by your health care provider. Ask your health care provider what activities are safe for you. Ask when you can return to sexual activity.  Drink enough fluid to keep your urine clear or pale yellow.  Keep all follow-up visits as told by your health care provider. This is important.   Contact a health care provider if:  You have chills or a fever.  You have pain when passing urine.  You have bright red blood or blood clots in your urine.  You have difficulty passing urine.  You have leaking of urine (incontinence). Get help right away if:  You have chest pain or shortness of breath.  You have leg pain or swelling.  You cannot pass urine. Summary  After the procedure, it is common to have discomfort or burning when urinating, an increased urge to urinate, more frequent urination, and urine that is slightly blood-tinged.  Do not drive for 24 hours if you were given a medicine to help you relax (sedative). Do not  drive or use heavy machinery while taking prescription pain medicine.  Do not lift anything that is heavier than 10 lb (4.5 kg) until your health care provider says that this is safe.  Return to your normal activities as told by your health care provider. This information is not intended to replace advice given to you by your health care provider. Make sure you discuss any questions you have with your health care provider. Document Revised: 05/18/2020 Document Reviewed: 05/18/2020 Elsevier Patient Education  [place] Instructions  Activity: Get plenty of rest for the remainder of the day. A responsible individual must stay with you for 24 hours following the procedure.  For the next 24 hours, DO NOT: -Drive a car -Paediatric nurse -Drink alcoholic beverages -Take any medication unless instructed by your physician -Make any legal decisions or sign important papers.  Meals: Start with liquid foods such as gelatin or soup. Progress to regular foods as tolerated. Avoid greasy, spicy, heavy foods. If nausea and/or vomiting occur, drink only clear liquids until the nausea and/or vomiting subsides. Call your physician if vomiting continues.  Special Instructions/Symptoms: Your throat may feel dry or sore from the anesthesia or the breathing tube placed in your throat during surgery. If this causes discomfort, gargle with warm salt water. The discomfort should disappear within 24 hours.    No additional Tylenol/acetaminophen until after 1:00 pm today if needed.

## 2020-12-20 ENCOUNTER — Encounter (HOSPITAL_BASED_OUTPATIENT_CLINIC_OR_DEPARTMENT_OTHER): Payer: Self-pay | Admitting: Urology

## 2020-12-21 ENCOUNTER — Other Ambulatory Visit: Payer: Self-pay | Admitting: Cardiology

## 2020-12-22 DIAGNOSIS — J3089 Other allergic rhinitis: Secondary | ICD-10-CM | POA: Diagnosis not present

## 2020-12-22 DIAGNOSIS — J019 Acute sinusitis, unspecified: Secondary | ICD-10-CM | POA: Diagnosis not present

## 2021-01-03 DIAGNOSIS — R3912 Poor urinary stream: Secondary | ICD-10-CM | POA: Diagnosis not present

## 2021-01-03 DIAGNOSIS — N401 Enlarged prostate with lower urinary tract symptoms: Secondary | ICD-10-CM | POA: Diagnosis not present

## 2021-03-01 ENCOUNTER — Other Ambulatory Visit: Payer: Self-pay

## 2021-03-01 ENCOUNTER — Ambulatory Visit (INDEPENDENT_AMBULATORY_CARE_PROVIDER_SITE_OTHER): Payer: Medicare HMO | Admitting: Family Medicine

## 2021-03-01 VITALS — BP 110/62 | HR 85 | Temp 98.5°F | Resp 16 | Ht 71.0 in | Wt 186.0 lb

## 2021-03-01 DIAGNOSIS — M19042 Primary osteoarthritis, left hand: Secondary | ICD-10-CM

## 2021-03-01 DIAGNOSIS — I10 Essential (primary) hypertension: Secondary | ICD-10-CM | POA: Diagnosis not present

## 2021-03-01 DIAGNOSIS — E785 Hyperlipidemia, unspecified: Secondary | ICD-10-CM

## 2021-03-01 DIAGNOSIS — I4891 Unspecified atrial fibrillation: Secondary | ICD-10-CM

## 2021-03-01 DIAGNOSIS — R972 Elevated prostate specific antigen [PSA]: Secondary | ICD-10-CM | POA: Diagnosis not present

## 2021-03-01 DIAGNOSIS — M19019 Primary osteoarthritis, unspecified shoulder: Secondary | ICD-10-CM

## 2021-03-01 DIAGNOSIS — Z0000 Encounter for general adult medical examination without abnormal findings: Secondary | ICD-10-CM

## 2021-03-01 DIAGNOSIS — R739 Hyperglycemia, unspecified: Secondary | ICD-10-CM | POA: Diagnosis not present

## 2021-03-01 DIAGNOSIS — L578 Other skin changes due to chronic exposure to nonionizing radiation: Secondary | ICD-10-CM | POA: Diagnosis not present

## 2021-03-01 NOTE — Assessment & Plan Note (Signed)
Well controlled, no changes to meds. Encouraged heart healthy diet such as the DASH diet and exercise as tolerated.  °

## 2021-03-01 NOTE — Assessment & Plan Note (Signed)
Patient encouraged to maintain heart healthy diet, regular exercise, adequate sleep. Consider daily probiotics. Take medications as prescribed. Labs ordered and reviewed 

## 2021-03-01 NOTE — Assessment & Plan Note (Signed)
Thumb is stiff and uncomfortable at times but functional try topical rubs. Report if worsens.

## 2021-03-01 NOTE — Assessment & Plan Note (Signed)
Follows with urology and had a Urolift doing well.

## 2021-03-01 NOTE — Patient Instructions (Signed)
Shingrix is the new shingles shot, 2 shots over 2-6 months, you just need the second one.confirm coverage with insurance and document, then can return here for shots with nurse appt or at [facility] 65 Years and Older, Male Preventive care refers to lifestyle choices and visits with your health care provider that can promote health and wellness. This includes: A yearly physical exam. This is also called an annual wellness visit. Regular dental and eye exams. Immunizations. Screening for certain conditions. Healthy lifestyle choices, such as: Eating a healthy diet. Getting regular exercise. Not using drugs or products that contain nicotine and tobacco. Limiting alcohol use. What can I expect for my preventive care visit? Physical exam Your health care provider will check your: Height and weight. These may be used to calculate your BMI (body mass index). BMI is a measurement that tells if you are at a healthy weight. Heart rate and blood pressure. Body temperature. Skin for abnormal spots. Counseling Your health care provider may ask you questions about your: Past medical problems. Family's medical history. Alcohol, tobacco, and drug use. Emotional well-being. Home life and relationship well-being. Sexual activity. Diet, exercise, and sleep habits. History of falls. Memory and ability to understand (cognition). Work and work Statistician. Access to firearms. What immunizations do I need? Vaccines are usually given at various ages, according to a schedule. Your health care provider will recommend vaccines for you based on your 80, medical history, and lifestyle or other factors, such as travel or where you work.   What tests do I need? Blood tests Lipid and cholesterol levels. These may be checked every 5 years, or more often depending on your overall health. Hepatitis C test. Hepatitis B test. Screening Lung cancer screening. You may have this screening every year  starting at age 79 if you have a 30-pack-year history of smoking and currently smoke or have quit within the past 15 years. Colorectal cancer screening. All adults should have this screening starting at age 79 and continuing until age 79. Your health care provider may recommend screening at age 79 if you are at increased risk. You will have tests every 1-10 years, depending on your results and the type of screening test. Prostate cancer screening. Recommendations will vary depending on your family history and other risks. Genital exam to check for testicular cancer or hernias. Diabetes screening. This is done by checking your blood sugar (glucose) after you have not eaten for a while (fasting). You may have this done every 1-3 years. Abdominal aortic aneurysm (AAA) screening. You may need this if you are a current or former smoker. STD (sexually transmitted disease) testing, if you are at risk. Follow these instructions at home: Eating and drinking Eat a diet that includes fresh fruits and vegetables, whole grains, lean protein, and low-fat dairy products. Limit your intake of foods with high amounts of sugar, saturated fats, and salt. Take vitamin and mineral supplements as recommended by your health care provider. Do not drink alcohol if your health care provider tells you not to drink. If you drink alcohol: Limit how much you have to 0-2 drinks a day. Be aware of how much alcohol is in your drink. In the [place] one drink equals one 12 oz bottle of beer (355 mL), one 5 oz glass of wine (148 mL), or one 1 oz glass of hard liquor (44 mL).   Lifestyle Take daily care of your teeth and gums. Brush your teeth every morning and night with fluoride  toothpaste. Floss one time each day. Stay active. Exercise for at least 30 minutes 5 or more days each week. Do not use any products that contain nicotine or tobacco, such as cigarettes, e-cigarettes, and chewing tobacco. If you need help quitting, ask  your health care provider. Do not use drugs. If you are sexually active, practice safe sex. Use a condom or other form of protection to prevent STIs (sexually transmitted infections). Talk with your health care provider about taking a low-dose aspirin or statin. Find healthy ways to cope with stress, such as: Meditation, yoga, or listening to music. Journaling. Talking to a trusted person. Spending time with friends and family. Safety Always wear your seat belt while driving or riding in a vehicle. Do not drive: If you have been drinking alcohol. Do not ride with someone who has been drinking. When you are tired or distracted. While texting. Wear a helmet and other protective equipment during sports activities. If you have firearms in your house, make sure you follow all gun safety procedures. What's next? Visit your health care provider once a year for an annual wellness visit. Ask your health care provider how often you should have your eyes and teeth checked. Stay up to date on all vaccines. This information is not intended to replace advice given to you by your health care provider. Make sure you discuss any questions you have with your health care provider. Document Revised: 06/08/2019 Document Reviewed: 09/03/2018 Elsevier Patient Education  2021 [employer].

## 2021-03-01 NOTE — Assessment & Plan Note (Signed)
encouraged heart healthy diet, avoid trans fats, minimize simple carbs and saturated fats. Increase exercise as tolerated 

## 2021-03-01 NOTE — Assessment & Plan Note (Signed)
Notes stiffness in shoulders but is manageable and he is not interested in further work up

## 2021-03-01 NOTE — Progress Notes (Signed)
Patient ID: Francis Shaw, male    DOB: 06-11-1941  Age: 80 y.o. MRN: [id number]    Subjective:  Subjective  [name] presents for office visit today for follow up on enlarged prostate and HTN. He states that he has no recent hospitalizations or recent ED visits to report. 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 He reports that it was never painful, but protrudes all the time. He denies experiencing any redness, appetite changes, or hot flashes in that area. He denies any chest pain, SOB, fever, abdominal pain, muscle cramps, cough, chills, sore throat, dysuria, urinary incontinence, back pain, HA, or N/VD. He states that his shoulders and lower back feel stiff and sore. He states that the soreness of the lower back is due to his shingles infection he had. He states that his left thumb is sore due to arthritis, but denies feeling any redness or swelling. He reports that the second treatment of Urolift by Dr. Junious Silk has helped a lot with his prostate symptoms.   Review of Systems  Constitutional:  Negative for appetite change, chills, fatigue and fever.  HENT:  Negative for congestion, rhinorrhea, sinus pressure, sinus pain and sore throat.   Eyes:  Negative for pain.  Respiratory:  Negative for cough and shortness of breath.   Cardiovascular:  Negative for chest pain, palpitations and leg swelling.  Gastrointestinal:  Negative for abdominal pain, blood in stool, diarrhea, nausea and vomiting.  Genitourinary:  Negative for flank pain, frequency and penile pain.  Musculoskeletal:  Positive for arthralgias (local to left thumb). Negative for back pain.       (+) soreness local to left thumb (+) soreness local to lower back secondary to recent shingles infection  Neurological:  Negative for headaches.   History Past Medical History:  Diagnosis Date   Abnormal EKG    hx of LBBB on ekg   Allergy    Arthritis    left hip    Atrial fibrillation (HCC)    a. Eliquis initiated 04/2013. b. on Xarelto 2020   Atrial flutter ([place])    Esophageal reflux 04/12/2013   Hypertension    Nonischemic cardiomyopathy ([place])    a. presumed to be tachy mediated;  b. 03/2013 Echo: EF 30-35%, diff HK, mild LVH, mildly dil LA.   Pericarditis 60 yrs ago age 78   Plantar wart, right foot 03/26/2017   Wears glasses     He has a past surgical history that includes Calcaneal osteotomy (Right, 1993); Finger amputation (Right, 1996); Calcaneus hardware removal (Right, ~ 1994); left heart catheterization with coronary angiogram (N/A, 09/30/2013); Colonoscopy; Colonoscopy (2021); Total hip arthroplasty (Left, 07/28/2017); Tonsillectomy (1940's); and Cystoscopy with insertion of urolift (N/A, 12/19/2020).   His family history includes Asthma in his mother; Stroke in his father.He reports that he quit smoking about 41 years ago. His smoking use included cigarettes. He has a 18.00 pack-year smoking history. He has never used smokeless tobacco. He reports current alcohol use. He reports that he does not use drugs.  Current Outpatient Medications on File Prior to Visit  Medication Sig Dispense Refill   b complex vitamins capsule Take 1 capsule by mouth daily.     carvedilol (COREG) 25 MG tablet TAKE 1 TABLET BY MOUTH TWICE A DAY 180 tablet 1   Cholecalciferol (VITAMIN D3 PO) Take by mouth daily.     fluticasone (FLONASE) 50 MCG/ACT nasal spray Place 2 sprays into both  nostrils daily as needed for allergies or rhinitis.     loratadine (CLARITIN) 10 MG tablet TAKE 1 TABLET (10 MG TOTAL) BY MOUTH 2 (TWO) TIMES DAILY AS NEEDED FOR ALLERGIES. 180 tablet 1   losartan (COZAAR) 25 MG tablet TAKE 1 TABLET BY MOUTH EVERY DAY 90 tablet 3   losartan (COZAAR) 50 MG tablet Take 1 tablet (50 mg total) by mouth daily. (Patient taking differently: Take 50 mg by mouth.) 90 tablet 3   Multiple Vitamins-Minerals (CENTRUM SILVER PO) Take 1 tablet by mouth daily.     rivaroxaban  (XARELTO) 20 MG TABS tablet Take 1 tablet (20 mg total) by mouth daily with supper. 90 tablet 1   tamsulosin (FLOMAX) 0.4 MG CAPS capsule Take 0.4 mg by mouth as needed.  11   No current facility-administered medications on file prior to visit.     Objective:  Objective  Physical Exam Constitutional:      General: He is not in acute distress.    Appearance: Normal appearance. He is not ill-appearing or toxic-appearing.  HENT:     Head: Normocephalic and atraumatic.     Right Ear: Tympanic membrane, ear canal and external ear normal.     Left Ear: Tympanic membrane, ear canal and external ear normal.     Nose: No congestion or rhinorrhea.  Eyes:     Extraocular Movements: Extraocular movements intact.     Pupils: Pupils are equal, round, and reactive to light.  Cardiovascular:     Rate and Rhythm: Normal rate and regular rhythm.     Pulses: Normal pulses.     Heart sounds: Normal heart sounds. No murmur heard. Pulmonary:     Effort: Pulmonary effort is normal. No respiratory distress.     Breath sounds: Normal breath sounds. No wheezing, rhonchi or rales.  Abdominal:     General: Bowel sounds are normal.     Palpations: Abdomen is soft. There is no mass.     Tenderness: no abdominal tenderness There is no guarding.     Hernia: No hernia is present.  Musculoskeletal:        General: Normal range of motion.     Cervical back: Normal range of motion and neck supple.  Skin:    General: Skin is warm and dry.  Neurological:     Mental Status: He is alert and oriented to person, place, and time.  Psychiatric:        Behavior: Behavior normal.   BP 110/62   Pulse 85   Temp 98.5 F (36.9 C)   Resp 16   Ht 5\' 11"  (1.803 m)   Wt 186 lb (84.4 kg)   SpO2 98%   BMI 25.94 kg/m  Wt Readings from Last 3 Encounters:  03/01/21 186 lb (84.4 kg)  12/19/20 188 lb 8 oz (85.5 kg)  10/18/20 188 lb 0.6 oz (85.3 kg)     Lab Results  Component Value Date   WBC 7.3 10/25/2020   HGB  14.6 12/19/2020   HCT 43.0 12/19/2020   PLT 173 10/25/2020   GLUCOSE 122 (H) 12/19/2020   CHOL 172 07/23/2018   TRIG 199.0 (H) 07/23/2018   HDL 41.00 07/23/2018   LDLCALC 91 07/23/2018   ALT 11 07/23/2018   AST 15 07/23/2018   NA 142 12/19/2020   K 4.4 12/19/2020   CL 105 12/19/2020   CREATININE 0.80 12/19/2020   BUN 11 12/19/2020   CO2 27 10/25/2020   TSH 1.83 07/23/2018  PSA 4.20 (H) 01/20/2018   INR 1.08 07/28/2017   HGBA1C 5.3 01/20/2018    No results found.   Assessment & Plan:  Plan    No orders of the defined types were placed in this encounter.   Problem List Items Addressed This Visit     Arthritis of shoulder    Notes stiffness in shoulders but is manageable and he is not interested in further work up       Preventative health care    Patient encouraged to maintain heart healthy diet, regular exercise, adequate sleep. Consider daily probiotics. Take medications as prescribed. Labs ordered and reviewed       Hyperlipidemia     encouraged heart healthy diet, avoid trans fats, minimize simple carbs and saturated fats. Increase exercise as tolerated       Relevant Orders   Lipid panel   Atrial fibrillation (HCC) - Primary   Elevated PSA    Follows with urology and had a Urolift doing well.       HTN (hypertension)    Well controlled, no changes to meds. Encouraged heart healthy diet such as the DASH diet and exercise as tolerated.        Relevant Orders   CBC   Comprehensive metabolic panel   TSH   Hyperglycemia   Relevant Orders   Hemoglobin A1c   Osteoarthritis of finger of left hand    Thumb is stiff and uncomfortable at times but functional try topical rubs. Report if worsens.       Other Visit Diagnoses     Sun-damaged skin       Relevant Orders   Ambulatory referral to Dermatology       Follow-up: Return in about 1 year (around 03/01/2022) for annual exam.   I,Hermes Hanna,acting as a scribe for Penni Homans, MD.,have  documented all relevant documentation on the behalf of Penni Homans, MD,as directed by  Penni Homans, MD while in the presence of Penni Homans, MD.  I, Mosie Lukes, MD personally performed the services described in this documentation. All medical record entries made by the scribe were at my direction and in my presence. I have reviewed the chart and agree that the record reflects my personal performance and is accurate and complete

## 2021-03-02 LAB — LIPID PANEL
Cholesterol: 150 mg/dL (ref 0–200)
HDL: 37.3 mg/dL — ABNORMAL LOW (ref >39.00)
LDL Cholesterol: 78 mg/dL (ref 0–99)
NonHDL: 112.94
Total CHOL/HDL Ratio: 4
Triglycerides: 174 mg/dL — ABNORMAL HIGH (ref 0.0–149.0)
VLDL: 34.8 mg/dL (ref 0.0–40.0)

## 2021-03-02 LAB — COMPREHENSIVE METABOLIC PANEL
ALT: 13 U/L (ref 0–53)
AST: 14 U/L (ref 0–37)
Albumin: 4.1 g/dL (ref 3.5–5.2)
Alkaline Phosphatase: 85 U/L (ref 39–117)
BUN: 16 mg/dL (ref 6–23)
CO2: 29 mEq/L (ref 19–32)
Calcium: 9.3 mg/dL (ref 8.4–10.5)
Chloride: 105 mEq/L (ref 96–112)
Creatinine, Ser: 0.93 mg/dL (ref 0.40–1.50)
GFR: 78.62 mL/min (ref >60.00)
Glucose, Bld: 98 mg/dL (ref 70–99)
Potassium: 4.3 mEq/L (ref 3.5–5.1)
Sodium: 142 mEq/L (ref 135–145)
Total Bilirubin: 1 mg/dL (ref 0.2–1.2)
Total Protein: 6.5 g/dL (ref 6.0–8.3)

## 2021-03-02 LAB — CBC
HCT: 43.1 % (ref 39.0–52.0)
Hemoglobin: 15 g/dL (ref 13.0–17.0)
MCHC: 34.9 g/dL (ref 30.0–36.0)
MCV: 89.8 fl (ref 78.0–100.0)
Platelets: 174 10*3/uL (ref 150.0–400.0)
RBC: 4.8 Mil/uL (ref 4.22–5.81)
RDW: 13.1 % (ref 11.5–15.5)
WBC: 7.5 10*3/uL (ref 4.0–10.5)

## 2021-03-02 LAB — TSH: TSH: 1.5 u[IU]/mL (ref 0.35–4.50)

## 2021-03-02 LAB — HEMOGLOBIN A1C: Hgb A1c MFr Bld: 5.4 % (ref 4.6–6.5)

## 2021-04-23 DIAGNOSIS — L821 Other seborrheic keratosis: Secondary | ICD-10-CM | POA: Diagnosis not present

## 2021-04-23 DIAGNOSIS — L570 Actinic keratosis: Secondary | ICD-10-CM | POA: Diagnosis not present

## 2021-04-23 DIAGNOSIS — L814 Other melanin hyperpigmentation: Secondary | ICD-10-CM | POA: Diagnosis not present

## 2021-04-23 DIAGNOSIS — L820 Inflamed seborrheic keratosis: Secondary | ICD-10-CM | POA: Diagnosis not present

## 2021-04-29 ENCOUNTER — Other Ambulatory Visit: Payer: Self-pay | Admitting: Cardiology

## 2021-04-29 DIAGNOSIS — I428 Other cardiomyopathies: Secondary | ICD-10-CM

## 2021-05-10 ENCOUNTER — Other Ambulatory Visit: Payer: Self-pay | Admitting: Cardiology

## 2021-05-10 NOTE — Telephone Encounter (Signed)
Prescription refill request for Xarelto received.  Indication:afib Last office visit:crenshaw 10/18/20 Weight:84.4kg Age:55mScr:0.93 03/01/21 CrCl:78.1

## 2021-05-12 ENCOUNTER — Emergency Department (HOSPITAL_BASED_OUTPATIENT_CLINIC_OR_DEPARTMENT_OTHER): Payer: Medicare HMO

## 2021-05-12 ENCOUNTER — Encounter (HOSPITAL_BASED_OUTPATIENT_CLINIC_OR_DEPARTMENT_OTHER): Payer: Self-pay | Admitting: Emergency Medicine

## 2021-05-12 ENCOUNTER — Other Ambulatory Visit: Payer: Self-pay

## 2021-05-12 ENCOUNTER — Emergency Department (HOSPITAL_BASED_OUTPATIENT_CLINIC_OR_DEPARTMENT_OTHER)
Admission: EM | Admit: 2021-05-12 | Discharge: 2021-05-12 | Disposition: A | Discharge status: Home / Self Care | Payer: Medicare HMO | Attending: Emergency Medicine | Admitting: Emergency Medicine

## 2021-05-12 DIAGNOSIS — Z96642 Presence of left artificial hip joint: Secondary | ICD-10-CM | POA: Diagnosis not present

## 2021-05-12 DIAGNOSIS — R519 Headache, unspecified: Secondary | ICD-10-CM | POA: Insufficient documentation

## 2021-05-12 DIAGNOSIS — Z79899 Other long term (current) drug therapy: Secondary | ICD-10-CM | POA: Diagnosis not present

## 2021-05-12 DIAGNOSIS — G319 Degenerative disease of nervous system, unspecified: Secondary | ICD-10-CM | POA: Diagnosis not present

## 2021-05-12 DIAGNOSIS — R109 Unspecified abdominal pain: Secondary | ICD-10-CM | POA: Insufficient documentation

## 2021-05-12 DIAGNOSIS — Z87891 Personal history of nicotine dependence: Secondary | ICD-10-CM | POA: Insufficient documentation

## 2021-05-12 DIAGNOSIS — Y9289 Other specified places as the place of occurrence of the external cause: Secondary | ICD-10-CM | POA: Diagnosis not present

## 2021-05-12 DIAGNOSIS — S0990XA Unspecified injury of head, initial encounter: Secondary | ICD-10-CM | POA: Diagnosis not present

## 2021-05-12 DIAGNOSIS — Z7901 Long term (current) use of anticoagulants: Secondary | ICD-10-CM | POA: Diagnosis not present

## 2021-05-12 DIAGNOSIS — W11XXXA Fall on and from ladder, initial encounter: Secondary | ICD-10-CM | POA: Diagnosis not present

## 2021-05-12 DIAGNOSIS — I4891 Unspecified atrial fibrillation: Secondary | ICD-10-CM | POA: Insufficient documentation

## 2021-05-12 DIAGNOSIS — I5022 Chronic systolic (congestive) heart failure: Secondary | ICD-10-CM | POA: Diagnosis not present

## 2021-05-12 DIAGNOSIS — K5730 Diverticulosis of large intestine without perforation or abscess without bleeding: Secondary | ICD-10-CM | POA: Diagnosis not present

## 2021-05-12 DIAGNOSIS — N200 Calculus of kidney: Secondary | ICD-10-CM | POA: Diagnosis not present

## 2021-05-12 DIAGNOSIS — I110 Hypertensive heart disease with heart failure: Secondary | ICD-10-CM | POA: Insufficient documentation

## 2021-05-12 DIAGNOSIS — S300XXA Contusion of lower back and pelvis, initial encounter: Secondary | ICD-10-CM | POA: Insufficient documentation

## 2021-05-12 DIAGNOSIS — S79911A Unspecified injury of right hip, initial encounter: Secondary | ICD-10-CM | POA: Diagnosis present

## 2021-05-12 LAB — CBC WITH DIFFERENTIAL/PLATELET
Abs Immature Granulocytes: 0.04 10*3/uL (ref 0.00–0.07)
Basophils Absolute: 0 10*3/uL (ref 0.0–0.1)
Basophils Relative: 0 %
Eosinophils Absolute: 0.2 10*3/uL (ref 0.0–0.5)
Eosinophils Relative: 2 %
HCT: 41.9 % (ref 39.0–52.0)
Hemoglobin: 14.6 g/dL (ref 13.0–17.0)
Immature Granulocytes: 0 %
Lymphocytes Relative: 10 %
Lymphs Abs: 1.1 10*3/uL (ref 0.7–4.0)
MCH: 31.3 pg (ref 26.0–34.0)
MCHC: 34.8 g/dL (ref 30.0–36.0)
MCV: 89.7 fL (ref 80.0–100.0)
Monocytes Absolute: 1.2 10*3/uL — ABNORMAL HIGH (ref 0.1–1.0)
Monocytes Relative: 11 %
Neutro Abs: 8.6 10*3/uL — ABNORMAL HIGH (ref 1.7–7.7)
Neutrophils Relative %: 77 %
Platelets: 175 10*3/uL (ref 150–400)
RBC: 4.67 MIL/uL (ref 4.22–5.81)
RDW: 12.7 % (ref 11.5–15.5)
WBC: 11.1 10*3/uL — ABNORMAL HIGH (ref 4.0–10.5)
nRBC: 0 % (ref 0.0–0.2)

## 2021-05-12 LAB — BASIC METABOLIC PANEL
Anion gap: 5 (ref 5–15)
BUN: 14 mg/dL (ref 8–23)
CO2: 28 mmol/L (ref 22–32)
Calcium: 9 mg/dL (ref 8.9–10.3)
Chloride: 102 mmol/L (ref 98–111)
Creatinine, Ser: 0.89 mg/dL (ref 0.61–1.24)
GFR, Estimated: >60 mL/min (ref >60)
Glucose, Bld: 115 mg/dL — ABNORMAL HIGH (ref 70–99)
Potassium: 4.8 mmol/L (ref 3.5–5.1)
Sodium: 135 mmol/L (ref 135–145)

## 2021-05-12 MED ORDER — FENTANYL CITRATE (PF) 100 MCG/2ML IJ SOLN
50.0000 ug | Freq: Once | INTRAMUSCULAR | Status: AC
  Administered 2021-05-12: 50 ug via INTRAVENOUS
  Filled 2021-05-12: qty 2

## 2021-05-12 MED ORDER — OXYCODONE-ACETAMINOPHEN 5-325 MG PO TABS: 1.0000 | ORAL_TABLET | Freq: Four times a day (QID) | ORAL | 0 refills | Status: DC | PRN

## 2021-05-12 MED ORDER — OXYCODONE-ACETAMINOPHEN 5-325 MG PO TABS
1.0000 | ORAL_TABLET | Freq: Once | ORAL | Status: AC
  Administered 2021-05-12: 1 via ORAL
  Filled 2021-05-12: qty 1

## 2021-05-12 MED ORDER — METHOCARBAMOL 500 MG PO TABS: 500.0000 mg | ORAL_TABLET | Freq: Three times a day (TID) | ORAL | 0 refills | Status: DC | PRN

## 2021-05-12 MED ORDER — IOHEXOL 300 MG/ML  SOLN
75.0000 mL | Freq: Once | INTRAMUSCULAR | Status: AC | PRN
  Administered 2021-05-12: 75 mL via INTRAVENOUS

## 2021-05-12 NOTE — ED Notes (Signed)
Patient transported to CT 

## 2021-05-12 NOTE — ED Triage Notes (Signed)
Patient fell of ladder onto R hip. Attempted heat and cold, swelling started. No pain medications attempted. No numbness or tingling noted.

## 2021-05-12 NOTE — Discharge Instructions (Addendum)
If you develop worsening swelling, any lightheadedness, passing out, chest pain or difficulty breathing or other new concerning symptom, come back to ER for reassessment.  Recommend icing.  For tonight only, would recommend holding your blood thinner.  Can resume tomorrow.

## 2021-05-12 NOTE — ED Provider Notes (Signed)
[facility] EMERGENCY DEPARTMENT Provider Note   CSN: [id number] Arrival date & time: 05/12/21  1431     History Chief Complaint  Patient presents with   Francis Shaw is a 80 y.o. male.  Presents to the emergency room with concern for fall.  Patient was about 6 feet up on a ladder when he fell and landed on his right hip.  Has not had any pain medicine yet.  No numbness or weakness.  Thinks he may have hit his head.  No LOC.  No chest pain or abdominal pain.  No fevers chills.  On Eliquis for A. fib.  HPI     Past Medical History:  Diagnosis Date   Abnormal EKG    hx of LBBB on ekg   Allergy    Arthritis    left hip   Atrial fibrillation (HCC)    a. Eliquis initiated 04/2013. b. on Xarelto 2020   Atrial flutter ([place])    Esophageal reflux 04/12/2013   Hypertension    Nonischemic cardiomyopathy ([place])    a. presumed to be tachy mediated;  b. 03/2013 Echo: EF 30-35%, diff HK, mild LVH, mildly dil LA.   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 06/28/2017   Plantar wart, right foot 03/26/2017   Cough 05/24/2015   Allergic rhinitis 12/06/2014   Bruit 11/02/2014   Shingles 05/10/2014   Nonischemic cardiomyopathy ([place])    Atrial fibrillation ([place])    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   Esophageal reflux 04/12/2013   Right hip pain 03/04/2013   Dermatitis 11/22/2012   Hyperlipidemia 03/05/2012   Arthritis of shoulder 11/26/2011   Skin lesion 11/26/2011    Preventative health care 11/26/2011    Past Surgical History:  Procedure Laterality Date   CALCANEAL OSTEOTOMY Right 1993   "stapled it; screwed it" (04/28/2013)   CALCANEUS HARDWARE REMOVAL Right ~ 1994   COLONOSCOPY     10-12 years ago; unable to find records   COLONOSCOPY  2021   CYSTOSCOPY WITH INSERTION OF UROLIFT N/A 12/19/2020   Procedure: CYSTOSCOPY WITH INSERTION OF UROLIFT;  Surgeon: Festus Aloe, MD;  [facility];  Service: Urology;  Laterality: N/A;   FINGER AMPUTATION Right 1996   index (04/28/2013)   LEFT HEART CATHETERIZATION WITH CORONARY ANGIOGRAM N/A 09/30/2013   Procedure: LEFT HEART CATHETERIZATION WITH CORONARY ANGIOGRAM;  Surgeon: Burnell Blanks, MD;  [facility];  Service: Cardiovascular;  Laterality: N/A;   TONSILLECTOMY  1940's   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   Procedure: TOTAL HIP ARTHROPLASTY ANTERIOR APPROACH;  Surgeon: Frederik Pear, MD;  Location: [facility];  Service: Orthopedics;  Laterality: Left;       Family History  Problem Relation Age of Onset   Stroke Father    Asthma Mother        low back pain   Colon cancer Neg Hx    Esophageal cancer Neg Hx    Rectal cancer  Neg Hx    Stomach cancer Neg Hx     Social History   Tobacco Use   Smoking status: Former    Packs/day: 1.00    Years: 18.00    Pack years: 18.00    Types: Cigarettes    Quit date: 09/24/1979    Years since quitting: 41.6   Smokeless tobacco: Never  Vaping Use   Vaping Use: Never used  Substance Use Topics   Alcohol use: Yes    Alcohol/week: 0.0 standard drinks    Comment: rare   Drug use: No    Home Medications Prior to Admission medications   Medication Sig Start Date End Date Taking? Authorizing Provider  methocarbamol (ROBAXIN) 500 MG tablet Take 1 tablet (500 mg total) by mouth every 8 (eight) hours as needed for muscle spasms. 05/12/21  Yes Lucrezia Starch, MD  oxyCODONE-acetaminophen (PERCOCET/ROXICET) 5-325 MG  tablet Take 1 tablet by mouth every 6 (six) hours as needed for up to 3 days for severe pain. 05/12/21 05/15/21 Yes Lucrezia Starch, MD  b complex vitamins capsule Take 1 capsule by mouth daily.    [provider]  carvedilol (COREG) 25 MG tablet TAKE 1 TABLET BY MOUTH TWICE A DAY 04/30/21   Lelon Perla, MD  Cholecalciferol (VITAMIN D3 PO) Take by mouth daily.    [provider]  fluticasone (FLONASE) 50 MCG/ACT nasal spray Place 2 sprays into both nostrils daily as needed for allergies or rhinitis.    [provider]  loratadine (CLARITIN) 10 MG tablet TAKE 1 TABLET (10 MG TOTAL) BY MOUTH 2 (TWO) TIMES DAILY AS NEEDED FOR ALLERGIES. 03/13/20   Mosie Lukes, MD  losartan (COZAAR) 25 MG tablet TAKE 1 TABLET BY MOUTH EVERY DAY 12/21/20   Lelon Perla, MD  losartan (COZAAR) 50 MG tablet Take 1 tablet (50 mg total) by mouth daily. Patient taking differently: Take 50 mg by mouth. 10/18/20   Lelon Perla, MD  Multiple Vitamins-Minerals (CENTRUM SILVER PO) Take 1 tablet by mouth daily.    [provider]  tamsulosin (FLOMAX) 0.4 MG CAPS capsule Take 0.4 mg by mouth as needed. 05/16/18   [provider]  XARELTO 20 MG TABS tablet TAKE 1 TABLET BY MOUTH DAILY WITH SUPPER 05/10/21   Lelon Perla, MD    Allergies    Patient has no known allergies.  Review of Systems   Review of Systems  Constitutional:  Negative for chills and fever.  HENT:  Negative for ear pain and sore throat.   Eyes:  Negative for pain and visual disturbance.  Respiratory:  Negative for cough and shortness of breath.   Cardiovascular:  Negative for chest pain and palpitations.  Gastrointestinal:  Negative for abdominal pain and vomiting.  Genitourinary:  Negative for dysuria and hematuria.  Musculoskeletal:  Positive for arthralgias. Negative for back pain.  Skin:  Negative for color change and rash.  Neurological:  Negative for seizures and syncope.  All other  systems reviewed and are negative.  Physical Exam Updated Vital Signs BP (!) 146/91 (BP Location: Left Arm)   Pulse 72   Temp (!) 97.4 F (36.3 C) (Oral)   Resp 16   Ht '5\' 11"'$  (1.803 m)   Wt 82.6 kg   SpO2 97%   BMI 25.38 kg/m   Physical Exam Vitals and nursing note reviewed.  Constitutional:      Appearance: He is well-developed.  HENT:     Head: Normocephalic and  atraumatic.  Eyes:     Conjunctiva/sclera: Conjunctivae normal.  Cardiovascular:     Rate and Rhythm: Normal rate and regular rhythm.     Heart sounds: No murmur heard. Pulmonary:     Effort: Pulmonary effort is normal. No respiratory distress.     Breath sounds: Normal breath sounds.  Abdominal:     Palpations: Abdomen is soft.     Tenderness: There is no abdominal tenderness.     Comments: Tenderness noted in the right flank, right lower quadrant of abdomen, no ecchymosis or hematoma noted  Musculoskeletal:     Cervical back: Neck supple.     Comments: Back: no C, T, L spine TTP, no step off or deformity RUE: no TTP throughout, no deformity, normal joint ROM, radial pulse intact, distal sensation and motor intact LUE: no TTP throughout, no deformity, normal joint ROM, radial pulse intact, distal sensation and motor intact RLE: Some tenderness to the right hip, right buttocks region, normal joint ROM, distal pulse, sensation and motor intact LLE: no TTP throughout, no deformity, normal joint ROM, distal pulse, sensation and motor intact  Skin:    General: Skin is warm and dry.  Neurological:     Mental Status: He is alert.    ED Results / Procedures / Treatments   Labs (all labs ordered are listed, but only abnormal results are displayed) Labs Reviewed  CBC WITH DIFFERENTIAL/PLATELET - Abnormal; Notable for the following components:      Result Value   WBC 11.1 (*)    Neutro Abs 8.6 (*)    Monocytes Absolute 1.2 (*)    All other components within normal limits  BASIC METABOLIC PANEL - Abnormal;  Notable for the following components:   Glucose, Bld 115 (*)    All other components within normal limits    EKG None  Radiology CT HEAD WO CONTRAST (5MM)  Result Date: 05/12/2021 CLINICAL DATA:  Fall from ladder EXAM: CT HEAD WITHOUT CONTRAST TECHNIQUE: Contiguous axial images were obtained from the base of the skull through the vertex without intravenous contrast. COMPARISON:  None. FINDINGS: Brain: There is atrophy and chronic small vessel disease changes. No acute intracranial abnormality. Specifically, no hemorrhage, hydrocephalus, mass lesion, acute infarction, or significant intracranial injury. Vascular: No hyperdense vessel or unexpected calcification. Skull: No acute calvarial abnormality. Sinuses/Orbits: Visualized paranasal sinuses and mastoids clear. Orbital soft tissues unremarkable. Other: None IMPRESSION: Atrophy, chronic microvascular disease. No acute intracranial abnormality. Electronically Signed   By: Rolm Baptise M.D.   On: 05/12/2021 16:48   CT ABDOMEN PELVIS W CONTRAST  Result Date: 05/12/2021 CLINICAL DATA:  Fall from ladder. EXAM: CT ABDOMEN AND PELVIS WITH CONTRAST TECHNIQUE: Multidetector CT imaging of the abdomen and pelvis was performed using the standard protocol following bolus administration of intravenous contrast. CONTRAST:  70m OMNIPAQUE IOHEXOL 300 MG/ML  SOLN COMPARISON:  03/05/2018 FINDINGS: Lower chest: No acute abnormality. Hepatobiliary: No hepatic injury or perihepatic hematoma. Gallbladder is unremarkable Pancreas: No focal abnormality or ductal dilatation. Spleen: No splenic injury or perisplenic hematoma. Calcifications throughout the spleen. Adrenals/Urinary Tract: No adrenal hemorrhage or renal injury identified. Bladder is unremarkable. Multiple nonobstructing small left renal stones. No hydronephrosis. Stomach/Bowel: Colonic diverticulosis. No active diverticulitis. Normal appendix. Stomach and small bowel decompressed, unremarkable.  Vascular/Lymphatic: Aortic atherosclerosis. No evidence of aneurysm or adenopathy. Reproductive: No visible focal abnormality. Other: No free fluid or free air. Musculoskeletal: Prior left hip replacement. No acute bony abnormality. Hematoma noted within the right buttock and hip subcutaneous soft  tissues overlying the gluteus muscles measuring 7.9 x 2.8 cm. There is likely a small intramuscular hematoma within the right gluteus maximus. Overlying subcutaneous stranding. IMPRESSION: Right buttock and gluteus maximus hematoma as described above. No visible acute fracture. No solid organ injury. Left nephrolithiasis. Aortic atherosclerosis. Colonic diverticulosis. Electronically Signed   By: Rolm Baptise M.D.   On: 05/12/2021 16:48    Procedures Procedures   Medications Ordered in ED Medications  fentaNYL (SUBLIMAZE) injection 50 mcg (50 mcg Intravenous Given 05/12/21 1610)  iohexol (OMNIPAQUE) 300 MG/ML solution 75 mL (75 mLs Intravenous Contrast Given 05/12/21 1632)  oxyCODONE-acetaminophen (PERCOCET/ROXICET) 5-325 MG per tablet 1 tablet (1 tablet Oral Given 05/12/21 1720)  oxyCODONE-acetaminophen (PERCOCET/ROXICET) 5-325 MG per tablet 1 tablet (1 tablet Oral Given 05/12/21 1843)    ED Course  I have reviewed the triage vital signs and the nursing notes.  Pertinent labs & imaging results that were available during my care of the patient were reviewed by me and considered in my medical decision making (see chart for details).    MDM Rules/Calculators/A&P                           80 year old male presented to ER after fall off ladder.  Endorsed possible head trauma but primarily having pain at right hip, right buttocks.  On exam did note some tenderness in the right flank and right hip.  CT head negative.  CT abdomen pelvis was concerning for right buttock gluteus maximus hematoma.  Given patient's use of blood thinners, discussed case with Dr. Rosendo Gros on-call for trauma surgery.  Given patient's  hemodynamic stability and normal hemoglobin and well appearance, he recommends outpatient management at present.  Discussed findings with patient, reviewed strict return precautions and discharged home.    After the discussed management above, the patient was determined to be safe for discharge.  The patient was in agreement with this plan and all questions regarding their care were answered.  ED return precautions were discussed and the patient will return to the ED with any significant worsening of condition.  Final Clinical Impression(s) / ED Diagnoses Final diagnoses:  Traumatic hematoma of buttock, initial encounter    Rx / DC Orders ED Discharge Orders          Ordered    methocarbamol (ROBAXIN) 500 MG tablet  Every 8 hours PRN        05/12/21 1718    oxyCODONE-acetaminophen (PERCOCET/ROXICET) 5-325 MG tablet  Every 6 hours PRN        05/12/21 1718             Lucrezia Starch, MD 05/12/21 2008

## 2021-05-12 NOTE — ED Notes (Signed)
Patient Alert and oriented to baseline. Stable and ambulatory to baseline. Patient verbalized understanding of the discharge instructions.  Patient belongings were taken by the patient.   

## 2021-05-12 NOTE — ED Notes (Signed)
Takes Xarelto Has bruising and swelling over right lateral ribs Swelling on right hip Superficial scratches on right lower leg.

## 2021-05-15 ENCOUNTER — Other Ambulatory Visit: Payer: Self-pay

## 2021-05-15 ENCOUNTER — Ambulatory Visit (INDEPENDENT_AMBULATORY_CARE_PROVIDER_SITE_OTHER): Payer: Medicare HMO | Admitting: Internal Medicine

## 2021-05-15 ENCOUNTER — Encounter: Payer: Self-pay | Admitting: Internal Medicine

## 2021-05-15 ENCOUNTER — Encounter: Payer: Self-pay | Admitting: Family Medicine

## 2021-05-15 VITALS — BP 116/78 | HR 76 | Temp 98.0°F | Resp 16 | Ht 71.0 in | Wt 186.5 lb

## 2021-05-15 DIAGNOSIS — S20221D Contusion of right back wall of thorax, subsequent encounter: Secondary | ICD-10-CM

## 2021-05-15 DIAGNOSIS — S300XXD Contusion of lower back and pelvis, subsequent encounter: Secondary | ICD-10-CM

## 2021-05-15 DIAGNOSIS — W19XXXD Unspecified fall, subsequent encounter: Secondary | ICD-10-CM

## 2021-05-15 MED ORDER — HYDROCODONE-ACETAMINOPHEN 7.5-325 MG PO TABS: 1.0000 | ORAL_TABLET | Freq: Three times a day (TID) | ORAL | 0 refills | Status: DC | PRN

## 2021-05-15 MED ORDER — BACLOFEN 5 MG PO TABS: 5.0000 mg | ORAL_TABLET | Freq: Three times a day (TID) | ORAL | 0 refills | Status: DC | PRN

## 2021-05-15 NOTE — Progress Notes (Signed)
Subjective:    Patient ID: Francis Shaw, male    DOB: 23-Aug-1941, 80 y.o.   MRN: [id number]  DOS:  05/15/2021 Type of visit - description: ER follow-up  Francis Shaw to the ER 05/12/2021: [place] from 6 feet when he was up on a ladder, landed on his right hip. He denies LOC.  ER work up: Creatinine okay, CBC okay, CT head no acute, CT abdomen and pelvis: Right buttock and gluteus maximus hematoma, no fractures noted.  He was released home. Since then, he complains of right-sided back muscle spasm, severe when he moves. Denies any headache. No nausea or vomiting Appetite is good No blood in the urine.  No blood in the stools. No neck pain per se. No lower extremity paresthesia  Review of Systems See above   Past Medical History:  Diagnosis Date   Abnormal EKG    hx of LBBB on ekg   Allergy    Arthritis    left hip   Atrial fibrillation (HCC)    a. Eliquis initiated 04/2013. b. on Xarelto 2020   Atrial flutter ([place])    Esophageal reflux 04/12/2013   Hypertension    Nonischemic cardiomyopathy ([place])    a. presumed to be tachy mediated;  b. 03/2013 Echo: EF 30-35%, diff HK, mild LVH, mildly dil LA.   Pericarditis 60 yrs ago age 58   Plantar wart, right foot 03/26/2017   Wears glasses     Past Surgical History:  Procedure Laterality Date   CALCANEAL OSTEOTOMY Right 1993   "stapled it; screwed it" (04/28/2013)   CALCANEUS HARDWARE REMOVAL Right ~ 1994   COLONOSCOPY     10-12 years ago; unable to find records   COLONOSCOPY  2021   CYSTOSCOPY WITH INSERTION OF UROLIFT N/A 12/19/2020   Procedure: CYSTOSCOPY WITH INSERTION OF UROLIFT;  Surgeon: Festus Aloe, MD;  Location: [facility];  Service: Urology;  Laterality: N/A;   FINGER AMPUTATION Right 1996   index (04/28/2013)   LEFT HEART CATHETERIZATION WITH CORONARY ANGIOGRAM N/A 09/30/2013   Procedure: LEFT HEART CATHETERIZATION WITH CORONARY ANGIOGRAM;  Surgeon: Burnell Blanks, MD;  Location: [facility] CATH LAB;   Service: Cardiovascular;  Laterality: N/A;   TONSILLECTOMY  1940's   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 If you have any questions, ask your nurse or doctor.          b complex vitamins capsule Take 1 capsule by mouth daily.   carvedilol 25 MG tablet Commonly known as: COREG TAKE 1 TABLET BY MOUTH TWICE A DAY   CENTRUM SILVER PO Take 1 tablet by mouth daily.   fluticasone 50 MCG/ACT nasal spray Commonly known as: FLONASE Place 2 sprays into both nostrils daily as needed for allergies or rhinitis.   loratadine 10 MG tablet Commonly known as: CLARITIN TAKE 1 TABLET (10 MG TOTAL) BY MOUTH 2 (TWO) TIMES DAILY AS NEEDED FOR ALLERGIES.   losartan 50 MG tablet Commonly known as: COZAAR Take 1 tablet (50 mg total) by mouth daily. What changed: when to take this   losartan 25 MG tablet Commonly known as: COZAAR TAKE 1 TABLET BY MOUTH EVERY DAY What changed:  Another medication with the same name was changed. Make sure you understand how and when to take each.   methocarbamol 500 MG tablet Commonly known as: ROBAXIN Take 1 tablet (500 mg total) by mouth every 8 (eight) hours as needed for muscle spasms.   oxyCODONE-acetaminophen 5-325 MG tablet Commonly known as: PERCOCET/ROXICET Take 1 tablet by mouth every 6 (six) hours as needed for up to 3 days for severe pain.   tamsulosin 0.4 MG Caps capsule Commonly known as: FLOMAX Take 0.4 mg by mouth as needed.   VITAMIN D3 PO Take by mouth daily.   Xarelto 20 MG Tabs tablet Generic drug: rivaroxaban TAKE 1 TABLET BY MOUTH DAILY WITH SUPPER           Objective:   Physical Exam Skin:         Comments: TTP at the right lower back, no CVA tenderness,  no TTP at the thoracic lumbar spine.  No hematomas noted.   BP 116/78 (BP Location: Left Arm, Patient Position: Sitting, Cuff Size: Small)   Pulse 76   Temp 98 F (36.7 C) (Oral)   Resp 16   Ht '5\' 11"'$  (1.803 m)   Wt 186 lb 8 oz (84.6 kg)   SpO2 97%   BMI 26.01 kg/m  General:   Well developed, using a walker, he seems to be in pain when he moves, complains of back spasms. HEENT:  Normocephalic . Face symmetric, atraumatic Lungs:  CTA B Normal respiratory effort, no intercostal retractions, no accessory muscle use. Heart: Regular? Abdomen:  Not distended, soft, non-tender. No rebound or rigidity.   Skin: Not pale. Not jaundice Lower extremities: no pretibial edema bilaterally MSK: Hip rotation normal. Neurologic:  alert & oriented X3.  Speech normal, gait appropriate for age and unassisted Psych--  Cognition and judgment appear intact.  Cooperative with normal attention span and concentration.  Behavior appropriate. No anxious or depressed appearing.     Assessment     80 year old gentleman PMH include cardiomyopathy, high cholesterol, HTN, atrial fibrillation, GERD, on Xarelto, presents for a ER follow-up  Fall, subsequent encounter. Right lower back contusion: He is anticoagulated. CT abdomen and pelvis no acute, patient denies hematuria or abdominal pain per se. His main concern is  muscle spasm associated with movement. Oxycodone is not particularly helpful. Plan: CBC, if significant drop on hemoglobin consider rescan. Switch to hydrocodone Stop methocarbamol, start baclofen. Strongly recommend avoidance of risky situations that could trigger falls See PCP in 3 weeks, sooner if needed   This visit occurred during the SARS-CoV-2 public health emergency.  Safety protocols were in place, including screening questions prior to the visit, additional usage of staff PPE, and extensive cleaning of exam room while observing appropriate contact time as indicated for  disinfecting solutions.

## 2021-05-15 NOTE — Telephone Encounter (Signed)
Appt scheduled w. Dr. Larose Kells this afternoon.

## 2021-05-15 NOTE — Patient Instructions (Addendum)
Pain management: Stop oxycodone and methocarbamol  Take a painkiller 3 times a day. You can take either Tylenol 500 mg two tablets or hydrocodone (it has Tylenol in it already).  Take baclofen, muscle relaxant as needed.  Watch for drowsiness.  Schedule a checkup in 3 weeks

## 2021-05-16 LAB — CBC WITH DIFFERENTIAL/PLATELET
Basophils Absolute: 0.1 10*3/uL (ref 0.0–0.1)
Basophils Relative: 1.3 % (ref 0.0–3.0)
Eosinophils Absolute: 0.2 10*3/uL (ref 0.0–0.7)
Eosinophils Relative: 2.4 % (ref 0.0–5.0)
HCT: 37.8 % — ABNORMAL LOW (ref 39.0–52.0)
Hemoglobin: 12.8 g/dL — ABNORMAL LOW (ref 13.0–17.0)
Lymphocytes Relative: 10.6 % — ABNORMAL LOW (ref 12.0–46.0)
Lymphs Abs: 1.1 10*3/uL (ref 0.7–4.0)
MCHC: 33.9 g/dL (ref 30.0–36.0)
MCV: 90.1 fl (ref 78.0–100.0)
Monocytes Absolute: 1.5 10*3/uL — ABNORMAL HIGH (ref 0.1–1.0)
Monocytes Relative: 14 % — ABNORMAL HIGH (ref 3.0–12.0)
Neutro Abs: 7.4 10*3/uL (ref 1.4–7.7)
Neutrophils Relative %: 71.7 % (ref 43.0–77.0)
Platelets: 187 10*3/uL (ref 150.0–400.0)
RBC: 4.2 Mil/uL — ABNORMAL LOW (ref 4.22–5.81)
RDW: 13.1 % (ref 11.5–15.5)
WBC: 10.4 10*3/uL (ref 4.0–10.5)

## 2021-05-19 ENCOUNTER — Encounter: Payer: Self-pay | Admitting: Internal Medicine

## 2021-05-21 ENCOUNTER — Telehealth: Payer: Self-pay | Admitting: Internal Medicine

## 2021-05-22 ENCOUNTER — Ambulatory Visit (HOSPITAL_BASED_OUTPATIENT_CLINIC_OR_DEPARTMENT_OTHER)
Admission: RE | Admit: 2021-05-22 | Discharge: 2021-05-22 | Disposition: A | Discharge status: Home / Self Care | Payer: Medicare HMO | Source: Ambulatory Visit | Attending: Internal Medicine | Admitting: Internal Medicine

## 2021-05-22 ENCOUNTER — Other Ambulatory Visit: Payer: Self-pay | Admitting: Internal Medicine

## 2021-05-22 ENCOUNTER — Other Ambulatory Visit: Payer: Self-pay

## 2021-05-22 ENCOUNTER — Ambulatory Visit (INDEPENDENT_AMBULATORY_CARE_PROVIDER_SITE_OTHER): Payer: Medicare HMO | Admitting: Internal Medicine

## 2021-05-22 ENCOUNTER — Encounter: Payer: Self-pay | Admitting: Internal Medicine

## 2021-05-22 VITALS — BP 116/78 | HR 82 | Temp 98.1°F | Resp 18 | Ht 71.0 in | Wt 189.0 lb

## 2021-05-22 DIAGNOSIS — S20221D Contusion of right back wall of thorax, subsequent encounter: Secondary | ICD-10-CM | POA: Diagnosis not present

## 2021-05-22 DIAGNOSIS — S2241XD Multiple fractures of ribs, right side, subsequent encounter for fracture with routine healing: Secondary | ICD-10-CM | POA: Diagnosis not present

## 2021-05-22 DIAGNOSIS — S20211D Contusion of right front wall of thorax, subsequent encounter: Secondary | ICD-10-CM | POA: Insufficient documentation

## 2021-05-22 DIAGNOSIS — Y929 Unspecified place or not applicable: Secondary | ICD-10-CM | POA: Diagnosis not present

## 2021-05-22 DIAGNOSIS — W19XXXD Unspecified fall, subsequent encounter: Secondary | ICD-10-CM | POA: Diagnosis not present

## 2021-05-22 DIAGNOSIS — Y939 Activity, unspecified: Secondary | ICD-10-CM | POA: Insufficient documentation

## 2021-05-22 DIAGNOSIS — W11XXXD Fall on and from ladder, subsequent encounter: Secondary | ICD-10-CM | POA: Diagnosis not present

## 2021-05-22 DIAGNOSIS — S2241XA Multiple fractures of ribs, right side, initial encounter for closed fracture: Secondary | ICD-10-CM | POA: Diagnosis not present

## 2021-05-22 MED ORDER — HYDROCODONE-ACETAMINOPHEN 7.5-325 MG PO TABS: 1.0000 | ORAL_TABLET | Freq: Three times a day (TID) | ORAL | 0 refills | Status: DC | PRN

## 2021-05-22 NOTE — Patient Instructions (Signed)
Go to the first floor, get x-ray  For now continue hydrocodone 3 times a day as needed.  In a couple of days, if hydrocodone is not controlling your pain let me know.  Okay to use OTC Salonpas patches to put over the area of pain.  Call anytime if fever, chills, increasing difficulty breathing.

## 2021-05-22 NOTE — Telephone Encounter (Signed)
Requesting: hydrocodone 7.5-'325mg'$  Contract: None UDS: None Last Visit: 05/15/2021 w/ Dr. Larose Kells Next Visit: 06/05/2021 w/ Dr. Larose Kells Last Refill: 05/15/2021 #21 and 0RF Pt sig: 1 tab tid prn  Please Advise

## 2021-05-22 NOTE — Progress Notes (Signed)
Subjective:    Patient ID: Francis Shaw, male    DOB: 1941/03/31, 80 y.o.   MRN: [id number]  DOS:  05/22/2021 Type of visit - description: acute  Went to the ER 05/12/2021 after he fell from a ladder. Was seen here by me 05/15/2021. CT abdomen and pelvis show no bone acute changes, but a right buttock on gluteus maximum hematoma  He is here today  because although the right-sided low back pain has improved, he has developed a severe right-sided anterior chest pain. Is worse with certain movements and when he takes deep breaths.  Also has developed ecchymosis at the back.   Review of Systems No fever chills No actual shortness of breath but taking a deep breath is hard due to pain No hemoptysis No blood in the stools or in the urine.  Past Medical History:  Diagnosis Date   Abnormal EKG    hx of LBBB on ekg   Allergy    Arthritis    left hip   Atrial fibrillation (HCC)    a. Eliquis initiated 04/2013. b. on Xarelto 2020   Atrial flutter ([place])    Esophageal reflux 04/12/2013   Hypertension    Nonischemic cardiomyopathy ([place])    a. presumed to be tachy mediated;  b. 03/2013 Echo: EF 30-35%, diff HK, mild LVH, mildly dil LA.   Pericarditis 60 yrs ago age 81   Plantar wart, right foot 03/26/2017   Wears glasses     Past Surgical History:  Procedure Laterality Date   CALCANEAL OSTEOTOMY Right 1993   "stapled it; screwed it" (04/28/2013)   CALCANEUS HARDWARE REMOVAL Right ~ 1994   COLONOSCOPY     10-12 years ago; unable to find records   COLONOSCOPY  2021   CYSTOSCOPY WITH INSERTION OF UROLIFT N/A 12/19/2020   Procedure: CYSTOSCOPY WITH INSERTION OF UROLIFT;  Surgeon: Festus Aloe, MD;  Location: [facility];  Service: Urology;  Laterality: N/A;   FINGER AMPUTATION Right 1996   index (04/28/2013)   LEFT HEART CATHETERIZATION WITH CORONARY ANGIOGRAM N/A 09/30/2013   Procedure: LEFT HEART CATHETERIZATION WITH CORONARY ANGIOGRAM;  Surgeon: Burnell Blanks,  MD;  Location: [facility] CATH LAB;  Service: Cardiovascular;  Laterality: N/A;   TONSILLECTOMY  1940's   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 If you have any questions, ask your nurse or doctor.          b complex vitamins capsule Take 1 capsule by mouth daily.   Baclofen 5 MG Tabs Take 5-10 mg by mouth 3 (three) times daily as needed.   carvedilol 25 MG tablet Commonly known as: COREG TAKE 1 TABLET BY MOUTH TWICE A DAY   CENTRUM SILVER PO Take 1 tablet by mouth daily.   fluticasone 50 MCG/ACT nasal spray Commonly known as: FLONASE Place 2 sprays into both nostrils daily as needed for allergies or rhinitis.   HYDROcodone-acetaminophen 7.5-325 MG tablet Commonly known as: NORCO Take 1 tablet by mouth 3 (three) times daily as needed for moderate pain.   loratadine 10 MG tablet Commonly known as: CLARITIN TAKE 1 TABLET (10 MG TOTAL) BY MOUTH 2 (TWO) TIMES DAILY AS  NEEDED FOR ALLERGIES.   losartan 50 MG tablet Commonly known as: COZAAR Take 1 tablet (50 mg total) by mouth daily. What changed: when to take this   losartan 25 MG tablet Commonly known as: COZAAR TAKE 1 TABLET BY MOUTH EVERY DAY What changed: Another medication with the same name was changed. Make sure you understand how and when to take each.   tamsulosin 0.4 MG Caps capsule Commonly known as: FLOMAX Take 0.4 mg by mouth as needed.   VITAMIN D3 PO Take by mouth daily.   Xarelto 20 MG Tabs tablet Generic drug: rivaroxaban TAKE 1 TABLET BY MOUTH DAILY WITH SUPPER           Objective:   Physical Exam Chest:       Comments: Area of TTP Skin:         Comments: Ecchymosis noted   BP 116/78 (BP Location: Left Arm, Patient  Position: Sitting, Cuff Size: Small)   Pulse 82   Temp 98.1 F (36.7 C) (Oral)   Resp 18   Ht '5\' 11"'$  (1.803 m)   Wt 189 lb (85.7 kg)   SpO2 93%   BMI 26.36 kg/m  General:   Well developed, NAD, BMI noted. HEENT:  Normocephalic . Face symmetric, atraumatic Lungs:  CTA B Normal respiratory effort, no intercostal retractions, no accessory muscle use. Chest wall: + TTP at the right anterior area.  See graphic.  No crepitus or deformities Heart: RRR,  no murmur.  Lower extremities: Hip rotation normal Skin: Not pale. Not jaundice Neurologic:  alert & oriented X3.  Speech normal, gait appropriate for age and unassisted Psych--  Cognition and judgment appear intact.  Cooperative with normal attention span and concentration.  Behavior appropriate. No anxious or depressed appearing.      Assessment     80 year old gentleman PMH include cardiomyopathy, high cholesterol, HTN, atrial fibrillation, GERD, on Xarelto, presents for a ER follow-up  Fall, subsequent encounter. Right lower back contusion, chest wall pain:  Since the last visit, has developed a large ecchymosis, see graphic.  However pain at the area has significantly decreased. Has developed chest wall pain few days ago, suspect a rib fracture (no dedicated chest x-ray done at the ER, had no pain at the chest up until few days ago). Plan: Right rib x-rays, continue pain control with hydrocodone, if not better in the next couple of days he will call, oxycodone?Marland Kitchen Also recommend breathing exercises to prevent ATX. Definitely call if severe pain, difficulty breathing, fever or chills.   This visit occurred during the SARS-CoV-2 public health emergency.  Safety protocols were in place, including screening questions prior to the visit, additional usage of staff PPE, and extensive cleaning of exam room while observing appropriate contact time as indicated for disinfecting solutions.

## 2021-05-22 NOTE — Telephone Encounter (Signed)
See pt's  message, prescription sent.

## 2021-05-28 ENCOUNTER — Other Ambulatory Visit: Payer: Self-pay | Admitting: Internal Medicine

## 2021-05-29 NOTE — Telephone Encounter (Signed)
Requesting: hydrocodone Contract:  UDS: Last Visit: Next Visit: Last Refill:  Please Advise

## 2021-05-31 MED ORDER — HYDROCODONE-ACETAMINOPHEN 7.5-325 MG PO TABS: 1.0000 | ORAL_TABLET | Freq: Three times a day (TID) | ORAL | 0 refills | Status: DC | PRN

## 2021-05-31 NOTE — Telephone Encounter (Signed)
Requesting: hydrocodone Contract: none UDS: none Last Visit: 05/22/2021 w/ Larose Kells Next Visit: 06/05/2021 w/ Larose Kells Last Refill: 05/22/2021 #21 and 0RF  Please Advise

## 2021-06-05 ENCOUNTER — Other Ambulatory Visit: Payer: Self-pay

## 2021-06-05 ENCOUNTER — Ambulatory Visit (INDEPENDENT_AMBULATORY_CARE_PROVIDER_SITE_OTHER): Payer: Medicare HMO | Admitting: Internal Medicine

## 2021-06-05 VITALS — BP 108/60 | HR 73 | Temp 97.6°F | Resp 15 | Ht 71.0 in | Wt 181.0 lb

## 2021-06-05 DIAGNOSIS — W19XXXD Unspecified fall, subsequent encounter: Secondary | ICD-10-CM | POA: Diagnosis not present

## 2021-06-05 DIAGNOSIS — S2231XD Fracture of one rib, right side, subsequent encounter for fracture with routine healing: Secondary | ICD-10-CM

## 2021-06-05 DIAGNOSIS — S20221D Contusion of right back wall of thorax, subsequent encounter: Secondary | ICD-10-CM

## 2021-06-05 NOTE — Progress Notes (Signed)
Subjective:    Patient ID: Francis Shaw, male    DOB: 08-16-1941, 80 y.o.   MRN: [id number]  DOS:  06/05/2021 Type of visit - description: Follow-up  Follow-up from last visit. Was seen here after a fall from a ladder, at the time he had a large hematoma and ecchymosis near the right hip. Also had chest pain and an x-ray confirmed a R rib fracture.  Since then bruising is resolving. Right chest pain has decreased although it can be is still intense when he sneezes or coughs.  Denies headache, no neck pain No leg swelling No fever chills   Review of Systems See above   Past Medical History:  Diagnosis Date   Abnormal EKG    hx of LBBB on ekg   Allergy    Arthritis    left hip   Atrial fibrillation ([place])    a. Eliquis initiated 04/2013. b. on Xarelto 2020   Atrial flutter ([place])    Esophageal reflux 04/12/2013   Hypertension    Nonischemic cardiomyopathy ([place])    a. presumed to be tachy mediated;  b. 03/2013 Echo: EF 30-35%, diff HK, mild LVH, mildly dil LA.   Pericarditis 60 yrs ago age 89   Plantar wart, right foot 03/26/2017   Wears glasses     Past Surgical History:  Procedure Laterality Date   CALCANEAL OSTEOTOMY Right 1993   "stapled it; screwed it" (04/28/2013)   CALCANEUS HARDWARE REMOVAL Right ~ 1994   COLONOSCOPY     10-12 years ago; unable to find records   COLONOSCOPY  2021   CYSTOSCOPY WITH INSERTION OF UROLIFT N/A 12/19/2020   Procedure: CYSTOSCOPY WITH INSERTION OF UROLIFT;  Surgeon: Festus Aloe, MD;  Location[facility];  Service: Urology;  Laterality: N/A;   FINGER AMPUTATION Right 1996   index (04/28/2013)   LEFT HEART CATHETERIZATION WITH CORONARY ANGIOGRAM N/A 09/30/2013   Procedure: LEFT HEART CATHETERIZATION WITH CORONARY ANGIOGRAM;  Surgeon: Burnell Blanks, MD;  Location: 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 If you have any questions, ask your nurse or doctor.          b complex vitamins capsule Take 1 capsule by mouth daily.   Baclofen 5 MG Tabs Take 5-10 mg by mouth 3 (three) times daily as needed.   carvedilol 25 MG tablet Commonly known as: COREG TAKE 1 TABLET BY MOUTH TWICE A DAY   CENTRUM SILVER PO Take 1 tablet by mouth daily.   fluticasone 50 MCG/ACT nasal spray Commonly known as: FLONASE Place 2 sprays into both nostrils daily as needed for allergies or rhinitis.   HYDROcodone-acetaminophen 7.5-325 MG tablet Commonly known as: NORCO Take 1 tablet by mouth 3 (three) times daily as needed for moderate pain.   loratadine 10 MG tablet Commonly known as: CLARITIN TAKE 1 TABLET (10 MG TOTAL) BY MOUTH 2 (TWO) TIMES DAILY AS NEEDED FOR ALLERGIES.   losartan 50 MG tablet Commonly known as: COZAAR Take 1 tablet (50 mg total) by mouth daily. What changed: when to take this  losartan 25 MG tablet Commonly known as: COZAAR TAKE 1 TABLET BY MOUTH EVERY DAY What changed: Another medication with the same name was changed. Make sure you understand how and when to take each.   tamsulosin 0.4 MG Caps capsule Commonly known as: FLOMAX Take 0.4 mg by mouth as needed.   VITAMIN D3 PO Take by mouth daily.   Xarelto 20 MG Tabs tablet Generic drug: rivaroxaban TAKE 1 TABLET BY MOUTH DAILY WITH SUPPER           Objective:   Physical Exam BP 108/60 (BP Location: Left Arm, Patient Position: Sitting, Cuff Size: Normal)   Pulse 73   Temp 97.6 F (36.4 C)   Resp 15   Ht '5\' 11"'$  (1.803 m)   Wt 181 lb (82.1 kg)   SpO2 94%   BMI 25.24 kg/m  General:   Well developed, NAD, BMI noted. HEENT:  Normocephalic . Face symmetric,  atraumatic Lungs:  CTA B Normal respiratory effort, no intercostal retractions, no accessory muscle use. Right chest wall: Much less tender than before Heart: RRR,  no murmur.  Lower extremities: Extensive right-sided hematoma (see previous visit) essentially resolved. Skin: Not pale. Not jaundice Neurologic:  alert & oriented X3.  Speech normal, gait appropriate for age and unassisted Psych--  Cognition and judgment appear intact.  Cooperative with normal attention span and concentration.  Behavior appropriate. No anxious or depressed appearing.      Assessment     80 year old gentleman PMH include cardiomyopathy, high cholesterol, HTN, atrial fibrillation, GERD, on Xarelto, presents for a ER follow-up  Fall, subsequent encounter. Right lower back contusion, R rib fracture: Fortunately the patient is doing better. Recommend to continue hydrocodone only at bedtime for few more days.  Also recommend Tylenol but he states he is not effective. As far as knee injury he is to RTC as needed and follow-up with PCP as previously recommended. Fall prevention: Again encouraged fall prevention, see AVS.   This visit occurred during the SARS-CoV-2 public health emergency.  Safety protocols were in place, including screening questions prior to the visit, additional usage of staff PPE, and extensive cleaning of exam room while observing appropriate contact time as indicated for disinfecting solutions.

## 2021-06-05 NOTE — Patient Instructions (Signed)
Continue hydrocodone at night for few more days     Fall Prevention in the Home, Adult Falls can cause injuries and can affect people from all age groups. There are many simple things that you can do to make your home safe and to help prevent falls. Ask for help when making these changes, if needed. What actions can I take to prevent falls? General instructions Use good lighting in all rooms. Replace any light bulbs that burn out. Turn on lights if it is dark. Use night-lights. Place frequently used items in easy-to-reach places. Lower the shelves around your home if necessary. Set up furniture so that there are clear paths around it. Avoid moving your furniture around. Remove throw rugs and other tripping hazards from the floor. Avoid walking on wet floors. Fix any uneven floor surfaces. Add color or contrast paint or tape to grab bars and handrails in your home. Place contrasting color strips on the first and last steps of stairways. When you use a stepladder, make sure that it is completely opened and that the sides are firmly locked. Have someone hold the ladder while you are using it. Do not climb a closed stepladder. Be aware of any and all pets. What can I do in the bathroom?   Keep the floor dry. Immediately clean up any water that spills onto the floor. Remove soap buildup in the tub or shower on a regular basis. Use non-skid mats or decals on the floor of the tub or shower. Attach bath mats securely with double-sided, non-slip rug tape. If you need to sit down while you are in the shower, use a plastic, non-slip stool. Install grab bars by the toilet and in the tub and shower. Do not use towel bars as grab bars. What can I do in the bedroom? Make sure that a bedside light is easy to reach. Do not use oversized bedding that drapes onto the floor. Have a firm chair that has side arms to use for getting dressed. What can I do in the kitchen? Clean up any spills right away. If  you need to reach for something above you, use a sturdy step stool that has a grab bar. Keep electrical cables out of the way. Do not use floor polish or wax that makes floors slippery. If you must use wax, make sure that it is non-skid floor wax. What can I do in the stairways? Do not leave any items on the stairs. Make sure that you have a light switch at the top of the stairs and the bottom of the stairs. Have them installed if you do not have them. Make sure that there are handrails on both sides of the stairs. Fix handrails that are broken or loose. Make sure that handrails are as long as the stairways. Install non-slip stair treads on all stairs in your home. Avoid having throw rugs at the top or bottom of stairways, or secure the rugs with carpet tape to prevent them from moving. Choose a carpet design that does not hide the edge of steps on the stairway. Check any carpeting to make sure that it is firmly attached to the stairs. Fix any carpet that is loose or worn. What can I do on the outside of my home? Use bright outdoor lighting. Regularly repair the edges of walkways and driveways and fix any cracks. Remove high doorway thresholds. Trim any shrubbery on the main path into your home. Regularly check that handrails are securely fastened and in  good repair. Both sides of any steps should have handrails. Install guardrails along the edges of any raised decks or porches. Clear walkways of debris and clutter, including tools and rocks. Have leaves, snow, and ice cleared regularly. Use sand or salt on walkways during winter months. In the garage, clean up any spills right away, including grease or oil spills. What other actions can I take? Wear closed-toe shoes that fit well and support your feet. Wear shoes that have rubber soles or low heels. Use mobility aids as needed, such as canes, walkers, scooters, and crutches. Review your medicines with your health care provider. Some  medicines can cause dizziness or changes in blood pressure, which increase your risk of falling. Talk with your health care provider about other ways that you can decrease your risk of falls. This may include working with a physical therapist or trainer to improve your strength, balance, and endurance. Where to find more information Centers for Disease Control and Prevention, [employer]: WebmailGuide.co.za [employer] on Aging: BrainJudge.co.uk Contact a health care provider if: You are afraid of falling at home. You feel weak, drowsy, or dizzy at home. You fall at home. Summary There are many simple things that you can do to make your home safe and to help prevent falls. Ways to make your home safe include removing tripping hazards and installing grab bars in the bathroom. Ask for help when making these changes in your home. This information is not intended to replace advice given to you by your health care provider. Make sure you discuss any questions you have with your health care provider. Document Revised: 08/22/2017 Document Reviewed: 04/24/2017 Elsevier Patient Education  2021 [employer].

## 2021-06-14 ENCOUNTER — Other Ambulatory Visit: Payer: Self-pay | Admitting: Family Medicine

## 2021-06-15 MED ORDER — HYDROCODONE-ACETAMINOPHEN 7.5-325 MG PO TABS: 1.0000 | ORAL_TABLET | Freq: Three times a day (TID) | ORAL | 0 refills | Status: DC | PRN

## 2021-06-15 NOTE — Telephone Encounter (Signed)
Patient is requesting a refill of the following medications: Requested Prescriptions   Pending Prescriptions Disp Refills   HYDROcodone-acetaminophen (NORCO) 7.5-325 MG tablet 21 tablet 0    Sig: Take 1 tablet by mouth 3 (three) times daily as needed for moderate pain.    Date of patient request: 06/15/21 Last office visit: 06/05/21 w/Dr. Larose Kells for his fall and right rib Fracture  Date of last refill: 05/31/21 Last refill amount: 21 + 0 Follow up time period per chart: none scheduled USD & Contract: no updated in file  Pt note from pharmacy: "Doing better but hard to sleep at night so I am taking half a pill at badtime"

## 2021-06-25 DIAGNOSIS — I10 Essential (primary) hypertension: Secondary | ICD-10-CM | POA: Diagnosis not present

## 2021-06-25 DIAGNOSIS — N400 Enlarged prostate without lower urinary tract symptoms: Secondary | ICD-10-CM | POA: Diagnosis not present

## 2021-06-25 DIAGNOSIS — J301 Allergic rhinitis due to pollen: Secondary | ICD-10-CM | POA: Diagnosis not present

## 2021-06-25 DIAGNOSIS — G8929 Other chronic pain: Secondary | ICD-10-CM | POA: Diagnosis not present

## 2021-06-25 DIAGNOSIS — D6869 Other thrombophilia: Secondary | ICD-10-CM | POA: Diagnosis not present

## 2021-06-25 DIAGNOSIS — M1990 Unspecified osteoarthritis, unspecified site: Secondary | ICD-10-CM | POA: Diagnosis not present

## 2021-06-25 DIAGNOSIS — Z7722 Contact with and (suspected) exposure to environmental tobacco smoke (acute) (chronic): Secondary | ICD-10-CM | POA: Diagnosis not present

## 2021-06-25 DIAGNOSIS — I4891 Unspecified atrial fibrillation: Secondary | ICD-10-CM | POA: Diagnosis not present

## 2021-06-25 DIAGNOSIS — Z7901 Long term (current) use of anticoagulants: Secondary | ICD-10-CM | POA: Diagnosis not present

## 2021-06-25 DIAGNOSIS — Z79891 Long term (current) use of opiate analgesic: Secondary | ICD-10-CM | POA: Diagnosis not present

## 2021-07-02 DIAGNOSIS — R3912 Poor urinary stream: Secondary | ICD-10-CM | POA: Diagnosis not present

## 2021-07-02 DIAGNOSIS — R972 Elevated prostate specific antigen [PSA]: Secondary | ICD-10-CM | POA: Diagnosis not present

## 2021-07-02 DIAGNOSIS — N401 Enlarged prostate with lower urinary tract symptoms: Secondary | ICD-10-CM | POA: Diagnosis not present

## 2021-08-28 ENCOUNTER — Telehealth: Payer: Self-pay | Admitting: Cardiology

## 2021-08-28 NOTE — Telephone Encounter (Signed)
   [facility] Pre-operative Risk Assessment    Patient Name: Francis Shaw  DOB: 1941/02/15 MRN: [id number]  HEARTCARE STAFF:  - IMPORTANT!!!!!! Under Visit Info/Reason for Call, type in Other and utilize the format Clearance MM/DD/YY or Clearance TBD. Do not use dashes or single digits. - Please review there is not already an duplicate clearance open for this procedure. - If request is for dental extraction, please clarify the # of teeth to be extracted. - If the patient is currently at the dentist's office, call Pre-Op Callback Staff (MA/nurse) to input urgent request.  - If the patient is not currently in the dentist office, please route to the Pre-Op pool.  Request for surgical clearance:  What type of surgery is being performed? 1 possible 2 teeth surgically extracted  When is this surgery scheduled? 09-27-21  What type of clearance is required (medical clearance vs. Pharmacy clearance to hold med vs. Both)? Both  Are there any medications that need to be held prior to surgery and how long? Blood thinner  Practice name and name of physician performing surgery?  Dr Francis Shaw  What is the office phone number? [phone]   7.   What is the office fax number? The same fax as phone number [phone]  8.   Anesthesia type (None, local, MAC, general) ?  Septocaine   Francis Shaw 08/28/2021, 3:08 PM  _________________________________________________________________   (provider comments below)

## 2021-08-29 NOTE — Telephone Encounter (Signed)
   Patient Name: Francis Shaw  DOB: [birth year] MRN: [id number]  Primary Cardiologist: Kirk Ruths, MD  Chart reviewed as part of pre-operative protocol coverage.   Dental extractions of one or two teeth are considered low risk procedures per guidelines and generally do not require any specific cardiac clearance. It is also generally accepted that for extraction of one or two teeth and dental cleanings, there is no need to interrupt blood thinner therapy.   SBE prophylaxis is not required for the patient from a cardiac standpoint.  I will route this recommendation to the requesting party via Epic fax function and remove from pre-op pool.  Please call with questions.  [place], PA 08/29/2021, 8:20 AM

## 2021-11-02 ENCOUNTER — Other Ambulatory Visit: Payer: Self-pay | Admitting: Cardiology

## 2021-11-02 DIAGNOSIS — I428 Other cardiomyopathies: Secondary | ICD-10-CM

## 2021-11-04 DIAGNOSIS — R051 Acute cough: Secondary | ICD-10-CM | POA: Diagnosis not present

## 2021-11-09 ENCOUNTER — Encounter: Payer: Self-pay | Admitting: Family

## 2021-11-09 ENCOUNTER — Other Ambulatory Visit: Payer: Self-pay

## 2021-11-09 ENCOUNTER — Ambulatory Visit (HOSPITAL_BASED_OUTPATIENT_CLINIC_OR_DEPARTMENT_OTHER)
Admission: RE | Admit: 2021-11-09 | Discharge: 2021-11-09 | Disposition: A | Discharge status: Home / Self Care | Payer: Medicare HMO | Source: Ambulatory Visit | Attending: Family | Admitting: Family

## 2021-11-09 ENCOUNTER — Ambulatory Visit (INDEPENDENT_AMBULATORY_CARE_PROVIDER_SITE_OTHER): Payer: Medicare HMO | Admitting: Family

## 2021-11-09 VITALS — BP 123/78 | HR 78 | Temp 98.0°F | Ht 71.0 in | Wt 183.2 lb

## 2021-11-09 DIAGNOSIS — R059 Cough, unspecified: Secondary | ICD-10-CM | POA: Diagnosis not present

## 2021-11-09 DIAGNOSIS — J449 Chronic obstructive pulmonary disease, unspecified: Secondary | ICD-10-CM | POA: Diagnosis not present

## 2021-11-09 DIAGNOSIS — J209 Acute bronchitis, unspecified: Secondary | ICD-10-CM | POA: Diagnosis not present

## 2021-11-09 DIAGNOSIS — J439 Emphysema, unspecified: Secondary | ICD-10-CM | POA: Diagnosis not present

## 2021-11-09 DIAGNOSIS — R062 Wheezing: Secondary | ICD-10-CM

## 2021-11-09 MED ORDER — HYDROCODONE BIT-HOMATROP MBR 5-1.5 MG/5ML PO SOLN: 5.0000 mL | Freq: Three times a day (TID) | ORAL | 0 refills | Status: DC | PRN

## 2021-11-09 MED ORDER — PREDNISONE 20 MG PO TABS: 20.0000 mg | ORAL_TABLET | Freq: Every day | ORAL | 0 refills | Status: DC

## 2021-11-09 MED ORDER — IPRATROPIUM-ALBUTEROL 0.5-2.5 (3) MG/3ML IN SOLN
3.0000 mL | Freq: Once | RESPIRATORY_TRACT | Status: AC
  Administered 2021-11-09: 3 mL via RESPIRATORY_TRACT

## 2021-11-09 MED ORDER — DOXYCYCLINE HYCLATE 100 MG PO TABS: 100.0000 mg | ORAL_TABLET | Freq: Two times a day (BID) | ORAL | 0 refills | Status: DC

## 2021-11-09 NOTE — Progress Notes (Signed)
Francis Shaw is a 81 y.o. 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 06/28/2017   Plantar wart, right foot 03/26/2017   Cough 05/24/2015   Allergic rhinitis 12/06/2014   Bruit 11/02/2014   Shingles 05/10/2014   Nonischemic cardiomyopathy ([place])    Atrial fibrillation (HCC)    Congestive dilated cardiomyopathy ([place]) 04/28/2013   Esophageal reflux 04/12/2013   Right hip pain 03/04/2013   Dermatitis 11/22/2012   Hyperlipidemia 03/05/2012   Arthritis of shoulder 11/26/2011   Skin lesion 11/26/2011   Preventative health care 11/26/2011    Current Outpatient Medications  Medication Sig Dispense Refill   b complex vitamins capsule Take 1 capsule by mouth daily.     carvedilol (COREG) 25 MG tablet Take 1 tablet (25 mg total) by mouth 2 (two) times daily. Schedule an appointment with cardiology for future refills, 1st attempt 60 tablet 0   Cholecalciferol (VITAMIN D3 PO) Take by mouth daily.     doxycycline (VIBRA-TABS) 100 MG tablet Take 1 tablet (100 mg total) by mouth 2 (two) times daily. 14 tablet 0   fluticasone (FLONASE) 50 MCG/ACT nasal spray Place 2 sprays into both nostrils daily as needed for allergies or rhinitis.     HYDROcodone bit-homatropine (HYCODAN) 5-1.5 MG/5ML syrup Take 5 mLs by mouth every 8 (eight) hours as needed for cough. 120 mL 0   loratadine (CLARITIN) 10 MG tablet TAKE 1 TABLET (10 MG TOTAL) BY MOUTH 2 (TWO) TIMES DAILY AS NEEDED FOR ALLERGIES. 180 tablet 1   losartan (COZAAR) 50 MG tablet Take 1 tablet (50 mg  total) by mouth daily. 90 tablet 3   Multiple Vitamins-Minerals (CENTRUM SILVER PO) Take 1 tablet by mouth daily.     predniSONE (DELTASONE) 20 MG tablet Take 1 tablet (20 mg total) by mouth daily with breakfast. 5 tablet 0   XARELTO 20 MG TABS tablet TAKE 1 TABLET BY MOUTH DAILY WITH SUPPER 90 tablet 1   Baclofen 5 MG TABS Take 5-10 mg by mouth 3 (three) times daily as needed. (Patient not taking: Reported on 11/09/2021) 40 tablet 0   HYDROcodone-acetaminophen (NORCO) 7.5-325 MG tablet Take 1 tablet by mouth 3 (three) times daily as needed for moderate pain. (Patient not taking: Reported on 11/09/2021) 21 tablet 0   tamsulosin (FLOMAX) 0.4 MG CAPS capsule Take 0.4 mg by mouth as needed. (Patient not taking: Reported on 11/09/2021)  11   No current facility-administered medications for this visit.    Allergies: Patient has no known allergies.  Past Medical History:  Diagnosis Date   Abnormal EKG    hx of LBBB on ekg   Allergy    Arthritis    left hip   Atrial fibrillation (HCC)    a. Eliquis initiated 04/2013. b. on Xarelto 2020   Atrial flutter ([place])    Esophageal reflux 04/12/2013   Hypertension    Nonischemic cardiomyopathy ([place])    a. presumed to be tachy mediated;  b. 03/2013 Echo: EF 30-35%, diff HK, mild LVH, mildly dil LA.   Pericarditis 60 yrs ago  age 54   Plantar wart, right foot 03/26/2017   Wears glasses     Past Surgical History:  Procedure Laterality Date   CALCANEAL OSTEOTOMY Right 1993   "stapled it; screwed it" (04/28/2013)   CALCANEUS HARDWARE REMOVAL Right ~ 1994   COLONOSCOPY     10-12 years ago; unable to find records   COLONOSCOPY  2021   CYSTOSCOPY WITH INSERTION OF UROLIFT N/A 12/19/2020   Procedure: CYSTOSCOPY WITH INSERTION OF UROLIFT;  Surgeon: Festus Aloe, MD;  Location: [facility];  Service: Urology;  Laterality: N/A;   FINGER AMPUTATION Right 1996   index (04/28/2013)   LEFT HEART CATHETERIZATION WITH CORONARY ANGIOGRAM N/A 09/30/2013    Procedure: LEFT HEART CATHETERIZATION WITH CORONARY ANGIOGRAM;  Surgeon: Burnell Blanks, MD;  [facility];  Service: Cardiovascular;  Laterality: N/A;   TONSILLECTOMY  1940's   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   Procedure: TOTAL HIP ARTHROPLASTY ANTERIOR APPROACH;  Surgeon: Frederik Pear, MD;  Location: [place];  Service: Orthopedics;  Laterality: Left;    Family History  Problem Relation Age of Onset   Stroke Father    Asthma Mother        low back pain   Colon cancer Neg Hx    Esophageal cancer Neg Hx    Rectal cancer Neg Hx    Stomach cancer Neg Hx     Social History   Tobacco Use   Smoking status: Former    Packs/day: 1.00    Years: 18.00    Pack years: 18.00    Types: Cigarettes    Quit date: 09/24/1979    Years since quitting: 42.1   Smokeless tobacco: Never  Substance Use Topics   Alcohol use: Yes    Alcohol/week: 0.0 standard drinks    Comment: rare    Subjective:  Cough x 10 days; wheezing; was seen at [facility] on Sunday and given Z-pak with no benefit;  No weight gain/ swelling in his extremities;  No fever, no shortness of breath; negative COVID test earlier this week;      Objective:  Vitals:   11/09/21 0825  BP: 123/78  Pulse: 78  Temp: 98 F (36.7 C)  SpO2: 100%  Weight: 183 lb 3.2 oz (83.1 kg)  Height: 5\' 11"  (1.803 m)    General: Well developed, well nourished, in no acute distress  Skin : Warm and dry.  Head: Normocephalic and atraumatic  Eyes: Sclera and conjunctiva clear; pupils round and reactive to light; extraocular movements intact  Ears: External normal; canals clear; tympanic membranes normal  Oropharynx: Pink, supple. No suspicious lesions  Neck: Supple without thyromegaly, adenopathy  Lungs: Respirations unlabored; coarse breath sounds in all 4 lobes- some improvement noted after nebulizer treatment given in office today CVS exam: normal rate and regular rhythm.  Neurologic: Alert and oriented; speech intact; face  symmetrical; moves all extremities well; CNII-XII intact without focal deficit   Assessment:  1. Wheezing   2. Cough, unspecified type   3. Acute bronchitis, unspecified organism     Plan:  Nebulizer treatment given in office with some benefit; will update CXR today- concern for possible pneumonia in LLL; Rx for Doxycycline, Prednisone and Hycodan; increase fluids, rest and follow up to be determined based on CXR results;  This visit occurred during the SARS-CoV-2 public health emergency.  Safety protocols were in place, including screening questions prior to the visit, additional usage of staff PPE, and extensive cleaning of exam room while  observing appropriate contact time as indicated for disinfecting solutions.    No follow-ups on file.  Orders Placed This Encounter  Procedures   DG Chest 2 View    Standing Status:   Future    Number of Occurrences:   1    Standing Expiration Date:   11/09/2022    Order Specific Question:   Reason for Exam (SYMPTOM  OR DIAGNOSIS REQUIRED)    Answer:   cough/ wheezing    Order Specific Question:   Preferred imaging location?    Answer:   [facility]    Requested Prescriptions   Signed Prescriptions Disp Refills   doxycycline (VIBRA-TABS) 100 MG tablet 14 tablet 0    Sig: Take 1 tablet (100 mg total) by mouth 2 (two) times daily.   predniSONE (DELTASONE) 20 MG tablet 5 tablet 0    Sig: Take 1 tablet (20 mg total) by mouth daily with breakfast.   HYDROcodone bit-homatropine (HYCODAN) 5-1.5 MG/5ML syrup 120 mL 0    Sig: Take 5 mLs by mouth every 8 (eight) hours as needed for cough.

## 2021-11-12 ENCOUNTER — Telehealth: Payer: Self-pay | Admitting: *Deleted

## 2021-11-12 NOTE — Telephone Encounter (Signed)
-----   Message from Marrian Salvage, [place] sent at 11/12/2021 10:15 AM EST ----- Please call to check on him today; CXR shows COPD but no pneumonia; as long as he is feeling better, no specific follow up needed at this time.

## 2021-11-12 NOTE — Telephone Encounter (Signed)
Spoke with patient and he was given results.  He stated that he feels a whole lot better.

## 2021-11-14 ENCOUNTER — Other Ambulatory Visit: Payer: Self-pay | Admitting: Family

## 2021-11-14 ENCOUNTER — Encounter: Payer: Self-pay | Admitting: Family

## 2021-11-14 MED ORDER — DOXYCYCLINE HYCLATE 100 MG PO TABS: 100.0000 mg | ORAL_TABLET | Freq: Two times a day (BID) | ORAL | 0 refills | Status: AC

## 2021-11-14 MED ORDER — PREDNISONE 20 MG PO TABS: 20.0000 mg | ORAL_TABLET | Freq: Every day | ORAL | 0 refills | Status: DC

## 2021-11-15 ENCOUNTER — Other Ambulatory Visit: Payer: Self-pay | Admitting: Cardiology

## 2021-11-15 NOTE — Telephone Encounter (Signed)
Prescription refill request for Xarelto received.  Indication:Afib Last office visit:upcoming 5/23 Weight:83.1 kg Age:81 Scr:0.8 CrCl:88.01 ml/min  Prescription refilled

## 2021-12-01 ENCOUNTER — Other Ambulatory Visit: Payer: Self-pay | Admitting: Cardiology

## 2021-12-01 DIAGNOSIS — I428 Other cardiomyopathies: Secondary | ICD-10-CM

## 2021-12-22 ENCOUNTER — Other Ambulatory Visit: Payer: Self-pay | Admitting: Cardiology

## 2021-12-24 NOTE — Telephone Encounter (Signed)
The original prescription was discontinued on 06/05/2021 by Colon Branch, MD for the following reason: Error ?

## 2021-12-25 ENCOUNTER — Encounter: Payer: Self-pay | Admitting: Cardiology

## 2021-12-25 DIAGNOSIS — I428 Other cardiomyopathies: Secondary | ICD-10-CM

## 2021-12-25 MED ORDER — LOSARTAN POTASSIUM 50 MG PO TABS: 50.0000 mg | ORAL_TABLET | Freq: Every day | ORAL | 0 refills | Status: DC

## 2022-01-02 ENCOUNTER — Other Ambulatory Visit: Payer: Self-pay | Admitting: Cardiology

## 2022-01-02 DIAGNOSIS — I428 Other cardiomyopathies: Secondary | ICD-10-CM

## 2022-02-13 NOTE — Progress Notes (Signed)
HPI: FU atrial fibrillation/atrial flutter. Echocardiogram in July of 2014 showed moderate left ventricular enlargement, mild left ventricular hypertrophy and an ejection fraction of 30-35% with diffuse hypokinesis. There was mild left atrial enlargement. TSH in July of 2014 normal. When I initially saw the patient in August of 2014 he was in atrial flutter at a rate of 150 (also with atrial fibrillation on telemetry and flutter ablation not pursued). Patient was scheduled for elective cardioversion but when he came to the procedure he was in sinus rhythm. Cardiac catheterization January 2015 showed no obstructive coronary disease and ejection fraction 35%. Patient noted to be in recurrent atrial fibrillation at last office visit.  Plan was rate control.  Echocardiogram October 2018 showed ejection fraction 45 to 50%, moderate diastolic dysfunction.  Monitor October 2018 showed patient had converted back to sinus rhythm and there were PVCs noted.  Since he was last seen, the patient has dyspnea with more extreme activities but not with routine activities. It is relieved with rest. It is not associated with chest pain. There is no orthopnea, PND or pedal edema. There is no syncope or palpitations. There is no exertional chest pain.   Current Outpatient Medications  Medication Sig Dispense Refill   b complex vitamins capsule Take 1 capsule by mouth daily.     Baclofen 5 MG TABS Take 5-10 mg by mouth 3 (three) times daily as needed. 40 tablet 0   carvedilol (COREG) 25 MG tablet TAKE 1 TABLET BY MOUTH 2 TIMES DAILY. SCHEDULE AN APPOINTMENT 90 tablet 3   Cholecalciferol (VITAMIN D3 PO) Take by mouth daily.     fluticasone (FLONASE) 50 MCG/ACT nasal spray Place 2 sprays into both nostrils daily as needed for allergies or rhinitis.     HYDROcodone bit-homatropine (HYCODAN) 5-1.5 MG/5ML syrup Take 5 mLs by mouth every 8 (eight) hours as needed for cough. 120 mL 0   loratadine (CLARITIN) 10 MG tablet  TAKE 1 TABLET (10 MG TOTAL) BY MOUTH 2 (TWO) TIMES DAILY AS NEEDED FOR ALLERGIES. 180 tablet 1   losartan (COZAAR) 50 MG tablet Take 1 tablet (50 mg total) by mouth daily. 90 tablet 0   Multiple Vitamins-Minerals (CENTRUM SILVER PO) Take 1 tablet by mouth daily.     predniSONE (DELTASONE) 20 MG tablet Take 1 tablet (20 mg total) by mouth daily with breakfast. 3 tablet 0   rivaroxaban (XARELTO) 20 MG TABS tablet TAKE 1 TABLET BY MOUTH DAILY WITH SUPPER 90 tablet 0   tamsulosin (FLOMAX) 0.4 MG CAPS capsule Take 0.4 mg by mouth as needed.  11   No current facility-administered medications for this visit.     Past Medical History:  Diagnosis Date   Abnormal EKG    hx of LBBB on ekg   Allergy    Arthritis    left hip   Atrial fibrillation (HCC)    a. Eliquis initiated 04/2013. b. on Xarelto 2020   Atrial flutter ([place])    Esophageal reflux 04/12/2013   Hypertension    Nonischemic cardiomyopathy ([place])    a. presumed to be tachy mediated;  b. 03/2013 Echo: EF 30-35%, diff HK, mild LVH, mildly dil LA.   Pericarditis 60 yrs ago age 8   Plantar wart, right foot 03/26/2017   Wears glasses     Past Surgical History:  Procedure Laterality Date   CALCANEAL OSTEOTOMY Right 1993   "stapled it; screwed it" (04/28/2013)   CALCANEUS HARDWARE REMOVAL Right ~ 1994  COLONOSCOPY     10-12 years ago; unable to find records   COLONOSCOPY  2021   CYSTOSCOPY WITH INSERTION OF UROLIFT N/A 12/19/2020   Procedure: CYSTOSCOPY WITH INSERTION OF UROLIFT;  Surgeon: Festus Aloe, MD;  Location: [facility];  Service: Urology;  Laterality: N/A;   FINGER AMPUTATION Right 1996   index (04/28/2013)   LEFT HEART CATHETERIZATION WITH CORONARY ANGIOGRAM N/A 09/30/2013   Procedure: LEFT HEART CATHETERIZATION WITH CORONARY ANGIOGRAM;  Surgeon: Burnell Blanks, MD;  Location: [facility] CATH LAB;  Service: Cardiovascular;  Laterality: N/A;   TONSILLECTOMY  1940's   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  Tobacco Use   Smoking status: Former    Packs/day: 1.00    Years: 18.00    Pack years: 18.00    Types: Cigarettes    Quit date: 09/24/1979    Years since quitting: 42.4   Smokeless tobacco: Never  Vaping Use   Vaping Use: Never used  Substance and Sexual Activity   Alcohol use: Yes    Alcohol/week: 0.0 standard drinks    Comment: rare   Drug use: No   Sexual activity: Yes    Partners: Female  Other Topics Concern   Not on file  Social History Narrative   Not on file   Social Determinants of Health   Financial Resource Strain: Not on file  Food Insecurity: Not on file  Transportation Needs: Not on file  Physical Activity: Not on file  Stress: Not on file  Social Connections: Not on file  Intimate Partner Violence: Not on file    Family History  Problem Relation Age of Onset   Stroke Father    Asthma Mother        low back pain   Colon cancer Neg Hx    Esophageal cancer Neg Hx    Rectal cancer Neg Hx    Stomach cancer Neg Hx     ROS: no fevers or chills, productive cough, hemoptysis, dysphasia, odynophagia, melena, hematochezia, dysuria, hematuria, rash, seizure activity, orthopnea, PND, pedal edema, claudication. Remaining systems are negative.  Physical Exam: Well-developed well-nourished in no acute distress.  Skin is warm and dry.  HEENT is normal.  Neck is supple.  Chest is clear to auscultation with normal expansion.  Cardiovascular exam is irregular Abdominal exam nontender or distended. No masses palpated. Extremities show no edema. neuro  grossly intact  ECG-atrial fibrillation at a rate of 69, left bundle branch block.  Personally reviewed  A/P  1 permanent atrial fibrillation-plan to continue carvedilol for rate control.  Continue Xarelto.  Check hemoglobin and renal function.  2 history of atrial flutter-as per #1.  3 nonischemic cardiomyopathy-continue ARB and beta-blocker. Repeat echo to reassess LV function.    Kirk Ruths, MD

## 2022-02-15 ENCOUNTER — Other Ambulatory Visit: Payer: Self-pay | Admitting: Cardiology

## 2022-02-15 DIAGNOSIS — I480 Paroxysmal atrial fibrillation: Secondary | ICD-10-CM

## 2022-02-19 NOTE — Telephone Encounter (Signed)
Prescription refill request for Xarelto received.  Indication: a fib Last office visit: overdue, has appt tomorrow Weight: 183 Age: 81  Scr: 0.89 CrCl:  79 mL/min

## 2022-02-20 ENCOUNTER — Encounter: Payer: Self-pay | Admitting: Cardiology

## 2022-02-20 ENCOUNTER — Ambulatory Visit: Payer: Medicare HMO | Admitting: Cardiology

## 2022-02-20 VITALS — BP 126/72 | HR 69 | Ht 70.0 in | Wt 186.0 lb

## 2022-02-20 DIAGNOSIS — I428 Other cardiomyopathies: Secondary | ICD-10-CM

## 2022-02-20 DIAGNOSIS — I482 Chronic atrial fibrillation, unspecified: Secondary | ICD-10-CM | POA: Diagnosis not present

## 2022-02-20 MED ORDER — LOSARTAN POTASSIUM 50 MG PO TABS: 50.0000 mg | ORAL_TABLET | Freq: Every day | ORAL | 3 refills | Status: DC

## 2022-02-20 NOTE — Patient Instructions (Signed)
  Testing/Procedures:  Your physician has requested that you have an echocardiogram. Echocardiography is a painless test that uses sound waves to create images of your heart. It provides your doctor with information about the size and shape of your heart and how well your heart's chambers and valves are working. This procedure takes approximately one hour. There are no restrictions for this procedure. [place] [facility]-1 ST FLOOR IMAGING DEPARTMENT   Follow-Up: At [facility], you and your health needs are our priority.  As part of our continuing mission to provide you with exceptional heart care, we have created designated Provider Care Teams.  These Care Teams include your primary Cardiologist (physician) and Advanced Practice Providers (APPs -  Physician Assistants and Nurse Practitioners) who all work together to provide you with the care you need, when you need it.  We recommend signing up for the patient portal called "MyChart".  Sign up information is provided on this After Visit Summary.  MyChart is used to connect with patients for Virtual Visits (Telemedicine).  Patients are able to view lab/test results, encounter notes, upcoming appointments, etc.  Non-urgent messages can be sent to your provider as well.   To learn more about what you can do with MyChart, go to [email or web address].    Your next appointment:   12 month(s)  The format for your next appointment:   In Person  Provider:   Brian Crenshaw, MD      Important Information About Sugar       

## 2022-02-20 NOTE — Telephone Encounter (Signed)
Spoke with pt regarding Losartan dosage, no changes made recently to pt's medication dosage. Advised pt he should continue to be on the same dosage, he will take two '25mg'$  tablets to make '50mg'$  total. Pt will make sure they give him '50mg'$  tablets at the next refill. Pt verbalizes understanding. Added more refills to pt's prescription.

## 2022-02-21 LAB — BASIC METABOLIC PANEL
BUN/Creatinine Ratio: 14 (ref 10–24)
BUN: 13 mg/dL (ref 8–27)
CO2: 24 mmol/L (ref 20–29)
Calcium: 9.8 mg/dL (ref 8.6–10.2)
Chloride: 104 mmol/L (ref 96–106)
Creatinine, Ser: 0.96 mg/dL (ref 0.76–1.27)
Glucose: 87 mg/dL (ref 70–99)
Potassium: 4.6 mmol/L (ref 3.5–5.2)
Sodium: 141 mmol/L (ref 134–144)
eGFR: 80 mL/min/{1.73_m2} (ref >59)

## 2022-02-21 LAB — CBC
Hematocrit: 45.5 % (ref 37.5–51.0)
Hemoglobin: 15.7 g/dL (ref 13.0–17.7)
MCH: 30.4 pg (ref 26.6–33.0)
MCHC: 34.5 g/dL (ref 31.5–35.7)
MCV: 88 fL (ref 79–97)
Platelets: 183 10*3/uL (ref 150–450)
RBC: 5.17 x10E6/uL (ref 4.14–5.80)
RDW: 12.1 % (ref 11.6–15.4)
WBC: 7.2 10*3/uL (ref 3.4–10.8)

## 2022-02-26 ENCOUNTER — Encounter: Payer: Self-pay | Admitting: *Deleted

## 2022-03-12 ENCOUNTER — Ambulatory Visit (HOSPITAL_BASED_OUTPATIENT_CLINIC_OR_DEPARTMENT_OTHER)
Admission: RE | Admit: 2022-03-12 | Discharge: 2022-03-12 | Disposition: A | Discharge status: Home / Self Care | Payer: Medicare HMO | Source: Ambulatory Visit | Attending: Cardiology | Admitting: Cardiology

## 2022-03-12 DIAGNOSIS — I428 Other cardiomyopathies: Secondary | ICD-10-CM | POA: Insufficient documentation

## 2022-03-12 DIAGNOSIS — I482 Chronic atrial fibrillation, unspecified: Secondary | ICD-10-CM | POA: Insufficient documentation

## 2022-03-12 LAB — ECHOCARDIOGRAM COMPLETE
AR max vel: 2.28 cm2
AV Area VTI: 2.49 cm2
AV Area mean vel: 2.19 cm2
AV Mean grad: 3 mmHg
AV Peak grad: 5.4 mmHg
Ao pk vel: 1.16 m/s
Calc EF: 47 %
MV M vel: 4.88 m/s
MV Peak grad: 95.3 mmHg
Radius: 0.8 cm
S' Lateral: 3.5 cm
Single Plane A2C EF: 46 %
Single Plane A4C EF: 48.1 %

## 2022-03-12 NOTE — Progress Notes (Signed)
  Echocardiogram 2D Echocardiogram has been performed.  Merrie Roof F 03/12/2022, 12:52 PM

## 2022-04-05 DIAGNOSIS — Z008 Encounter for other general examination: Secondary | ICD-10-CM | POA: Diagnosis not present

## 2022-04-05 DIAGNOSIS — Z8249 Family history of ischemic heart disease and other diseases of the circulatory system: Secondary | ICD-10-CM | POA: Diagnosis not present

## 2022-04-05 DIAGNOSIS — I2510 Atherosclerotic heart disease of native coronary artery without angina pectoris: Secondary | ICD-10-CM | POA: Diagnosis not present

## 2022-04-05 DIAGNOSIS — I4891 Unspecified atrial fibrillation: Secondary | ICD-10-CM | POA: Diagnosis not present

## 2022-04-05 DIAGNOSIS — E785 Hyperlipidemia, unspecified: Secondary | ICD-10-CM | POA: Diagnosis not present

## 2022-04-05 DIAGNOSIS — D6869 Other thrombophilia: Secondary | ICD-10-CM | POA: Diagnosis not present

## 2022-04-05 DIAGNOSIS — I4892 Unspecified atrial flutter: Secondary | ICD-10-CM | POA: Diagnosis not present

## 2022-04-05 DIAGNOSIS — Z791 Long term (current) use of non-steroidal anti-inflammatories (NSAID): Secondary | ICD-10-CM | POA: Diagnosis not present

## 2022-04-05 DIAGNOSIS — I10 Essential (primary) hypertension: Secondary | ICD-10-CM | POA: Diagnosis not present

## 2022-04-05 DIAGNOSIS — Z7901 Long term (current) use of anticoagulants: Secondary | ICD-10-CM | POA: Diagnosis not present

## 2022-04-05 DIAGNOSIS — Z87891 Personal history of nicotine dependence: Secondary | ICD-10-CM | POA: Diagnosis not present

## 2022-04-05 DIAGNOSIS — Z89021 Acquired absence of right finger(s): Secondary | ICD-10-CM | POA: Diagnosis not present

## 2022-04-05 DIAGNOSIS — M1990 Unspecified osteoarthritis, unspecified site: Secondary | ICD-10-CM | POA: Diagnosis not present

## 2022-04-22 DIAGNOSIS — N200 Calculus of kidney: Secondary | ICD-10-CM | POA: Diagnosis not present

## 2022-04-22 DIAGNOSIS — R310 Gross hematuria: Secondary | ICD-10-CM | POA: Diagnosis not present

## 2022-04-23 DIAGNOSIS — L538 Other specified erythematous conditions: Secondary | ICD-10-CM | POA: Diagnosis not present

## 2022-04-23 DIAGNOSIS — Z789 Other specified health status: Secondary | ICD-10-CM | POA: Diagnosis not present

## 2022-04-23 DIAGNOSIS — L570 Actinic keratosis: Secondary | ICD-10-CM | POA: Diagnosis not present

## 2022-04-23 DIAGNOSIS — L814 Other melanin hyperpigmentation: Secondary | ICD-10-CM | POA: Diagnosis not present

## 2022-04-23 DIAGNOSIS — D1801 Hemangioma of skin and subcutaneous tissue: Secondary | ICD-10-CM | POA: Diagnosis not present

## 2022-04-23 DIAGNOSIS — L821 Other seborrheic keratosis: Secondary | ICD-10-CM | POA: Diagnosis not present

## 2022-04-23 DIAGNOSIS — R208 Other disturbances of skin sensation: Secondary | ICD-10-CM | POA: Diagnosis not present

## 2022-04-23 DIAGNOSIS — L298 Other pruritus: Secondary | ICD-10-CM | POA: Diagnosis not present

## 2022-04-23 DIAGNOSIS — L820 Inflamed seborrheic keratosis: Secondary | ICD-10-CM | POA: Diagnosis not present

## 2022-04-26 DIAGNOSIS — H02105 Unspecified ectropion of left lower eyelid: Secondary | ICD-10-CM | POA: Diagnosis not present

## 2022-04-26 DIAGNOSIS — H02102 Unspecified ectropion of right lower eyelid: Secondary | ICD-10-CM | POA: Diagnosis not present

## 2022-04-26 DIAGNOSIS — H2513 Age-related nuclear cataract, bilateral: Secondary | ICD-10-CM | POA: Diagnosis not present

## 2022-04-26 DIAGNOSIS — H5203 Hypermetropia, bilateral: Secondary | ICD-10-CM | POA: Diagnosis not present

## 2022-05-06 ENCOUNTER — Other Ambulatory Visit: Payer: Self-pay | Admitting: Cardiology

## 2022-05-16 DIAGNOSIS — N202 Calculus of kidney with calculus of ureter: Secondary | ICD-10-CM | POA: Diagnosis not present

## 2022-05-16 DIAGNOSIS — N201 Calculus of ureter: Secondary | ICD-10-CM | POA: Diagnosis not present

## 2022-05-16 DIAGNOSIS — R310 Gross hematuria: Secondary | ICD-10-CM | POA: Diagnosis not present

## 2022-05-27 ENCOUNTER — Other Ambulatory Visit: Payer: Self-pay | Admitting: Cardiology

## 2022-05-27 DIAGNOSIS — I428 Other cardiomyopathies: Secondary | ICD-10-CM

## 2022-05-27 DIAGNOSIS — I480 Paroxysmal atrial fibrillation: Secondary | ICD-10-CM

## 2022-05-29 DIAGNOSIS — K5730 Diverticulosis of large intestine without perforation or abscess without bleeding: Secondary | ICD-10-CM | POA: Diagnosis not present

## 2022-05-29 DIAGNOSIS — N201 Calculus of ureter: Secondary | ICD-10-CM | POA: Diagnosis not present

## 2022-05-29 DIAGNOSIS — R3129 Other microscopic hematuria: Secondary | ICD-10-CM | POA: Diagnosis not present

## 2022-05-29 DIAGNOSIS — N200 Calculus of kidney: Secondary | ICD-10-CM | POA: Diagnosis not present

## 2022-05-29 DIAGNOSIS — R310 Gross hematuria: Secondary | ICD-10-CM | POA: Diagnosis not present

## 2022-05-30 ENCOUNTER — Other Ambulatory Visit: Payer: Self-pay | Admitting: Cardiology

## 2022-05-30 DIAGNOSIS — I480 Paroxysmal atrial fibrillation: Secondary | ICD-10-CM

## 2022-05-31 NOTE — Telephone Encounter (Signed)
Prescription refill request for Xarelto received.  Indication: Afib  Last office visit: 02/20/22 Stanford Breed)  Weight: 84.4kg Age: 81 Scr: 0.96 (02/20/22) CrCl: 74.36m/min  Appropriate dose and refill sent to requested pharmacy.

## 2022-06-04 ENCOUNTER — Other Ambulatory Visit: Payer: Self-pay | Admitting: Urology

## 2022-06-04 ENCOUNTER — Telehealth: Payer: Self-pay | Admitting: Cardiology

## 2022-06-04 NOTE — Telephone Encounter (Signed)
   Pre-operative Risk Assessment    Patient Name: Francis Shaw  DOB: October 24, 1940 MRN: [id number]      Request for Surgical Clearance    Procedure:   Lithotrypsy for kidney stones  Date of Surgery:  Clearance 06/10/22                                 Surgeon:  Dr. [name] Surgeon's Group or Practice Name:  [facility] Phone number:  [phone] Fax number:  [phone]   Type of Clearance Requested:   - Medical  - Pharmacy:  Hold Xarelto      Type of Anesthesia:  Local    Additional requests/questions:   Caller stated they will need medical and pharmacy clearance.  Signed, Heloise Beecham   06/04/2022, 3:00 PM

## 2022-06-05 ENCOUNTER — Telehealth: Payer: Self-pay | Admitting: *Deleted

## 2022-06-05 NOTE — Telephone Encounter (Signed)
Pt agreeable to plan of care for tele pre op appt add on per procedure date 9/18. Pt states he was told by surgeon office to hold Eliquis as of 06/06/22. I did tell the pt that pharm-d and cardiologist recommend 2 days hld time, this would mean that he will take his last dose on 06/07/22 and hold 9/16, 9/17 and procedure on 06/10/22. Pt will resume once felt surgeon feels safe from bleeding standpoint. Pt thanked me for the help and he will follow our directions for his blood thinner.   Med rec and consent are done.     Patient Consent for Virtual Visit        Gianfranco Araki Bebeau has provided verbal consent on 06/05/2022 for a virtual visit (video or telephone).   CONSENT FOR VIRTUAL VISIT FOR:  Early Chars Edberg  By participating in this virtual visit I agree to the following:  I hereby voluntarily request, consent and authorize [facility] and its employed or contracted physicians, physician assistants, nurse practitioners or other licensed health care professionals (the Practitioner), to provide me with telemedicine health care services (the "Services") as deemed necessary by the treating Practitioner. I acknowledge and consent to receive the Services by the Practitioner via telemedicine. I understand that the telemedicine visit will involve communicating with the Practitioner through live audiovisual communication technology and the disclosure of certain medical information by electronic transmission. I acknowledge that I have been given the opportunity to request an in-person assessment or other available alternative prior to the telemedicine visit and am voluntarily participating in the telemedicine visit.  I understand that I have the right to withhold or withdraw my consent to the use of telemedicine in the course of my care at any time, without affecting my right to future care or treatment, and that the Practitioner or I may terminate the telemedicine visit at any time. I understand that I  have the right to inspect all information obtained and/or recorded in the course of the telemedicine visit and may receive copies of available information for a reasonable fee.  I understand that some of the potential risks of receiving the Services via telemedicine include:  Delay or interruption in medical evaluation due to technological equipment failure or disruption; Information transmitted may not be sufficient (e.g. poor resolution of images) to allow for appropriate medical decision making by the Practitioner; and/or  In rare instances, security protocols could fail, causing a breach of personal health information.  Furthermore, I acknowledge that it is my responsibility to provide information about my medical history, conditions and care that is complete and accurate to the best of my ability. I acknowledge that Practitioner's advice, recommendations, and/or decision may be based on factors not within their control, such as incomplete or inaccurate data provided by me or distortions of diagnostic images or specimens that may result from electronic transmissions. I understand that the practice of medicine is not an exact science and that Practitioner makes no warranties or guarantees regarding treatment outcomes. I acknowledge that a copy of this consent can be made available to me via my patient portal ([facility]), or I can request a printed copy by calling the office of [facility].    I understand that my insurance will be billed for this visit.   I have read or had this consent read to me. I understand the contents of this consent, which adequately explains the benefits and risks of the Services being provided via telemedicine.  I  have been provided ample opportunity to ask questions regarding this consent and the Services and have had my questions answered to my satisfaction. I give my informed consent for the services to be provided through the use of telemedicine in my  medical care

## 2022-06-05 NOTE — Telephone Encounter (Signed)
   Name: Francis Shaw  DOB: 05-05-1941  MRN: [id number]  Primary Cardiologist: Kirk Ruths, MD   Preoperative team, please contact this patient and set up a phone call appointment for further preoperative risk assessment. Please obtain consent and complete medication review. Thank you for your help.  I confirm that guidance regarding antiplatelet and oral anticoagulation therapy has been completed and, if necessary, noted below.  Patient with diagnosis of afib on Xarelto for anticoagulation.     Procedure:   Lithotrypsy for kidney stones Date of procedure: 06/10/22     CHA2DS2-VASc Score = 4   This indicates a 4.8% annual risk of stroke. The patient's score is based upon: CHF History: 1 HTN History: 1 Diabetes History: 0 Stroke History: 0 Vascular Disease History: 0 Age Score: 2 Gender Score: 0       CrCl 64 ml/min   Per office protocol, patient can hold Xarelto for 2 days prior to procedure.    Lenna Sciara, NP 06/05/2022, 1:02 PM [facility]

## 2022-06-05 NOTE — Telephone Encounter (Signed)
Pt agreeable to plan of care for tele pre op appt add on per procedure date 9/18. Pt states he was told by surgeon office to hold Eliquis as of 06/06/22. I did tell the pt that pharm-d and cardiologist recommend 2 days hld time, this would mean that he will take his last dose on 06/07/22 and hold 9/16, 9/17 and procedure on 06/10/22. Pt will resume once felt surgeon feels safe from bleeding standpoint. Pt thanked me for the help and he will follow our directions for his blood thinner.    Med rec and consent are done.

## 2022-06-05 NOTE — Telephone Encounter (Signed)
Patient with diagnosis of afib on Xarelto for anticoagulation.    Procedure:   Lithotrypsy for kidney stones Date of procedure: 06/10/22   CHA2DS2-VASc Score = 4   This indicates a 4.8% annual risk of stroke. The patient's score is based upon: CHF History: 1 HTN History: 1 Diabetes History: 0 Stroke History: 0 Vascular Disease History: 0 Age Score: 2 Gender Score: 0      CrCl 64 ml/min  Per office protocol, patient can hold Xarelto for 2 days prior to procedure.    **This guidance is not considered finalized until pre-operative APP has relayed final recommendations.**

## 2022-06-05 NOTE — Progress Notes (Signed)
Virtual Visit via Telephone Note   Because of Francis Shaw's co-morbid illnesses, he is at least at moderate risk for complications without adequate follow up.  This format is felt to be most appropriate for this patient at this time.  The patient did not have access to video technology/had technical difficulties with video requiring transitioning to audio format only (telephone).  All issues noted in this document were discussed and addressed.  No physical exam could be performed with this format.  Please refer to the patient's chart for his consent to telehealth for [facility].  Evaluation Performed:  Preoperative cardiovascular risk assessment _____________   Date:  06/05/2022   Patient ID:  VI WHITESEL, DOB 06-04-1941, MRN [id number] Patient Location:  Home Provider location:   Office  Primary Care Provider:  Mosie Lukes, MD Primary Cardiologist:  Kirk Ruths, MD  Chief Complaint / Patient Profile   81 y.o. y/o male with a h/o atrial fibrillation/atrial flutter on chronic anticoagulation, LVH, chronic HFrEF,  moderate MR, mildly elevated pulmonary artery systolic pressure, moderate TR, who is pending lithotripsy for kidney stones and presents today for telephonic preoperative cardiovascular risk assessment.  Past Medical History    Past Medical History:  Diagnosis Date   Abnormal EKG    hx of LBBB on ekg   Allergy    Arthritis    left hip   Atrial fibrillation ([place])    a. Eliquis initiated 04/2013. b. on Xarelto 2020   Atrial flutter ([place])    Esophageal reflux 04/12/2013   Hypertension    Nonischemic cardiomyopathy ([place])    a. presumed to be tachy mediated;  b. 03/2013 Echo: EF 30-35%, diff HK, mild LVH, mildly dil LA.   Pericarditis 60 yrs ago age 55   Plantar wart, right foot 03/26/2017   Wears glasses    Past Surgical History:  Procedure Laterality Date   CALCANEAL OSTEOTOMY Right 1993   "stapled it; screwed it" (04/28/2013)   CALCANEUS HARDWARE  REMOVAL Right ~ 1994   COLONOSCOPY     10-12 years ago; unable to find records   COLONOSCOPY  2021   CYSTOSCOPY WITH INSERTION OF UROLIFT N/A 12/19/2020   Procedure: CYSTOSCOPY WITH INSERTION OF UROLIFT;  Surgeon: Festus Aloe, MD;  Location: [facility];  Service: Urology;  Laterality: N/A;   FINGER AMPUTATION Right 1996   index (04/28/2013)   LEFT HEART CATHETERIZATION WITH CORONARY ANGIOGRAM N/A 09/30/2013   Procedure: LEFT HEART CATHETERIZATION WITH CORONARY ANGIOGRAM;  Surgeon: Burnell Blanks, MD;  Location: [facility] CATH LAB;  Service: Cardiovascular;  Laterality: N/A;   TONSILLECTOMY  1940's   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 male who presents via audio/video conferencing for a telehealth visit today.  Pt was last seen in cardiology clinic on 02/20/22 by Dr. Stanford Breed.  At that time Alexandre Faries Valko was doing well.  The patient is now pending procedure as outlined above. Since his last visit, he  denies chest pain, shortness of breath, lower extremity edema, fatigue, palpitations, melena, hematuria, hemoptysis, diaphoresis, weakness, presyncope, syncope, orthopnea, and PND.  Home Medications    Prior to Admission medications   Medication Sig Start Date End Date Taking? Authorizing Provider  b complex  vitamins capsule Take 1 capsule by mouth daily.    [provider]  Baclofen 5 MG TABS Take 5-10 mg by mouth 3 (three) times daily as needed. 05/15/21   Colon Branch, MD  carvedilol (COREG) 25 MG tablet TAKE 1 TABLET BY MOUTH 2 TIMES DAILY. SCHEDULE AN APPOINTMENT 05/31/22   Lelon Perla, MD  Cholecalciferol (VITAMIN D3 PO) Take by mouth daily.    [provider]  fluticasone (FLONASE) 50 MCG/ACT nasal spray Place 2  sprays into both nostrils daily as needed for allergies or rhinitis.    [provider]  HYDROcodone bit-homatropine (HYCODAN) 5-1.5 MG/5ML syrup Take 5 mLs by mouth every 8 (eight) hours as needed for cough. Patient not taking: Reported on 06/05/2022 11/09/21   Marrian Salvage, FNP  loratadine (CLARITIN) 10 MG tablet TAKE 1 TABLET (10 MG TOTAL) BY MOUTH 2 (TWO) TIMES DAILY AS NEEDED FOR ALLERGIES. 03/13/20   Mosie Lukes, MD  losartan (COZAAR) 50 MG tablet Take 1 tablet (50 mg total) by mouth daily. 02/20/22   Lelon Perla, MD  Multiple Vitamins-Minerals (CENTRUM SILVER PO) Take 1 tablet by mouth daily.    [provider]  predniSONE (DELTASONE) 20 MG tablet Take 1 tablet (20 mg total) by mouth daily with breakfast. Patient not taking: Reported on 06/05/2022 11/14/21   Marrian Salvage, FNP  rivaroxaban (XARELTO) 20 MG TABS tablet TAKE 1 TABLET BY MOUTH DAILY WITH SUPPER 05/31/22   Lelon Perla, MD  tamsulosin (FLOMAX) 0.4 MG CAPS capsule Take 0.4 mg by mouth as needed. 05/16/18   [provider]    Physical Exam    Vital Signs:  Early Chars Besecker does not have vital signs available for review today.  Given telephonic nature of communication, physical exam is limited. AAOx3. NAD. Normal affect.  Speech and respirations are unlabored.  Accessory Clinical Findings    None  Assessment & Plan    1.  Preoperative Cardiovascular Risk Assessment:The patient is doing well from a cardiac perspective. Therefore, based on ACC/AHA guidelines, the patient would be at acceptable risk for the planned procedure without further cardiovascular testing. The patient was advised that if he develops new symptoms prior to surgery to contact our office to arrange for a follow-up visit, and he verbalized understanding.   The patient was advised that if he develops new symptoms prior to surgery to contact our office to arrange for a follow-up visit, and he verbalized  understanding.  Per office protocol, patient can hold Xarelto for 2 days prior to procedure.    A copy of this note will be routed to requesting surgeon.  Time:   Today, I have spent 7 minutes with the patient with telehealth technology discussing medical history, symptoms, and management plan.     Emmaline Life, NP-C  06/07/2022, 3:16 PM [address] Office [phone] Fax [phone]

## 2022-06-06 NOTE — Progress Notes (Signed)
Talked with patient. Instructions given. Meds and hx reviewed. Per  his cardiologist to stop Xarelto 2 days instead of 3 days prior to procedure. Will stop Friday night at 7 pm. Arrival time 0915 clear liquids until 0715. Wife is the driver

## 2022-06-07 ENCOUNTER — Ambulatory Visit: Payer: Medicare HMO | Attending: Cardiovascular Disease | Admitting: Nurse Practitioner

## 2022-06-07 ENCOUNTER — Encounter: Payer: Self-pay | Admitting: Nurse Practitioner

## 2022-06-07 DIAGNOSIS — Z01810 Encounter for preprocedural cardiovascular examination: Secondary | ICD-10-CM

## 2022-06-10 ENCOUNTER — Ambulatory Visit (HOSPITAL_BASED_OUTPATIENT_CLINIC_OR_DEPARTMENT_OTHER)
Admission: RE | Admit: 2022-06-10 | Discharge: 2022-06-10 | Disposition: A | Discharge status: Home / Self Care | Payer: Medicare HMO | Attending: Urology | Admitting: Urology

## 2022-06-10 ENCOUNTER — Encounter (HOSPITAL_BASED_OUTPATIENT_CLINIC_OR_DEPARTMENT_OTHER): Payer: Self-pay | Admitting: Urology

## 2022-06-10 ENCOUNTER — Ambulatory Visit (HOSPITAL_COMMUNITY): Payer: Medicare HMO

## 2022-06-10 ENCOUNTER — Encounter (HOSPITAL_BASED_OUTPATIENT_CLINIC_OR_DEPARTMENT_OTHER)
Admission: RE | Disposition: A | Discharge status: Home / Self Care | Payer: Self-pay | Source: Home / Self Care | Attending: Urology

## 2022-06-10 ENCOUNTER — Other Ambulatory Visit: Payer: Self-pay

## 2022-06-10 DIAGNOSIS — I10 Essential (primary) hypertension: Secondary | ICD-10-CM | POA: Insufficient documentation

## 2022-06-10 DIAGNOSIS — I4891 Unspecified atrial fibrillation: Secondary | ICD-10-CM | POA: Diagnosis not present

## 2022-06-10 DIAGNOSIS — Z01818 Encounter for other preprocedural examination: Secondary | ICD-10-CM

## 2022-06-10 DIAGNOSIS — Z7901 Long term (current) use of anticoagulants: Secondary | ICD-10-CM | POA: Diagnosis not present

## 2022-06-10 DIAGNOSIS — N201 Calculus of ureter: Secondary | ICD-10-CM | POA: Insufficient documentation

## 2022-06-10 DIAGNOSIS — R319 Hematuria, unspecified: Secondary | ICD-10-CM | POA: Insufficient documentation

## 2022-06-10 DIAGNOSIS — N200 Calculus of kidney: Secondary | ICD-10-CM | POA: Diagnosis not present

## 2022-06-10 DIAGNOSIS — Z96642 Presence of left artificial hip joint: Secondary | ICD-10-CM | POA: Diagnosis not present

## 2022-06-10 HISTORY — PX: EXTRACORPOREAL SHOCK WAVE LITHOTRIPSY: SHX1557

## 2022-06-10 LAB — APTT: aPTT: 33 seconds (ref 24–36)

## 2022-06-10 SURGERY — LITHOTRIPSY, ESWL
Anesthesia: LOCAL | Laterality: Left

## 2022-06-10 MED ORDER — CIPROFLOXACIN HCL 500 MG PO TABS
ORAL_TABLET | ORAL | Status: AC
  Filled 2022-06-10: qty 1

## 2022-06-10 MED ORDER — DIAZEPAM 5 MG PO TABS
ORAL_TABLET | ORAL | Status: AC
  Filled 2022-06-10: qty 2

## 2022-06-10 MED ORDER — DIAZEPAM 5 MG PO TABS
10.0000 mg | ORAL_TABLET | ORAL | Status: AC
  Administered 2022-06-10: 10 mg via ORAL

## 2022-06-10 MED ORDER — CIPROFLOXACIN HCL 500 MG PO TABS
500.0000 mg | ORAL_TABLET | ORAL | Status: AC
  Administered 2022-06-10: 500 mg via ORAL

## 2022-06-10 MED ORDER — DIPHENHYDRAMINE HCL 25 MG PO CAPS
25.0000 mg | ORAL_CAPSULE | ORAL | Status: AC
  Administered 2022-06-10: 25 mg via ORAL

## 2022-06-10 MED ORDER — SODIUM CHLORIDE 0.9 % IV SOLN: INTRAVENOUS | Status: DC

## 2022-06-10 MED ORDER — DIPHENHYDRAMINE HCL 25 MG PO CAPS
ORAL_CAPSULE | ORAL | Status: AC
  Filled 2022-06-10: qty 1

## 2022-06-10 MED ORDER — SENNA 8.6 MG PO TABS: 1.0000 | ORAL_TABLET | Freq: Two times a day (BID) | ORAL | 0 refills | Status: DC

## 2022-06-10 MED ORDER — ONDANSETRON HCL 8 MG PO TABS: 8.0000 mg | ORAL_TABLET | Freq: Three times a day (TID) | ORAL | 0 refills | Status: DC | PRN

## 2022-06-10 MED ORDER — OXYCODONE-ACETAMINOPHEN 5-325 MG PO TABS: 1.0000 | ORAL_TABLET | Freq: Four times a day (QID) | ORAL | 0 refills | Status: AC | PRN

## 2022-06-10 NOTE — H&P (Signed)
Francis Shaw is an 81 y.o. male.    Chief Complaint: Pre-Op LEFT Shockwave LIthotripsy  HPI:   1 - LEFT Proximal Ureteral Stone -36m left prox ureteral stone by KUB and CT 05/2022. Just lateral to L4 transversre process, no passage with medical therapy. Few scattered non-obstructing left renal stones as well.  Today "Francis Shaw is seen to proceed with LEFT shockwave lithotripsy. He has held Xarelto as instructed. Most recent UCX negative. PTT from today 33 (normal).   Past Medical History:  Diagnosis Date   Abnormal EKG    hx of LBBB on ekg   Allergy    Arthritis    left hip   Atrial fibrillation ([facility])    a. Eliquis initiated 04/2013. b. on Xarelto 2020   [place] ([place]    Esophageal reflux 04/12/2013   Hypertension    [place]ischemic cardiomyopathy [place]    a. presumed to be tachy mediated;  b. 03/2013 Echo: EF 30-35%, diff HK, mild LVH, mildly dil LA.   Pericarditis 60 yrs ago age 81  Plantar wart, right foot 03/26/2017   Wears glasses     Past Surgical History:  Procedure Laterality Date   CALCANEAL OSTEOTOMY Right 1993   "stapled it; screwed it" (04/28/2013)   CALCANEUS HARDWARE REMOVAL Right ~ 1994   COLONOSCOPY     10-12 years ago; unable to find records   COLONOSCOPY  2021   CYSTOSCOPY WITH INSERTION OF UROLIFT N/A 12/19/2020   Procedure: CYSTOSCOPY WITH INSERTION OF UROLIFT;  Surgeon: EFestus Aloe MD;  [facility]  Service: Urology;  Laterality: N/A;   FINGER AMPUTATION Right 1996   index (04/28/2013)   LEFT HEART CATHETERIZATION WITH CORONARY ANGIOGRAM N/A 09/30/2013   Procedure: LEFT HEART CATHETERIZATION WITH CORONARY ANGIOGRAM;  Surgeon: CBurnell Blanks MD;  [facility];  Service: Cardiovascular;  Laterality: N/A;   TONSILLECTOMY  1940's   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 His smoking use included cigarettes. He has a 18.00 pack-year smoking history. He has never used smokeless tobacco. He reports current alcohol use. He reports that he does not use drugs.  Allergies: No Known Allergies  No medications prior to admission.    No results found for this or any previous visit (from the past 48 hour(s)). No results found.  Review of Systems  Constitutional:  Negative for chills and fever.  Genitourinary:  Positive for flank pain.  All other systems reviewed and are negative.   There were no vitals taken for this visit. Physical Exam Vitals reviewed.  HENT:     Head: Normocephalic.     Mouth/Throat:     Mouth: Mucous membranes are moist.  Eyes:     Pupils: Pupils are equal, round, and reactive to light.  Abdominal:     General: Abdomen is flat.  Musculoskeletal:     Comments: Minimal left CVAT at present  Skin:    General: Skin is  warm.  Neurological:     General: No focal deficit present.      Assessment/Plan  Proceed as planned with LEFT shockwave lithotrispy. Risks, benefits, alternatives, expected peri-op course discussed previously and reiterated today.   Francis Frock, MD 06/10/2022, 7:08 AM

## 2022-06-10 NOTE — Discharge Instructions (Addendum)
1 - You may have urinary urgency (bladder spasms), pass small stone fragments, and bloody urine on / off for up to 2 weeks. This is normal.  2 - Resume Xarelto on Wednesday 06/12/2022.    3 - Call MD or go to ER for fever >102, severe pain / nausea / vomiting not relieved by medications, or acute change in medical status   Post Anesthesia Home Care Instructions  Activity: Get plenty of rest for the remainder of the day. A responsible individual must stay with you for 24 hours following the procedure.  For the next 24 hours, DO NOT: -Drive a car -Paediatric nurse -Drink alcoholic beverages -Take any medication unless instructed by your physician -Make any legal decisions or sign important papers.  Meals: Start with liquid foods such as gelatin or soup. Progress to regular foods as tolerated. Avoid greasy, spicy, heavy foods. If nausea and/or vomiting occur, drink only clear liquids until the nausea and/or vomiting subsides. Call your physician if vomiting continues.

## 2022-06-10 NOTE — Brief Op Note (Signed)
06/10/2022  12:23 PM  PATIENT:  Francis Shaw  81 y.o. male  PRE-OPERATIVE DIAGNOSIS:  LEFT URETERAL PELVIC JUNCTION STONE  POST-OPERATIVE DIAGNOSIS:  * No post-op diagnosis entered *  PROCEDURE:  Procedure(s): LEFT EXTRACORPOREAL SHOCK WAVE LITHOTRIPSY (ESWL) (Left)  SURGEON:  Surgeon(s) and Role:    * [name], MD - Primary  PHYSICIAN ASSISTANT:   ASSISTANTS: none   ANESTHESIA:   MAC  EBL:  minimal   BLOOD ADMINISTERED:none  DRAINS: none   LOCAL MEDICATIONS USED:  NONE  SPECIMEN:  No Specimen  DISPOSITION OF SPECIMEN:  N/A  COUNTS:  YES  TOURNIQUET:  * No tourniquets in log *  DICTATION: .Note written in paper chart  PLAN OF CARE: Discharge to home after PACU  PATIENT DISPOSITION:  Short Stay   Delay start of Pharmacological VTE agent (>24hrs) due to surgical blood loss or risk of bleeding: yes

## 2022-06-11 ENCOUNTER — Encounter (HOSPITAL_BASED_OUTPATIENT_CLINIC_OR_DEPARTMENT_OTHER): Payer: Self-pay | Admitting: Urology

## 2022-07-01 DIAGNOSIS — N401 Enlarged prostate with lower urinary tract symptoms: Secondary | ICD-10-CM | POA: Diagnosis not present

## 2022-07-01 DIAGNOSIS — N200 Calculus of kidney: Secondary | ICD-10-CM | POA: Diagnosis not present

## 2022-08-27 DIAGNOSIS — M19011 Primary osteoarthritis, right shoulder: Secondary | ICD-10-CM | POA: Diagnosis not present

## 2022-08-27 DIAGNOSIS — M67911 Unspecified disorder of synovium and tendon, right shoulder: Secondary | ICD-10-CM | POA: Diagnosis not present

## 2022-08-27 DIAGNOSIS — M545 Low back pain, unspecified: Secondary | ICD-10-CM | POA: Diagnosis not present

## 2022-10-07 ENCOUNTER — Telehealth: Payer: Self-pay | Admitting: Family Medicine

## 2022-10-07 ENCOUNTER — Ambulatory Visit (INDEPENDENT_AMBULATORY_CARE_PROVIDER_SITE_OTHER): Payer: Medicare HMO | Admitting: Family Medicine

## 2022-10-07 ENCOUNTER — Encounter: Payer: Self-pay | Admitting: Family Medicine

## 2022-10-07 ENCOUNTER — Other Ambulatory Visit (HOSPITAL_BASED_OUTPATIENT_CLINIC_OR_DEPARTMENT_OTHER): Payer: Self-pay

## 2022-10-07 VITALS — BP 114/50 | HR 85 | Temp 98.0°F | Resp 16 | Ht 71.0 in | Wt 185.0 lb

## 2022-10-07 DIAGNOSIS — U071 COVID-19: Secondary | ICD-10-CM | POA: Diagnosis not present

## 2022-10-07 DIAGNOSIS — R059 Cough, unspecified: Secondary | ICD-10-CM | POA: Diagnosis not present

## 2022-10-07 LAB — POC COVID19 BINAXNOW: SARS Coronavirus 2 Ag: POSITIVE — AB

## 2022-10-07 MED ORDER — HYDROCOD POLI-CHLORPHE POLI ER 10-8 MG/5ML PO SUER: 5.0000 mL | Freq: Two times a day (BID) | ORAL | 0 refills | Status: DC | PRN

## 2022-10-07 MED ORDER — HYDROCOD POLI-CHLORPHE POLI ER 10-8 MG/5ML PO SUER
5.0000 mL | Freq: Two times a day (BID) | ORAL | 0 refills | Status: AC | PRN
  Filled 2022-10-07: qty 50, 5d supply, fill #0

## 2022-10-07 MED ORDER — MOLNUPIRAVIR EUA 200MG CAPSULE: 4.0000 | ORAL_CAPSULE | Freq: Two times a day (BID) | ORAL | 0 refills | Status: AC

## 2022-10-07 NOTE — Addendum Note (Signed)
Addended by: Caleen Jobs B on: 10/07/2022 05:06 PM   Modules accepted: Orders

## 2022-10-07 NOTE — Telephone Encounter (Signed)
Patient's wife called to advise that neither [facility] or [place] are out of the chlorpheniramine-HYDROcodone ([place]) 10-8 MG/5ML  and she wants to know if we can send it in to pharmacy downstairs and to please call her [phone]) so she can pick it up because he does not feel good

## 2022-10-07 NOTE — Progress Notes (Signed)
Acute Office Visit  Subjective:     Patient ID: Francis Shaw, male    DOB: [birth year], 82 y.o.   MRN: [id number]  Chief Complaint  Patient presents with   Sinus Problem   Nasal Congestion   Sore Throat   Cough    HPI Patient is in today for URI symptoms.   Patient reports that about 4-5 days ago he started feeling poorly. Wife recently got over a cough, but she didn't have any other symptoms. His symptoms have included fever up to 101, productive cough with yellow sputum, sinus pressure, nasal congestion, headache, body aches, fatigue. He denies any chest pain, dyspnea, GI/GU symptoms. So far he has tried Mucinex, Nyquil, Flonase.    ROS All review of systems negative except what is listed in the HPI      Objective:    BP (!) 114/50   Pulse 85   Temp 98 F (36.7 C)   Resp 16   Ht '5\' 11"'$  (1.803 m)   Wt 185 lb (83.9 kg)   SpO2 96%   BMI 25.80 kg/m     Physical Exam Vitals reviewed.  Constitutional:      General: He is not in acute distress.    Appearance: He is well-developed. He is ill-appearing.  HENT:     Head: Normocephalic and atraumatic.  Cardiovascular:     Rate and Rhythm: Normal rate and regular rhythm.  Pulmonary:     Effort: Pulmonary effort is normal.     Breath sounds: Normal breath sounds.  Musculoskeletal:     Cervical back: Normal range of motion and neck supple.  Skin:    General: Skin is warm and dry.  Neurological:     General: No focal deficit present.     Mental Status: He is alert and oriented to person, place, and time.  Psychiatric:        Mood and Affect: Mood normal.        Behavior: Behavior normal.       Results for orders placed or performed in visit on 10/07/22  POC COVID-19  Result Value Ref Range   SARS Coronavirus 2 Ag Positive (A) Negative        Assessment & Plan:   Problem List Items Addressed This Visit       Other   Cough - Primary   Relevant Medications   chlorpheniramine-HYDROcodone  (TUSSIONEX) 10-8 MG/5ML   Other Relevant Orders   POC COVID-19 (Completed)   Other Visit Diagnoses     COVID-19       Relevant Medications   molnupiravir EUA (LAGEVRIO) 200 mg CAPS capsule   chlorpheniramine-HYDROcodone (TUSSIONEX) 10-8 MG/5ML      COVID + Discussed options with patient. He would like to try antiviral therapy and cough syrup.  Adding molnupiravir - medication discussed and education provided. Continue supportive measures including rest, hydration, humidifier use, steam showers, warm compresses to sinuses, warm liquids with lemon and honey, and over-the-counter cough, cold, and analgesics as needed.  Additional information added to AVS.  Patient aware of signs/symptoms requiring further/urgent evaluation.      Meds ordered this encounter  Medications   molnupiravir EUA (LAGEVRIO) 200 mg CAPS capsule    Sig: Take 4 capsules (800 mg total) by mouth 2 (two) times daily for 5 days.    Dispense:  40 capsule    Refill:  0    Order Specific Question:   Supervising Provider    Answer:   Charlett Blake,  STACEY A [[id number]]   chlorpheniramine-HYDROcodone (TUSSIONEX) 10-8 MG/5ML    Sig: Take 5 mLs by mouth every 12 (twelve) hours as needed for up to 5 days.    Dispense:  50 mL    Refill:  0    Order Specific Question:   Supervising Provider    Answer:   Penni Homans A [[id number]]    Return if symptoms worsen or fail to improve.  Terrilyn Saver, NP

## 2022-10-07 NOTE — Patient Instructions (Signed)

## 2022-10-14 ENCOUNTER — Encounter: Payer: Self-pay | Admitting: Family Medicine

## 2022-10-14 DIAGNOSIS — U071 COVID-19: Secondary | ICD-10-CM

## 2022-10-14 DIAGNOSIS — R059 Cough, unspecified: Secondary | ICD-10-CM

## 2022-10-14 DIAGNOSIS — J0140 Acute pansinusitis, unspecified: Secondary | ICD-10-CM

## 2022-10-14 MED ORDER — AMOXICILLIN-POT CLAVULANATE 875-125 MG PO TABS: 1.0000 | ORAL_TABLET | Freq: Two times a day (BID) | ORAL | 0 refills | Status: AC

## 2022-10-15 ENCOUNTER — Other Ambulatory Visit (HOSPITAL_BASED_OUTPATIENT_CLINIC_OR_DEPARTMENT_OTHER): Payer: Self-pay

## 2022-10-15 MED ORDER — HYDROCOD POLI-CHLORPHE POLI ER 10-8 MG/5ML PO SUER
5.0000 mL | Freq: Two times a day (BID) | ORAL | 0 refills | Status: AC | PRN
  Filled 2022-10-15: qty 50, 5d supply, fill #0

## 2022-10-15 NOTE — Addendum Note (Signed)
Addended by: Caleen Jobs B on: 10/15/2022 12:53 PM   Modules accepted: Orders

## 2022-11-25 ENCOUNTER — Other Ambulatory Visit: Payer: Self-pay | Admitting: Cardiology

## 2022-11-25 DIAGNOSIS — I480 Paroxysmal atrial fibrillation: Secondary | ICD-10-CM

## 2022-11-25 NOTE — Telephone Encounter (Signed)
Prescription refill request for Xarelto received.  Indication: AF Last office visit: 06/07/22  Elwyn Reach NP Weight: 83.9kg Age: 82 Scr: 0.96 on 02/20/22 CrCl: 72.83  Based on above findings Xarelto '20mg'$  daily is the appropriate dose.  Refill approved.

## 2022-12-29 ENCOUNTER — Other Ambulatory Visit: Payer: Self-pay | Admitting: Cardiology

## 2022-12-29 DIAGNOSIS — I428 Other cardiomyopathies: Secondary | ICD-10-CM

## 2023-01-13 DIAGNOSIS — Z825 Family history of asthma and other chronic lower respiratory diseases: Secondary | ICD-10-CM | POA: Diagnosis not present

## 2023-01-13 DIAGNOSIS — N529 Male erectile dysfunction, unspecified: Secondary | ICD-10-CM | POA: Diagnosis not present

## 2023-01-13 DIAGNOSIS — M1990 Unspecified osteoarthritis, unspecified site: Secondary | ICD-10-CM | POA: Diagnosis not present

## 2023-01-13 DIAGNOSIS — N400 Enlarged prostate without lower urinary tract symptoms: Secondary | ICD-10-CM | POA: Diagnosis not present

## 2023-01-13 DIAGNOSIS — H259 Unspecified age-related cataract: Secondary | ICD-10-CM | POA: Diagnosis not present

## 2023-01-13 DIAGNOSIS — Z7901 Long term (current) use of anticoagulants: Secondary | ICD-10-CM | POA: Diagnosis not present

## 2023-01-13 DIAGNOSIS — I4891 Unspecified atrial fibrillation: Secondary | ICD-10-CM | POA: Diagnosis not present

## 2023-01-13 DIAGNOSIS — Z89021 Acquired absence of right finger(s): Secondary | ICD-10-CM | POA: Diagnosis not present

## 2023-01-13 DIAGNOSIS — I10 Essential (primary) hypertension: Secondary | ICD-10-CM | POA: Diagnosis not present

## 2023-01-13 DIAGNOSIS — Z87891 Personal history of nicotine dependence: Secondary | ICD-10-CM | POA: Diagnosis not present

## 2023-01-13 DIAGNOSIS — D6869 Other thrombophilia: Secondary | ICD-10-CM | POA: Diagnosis not present

## 2023-02-05 ENCOUNTER — Other Ambulatory Visit: Payer: Self-pay | Admitting: *Deleted

## 2023-02-05 DIAGNOSIS — I059 Rheumatic mitral valve disease, unspecified: Secondary | ICD-10-CM

## 2023-02-18 ENCOUNTER — Encounter: Payer: Self-pay | Admitting: Cardiology

## 2023-03-03 ENCOUNTER — Other Ambulatory Visit: Payer: Self-pay | Admitting: Cardiology

## 2023-03-03 DIAGNOSIS — I428 Other cardiomyopathies: Secondary | ICD-10-CM

## 2023-03-13 ENCOUNTER — Ambulatory Visit (HOSPITAL_BASED_OUTPATIENT_CLINIC_OR_DEPARTMENT_OTHER)
Admission: RE | Admit: 2023-03-13 | Discharge: 2023-03-13 | Disposition: A | Discharge status: Home / Self Care | Payer: Medicare HMO | Source: Ambulatory Visit | Attending: Cardiology | Admitting: Cardiology

## 2023-03-13 DIAGNOSIS — I059 Rheumatic mitral valve disease, unspecified: Secondary | ICD-10-CM | POA: Diagnosis not present

## 2023-03-13 DIAGNOSIS — I340 Nonrheumatic mitral (valve) insufficiency: Secondary | ICD-10-CM | POA: Diagnosis not present

## 2023-03-13 DIAGNOSIS — I083 Combined rheumatic disorders of mitral, aortic and tricuspid valves: Secondary | ICD-10-CM

## 2023-03-13 DIAGNOSIS — I517 Cardiomegaly: Secondary | ICD-10-CM | POA: Diagnosis not present

## 2023-03-13 LAB — ECHOCARDIOGRAM COMPLETE
AR max vel: 2.05 cm2
AV Area VTI: 2.19 cm2
AV Area mean vel: 2.2 cm2
AV Mean grad: 3 mmHg
AV Peak grad: 6.7 mmHg
AV Vena cont: 0.3 cm
Ao pk vel: 1.29 m/s
Area-P 1/2: 4.96 cm2
Calc EF: 54 %
MV M vel: 3.25 m/s
MV Peak grad: 42.2 mmHg
MV Vena cont: 0.2 cm
P 1/2 time: 1403 msec
Radius: 0.4 cm
S' Lateral: 2.6 cm
Single Plane A2C EF: 55.5 %
Single Plane A4C EF: 51.1 %

## 2023-04-11 ENCOUNTER — Ambulatory Visit (HOSPITAL_BASED_OUTPATIENT_CLINIC_OR_DEPARTMENT_OTHER): Payer: Medicare HMO | Admitting: Family

## 2023-04-11 ENCOUNTER — Encounter (HOSPITAL_BASED_OUTPATIENT_CLINIC_OR_DEPARTMENT_OTHER): Payer: Self-pay | Admitting: Family

## 2023-04-11 VITALS — BP 116/78 | HR 63 | Ht 71.0 in | Wt 180.9 lb

## 2023-04-11 DIAGNOSIS — I428 Other cardiomyopathies: Secondary | ICD-10-CM

## 2023-04-11 DIAGNOSIS — I480 Paroxysmal atrial fibrillation: Secondary | ICD-10-CM | POA: Diagnosis not present

## 2023-04-11 DIAGNOSIS — I482 Chronic atrial fibrillation, unspecified: Secondary | ICD-10-CM | POA: Diagnosis not present

## 2023-04-11 MED ORDER — RIVAROXABAN 20 MG PO TABS: 20.0000 mg | ORAL_TABLET | Freq: Every day | ORAL | 3 refills | Status: DC

## 2023-04-11 MED ORDER — CARVEDILOL 25 MG PO TABS: 25.0000 mg | ORAL_TABLET | Freq: Two times a day (BID) | ORAL | 3 refills | Status: DC

## 2023-04-11 NOTE — Progress Notes (Unsigned)
Cardiology Office Note:  .   Date:  04/11/2023  ID:  Francis Shaw, DOB 1940/12/07, MRN [id number] PCP: Francis Canary, MD  [facility] Providers Cardiologist:  Francis Millers, MD { Click to update primary MD,subspecialty MD or APP then REFRESH:1}   History of Present Illness: .   Francis Shaw is a 82 y.o. male ***  Occsaional dyspnea with hills, stairs that is overall stable. No orthopnea, PNE, edema.   He reports no palpitations.  BP 120-140  Echo 03/13/23 with mild to moderate MR.   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   Xarelto usually gets expensive in October $45 switch itt o $200  ROS: Please see the history of present illness.    All other systems reviewed and are negative.   Studies Reviewed: .        Cardiac Studies & Procedures       ECHOCARDIOGRAM  ECHOCARDIOGRAM COMPLETE 03/13/2023  Narrative ECHOCARDIOGRAM REPORT    Patient Name:   Francis Shaw Date of Exam: 03/13/2023 Medical Rec #:  [id number]     Height:       71.0 in Accession #:    [id number]    Weight:       185.0 lb Date of Birth:  1940-10-31    BSA:          2.040 m Patient Age:    81 years      BP:           114/50 mmHg Patient Gender: M             HR:           55 bpm. Exam Location:  [place]  Procedure: 2D Echo, 3D Echo, Cardiac Doppler and Color Doppler  Indications:    Mitral valve disorder [I05.9 (ICD-10-CM)]  History:        Patient has prior history of Echocardiogram examinations, most recent 03/12/2022. Cardiomyopathy and CHF, Mitral Valve Disease, Arrythmias:Atrial Fibrillation; Risk Factors:Hypertension and Former Smoker.  Sonographer:    Jake Seats RDMS, RVT, RDCS Referring Phys: [id number] BRIAN S CRENSHAW  IMPRESSIONS   1. Left ventricular ejection fraction, by estimation, is 60 to 65%. The left ventricle has normal function. The left ventricle has no regional wall motion abnormalities. Left ventricular  diastolic parameters are indeterminate. 2. Right ventricular systolic function is normal. The right ventricular size is normal. 3. Left atrial size was mildly dilated. 4. The mitral valve is normal in structure. Mild to moderate mitral valve regurgitation. No evidence of mitral stenosis. 5. Tricuspid valve regurgitation is mild to moderate. 6. The aortic valve is normal in structure. Aortic valve regurgitation is mild. Aortic valve sclerosis is present, with no evidence of aortic valve stenosis. 7. The inferior vena cava is normal in size with greater than 50% respiratory variability, suggesting right atrial pressure of 3 mmHg.  Comparison(s): Echoacdriogram done 6/20 23 showed an EF of 45-50%.  FINDINGS Left Ventricle: Left ventricular ejection fraction, by estimation, is 60 to 65%. The left ventricle has normal function. The left ventricle has no regional wall motion abnormalities. The left ventricular internal cavity size was normal in size. There is no left ventricular hypertrophy. Left ventricular diastolic parameters are indeterminate.  Right Ventricle: The right ventricular size is normal. No increase in right ventricular wall thickness. Right ventricular systolic function is normal.  Left Atrium: Left atrial size was mildly dilated.  Right Atrium: Right atrial size was  normal in size.  Pericardium: There is no evidence of pericardial effusion.  Mitral Valve: The mitral valve is normal in structure. Mild to moderate mitral valve regurgitation. No evidence of mitral valve stenosis.  Tricuspid Valve: The tricuspid valve is normal in structure. Tricuspid valve regurgitation is mild to moderate. No evidence of tricuspid stenosis.  Aortic Valve: The aortic valve is normal in structure. Aortic valve regurgitation is mild. Aortic regurgitation PHT measures 1403 msec. Aortic valve sclerosis is present, with no evidence of aortic valve stenosis. Aortic valve mean gradient measures 3.0 mmHg.  Aortic valve peak gradient measures 6.7 mmHg. Aortic valve area, by VTI measures 2.19 cm.  Pulmonic Valve: The pulmonic valve was normal in structure. Pulmonic valve regurgitation is not visualized. No evidence of pulmonic stenosis.  Aorta: The aortic root is normal in size and structure.  Venous: The inferior vena cava is normal in size with greater than 50% respiratory variability, suggesting right atrial pressure of 3 mmHg.  IAS/Shunts: No atrial level shunt detected by color flow Doppler.   LEFT VENTRICLE PLAX 2D LVIDd:         4.20 cm     Diastology LVIDs:         2.60 cm     LV e' medial:    8.16 cm/s LV PW:         1.20 cm     LV E/e' medial:  12.0 LV IVS:        1.30 cm     LV e' lateral:   8.43 cm/s LVOT diam:     1.90 cm     LV E/e' lateral: 11.6 LV SV:         57 LV SV Index:   28 LVOT Area:     2.84 cm  3D Volume EF: LV Volumes (MOD)           3D EF:        51 % LV vol d, MOD A2C: 57.1 ml LV EDV:       140 ml LV vol d, MOD A4C: 57.1 ml LV ESV:       68 ml LV vol s, MOD A2C: 25.4 ml LV SV:        71 ml LV vol s, MOD A4C: 27.9 ml LV SV MOD A2C:     31.7 ml LV SV MOD A4C:     57.1 ml LV SV MOD BP:      31.2 ml  RIGHT VENTRICLE RV S prime:     8.98 cm/s TAPSE (M-mode): 1.9 cm  LEFT ATRIUM             Index        RIGHT ATRIUM           Index LA diam:        4.40 cm 2.16 cm/m   RA Area:     20.00 cm LA Vol (A2C):   56.8 ml 27.85 ml/m  RA Volume:   56.00 ml  27.45 ml/m LA Vol (A4C):   53.8 ml 26.37 ml/m LA Biplane Vol: 57.2 ml 28.04 ml/m AORTIC VALVE AV Area (Vmax):    2.05 cm AV Area (Vmean):   2.20 cm AV Area (VTI):     2.19 cm AV Vmax:           129.00 cm/s AV Vmean:          87.200 cm/s AV VTI:  0.261 m AV Peak Grad:      6.7 mmHg AV Mean Grad:      3.0 mmHg LVOT Vmax:         93.20 cm/s LVOT Vmean:        67.700 cm/s LVOT VTI:          0.202 m LVOT/AV VTI ratio: 0.77 AI PHT:            1403 msec AR Vena Contracta: 0.30  cm  AORTA Ao Root diam: 3.10 cm Ao Asc diam:  3.10 cm  MITRAL VALVE                    TRICUSPID VALVE MV Area (PHT): 4.96 cm         TR Peak grad:   25.8 mmHg MV Decel Time: 153 msec         TR Vmax:        254.00 cm/s MR Peak grad:      42.2 mmHg MR Mean grad:      56.0 mmHg    SHUNTS MR Vmax:           324.67 cm/s  Systemic VTI:  0.20 m MR Vmean:          357.0 cm/s   Systemic Diam: 1.90 cm MR Vena Contracta: 0.20 cm MR PISA:           1.01 cm MR PISA Eff ROA:   7 mm MR PISA Radius:    0.40 cm MV E velocity: 97.90 cm/s MV A velocity: 30.00 cm/s MV E/A ratio:  3.26  Belva Crome MD Electronically signed by Belva Crome MD Signature Date/Time: 03/13/2023/4:55:08 PM    Final             Risk Assessment/Calculations:    CHA2DS2-VASc Score = 4   This indicates a 4.8% annual risk of stroke. The patient's score is based upon: CHF History: 1 HTN History: 1 Diabetes History: 0 Stroke History: 0 Vascular Disease History: 0 Age Score: 2 Gender Score: 0   {This patient has a significant risk of stroke if diagnosed with atrial fibrillation.  Please consider VKA or DOAC agent for anticoagulation if the bleeding risk is acceptable.   You can also use the SmartPhrase .[email or web address] for documentation.   :[id number]}         Physical Exam:   VS:  BP 116/78 (BP Location: Left Arm, Patient Position: Sitting, Cuff Size: Normal)   Pulse 63   Ht 5\' 11"  (1.803 m)   Wt 180 lb 14.4 oz (82.1 kg)   BMI 25.23 kg/m    Wt Readings from Last 3 Encounters:  04/11/23 180 lb 14.4 oz (82.1 kg)  10/07/22 185 lb (83.9 kg)  06/10/22 183 lb 1.6 oz (83.1 kg)    GEN: Well nourished, well developed in no acute distress NECK: No JVD; No carotid bruits CARDIAC: ***RRR, no murmurs, rubs, gallops RESPIRATORY:  Clear to auscultation without rales, wheezing or rhonchi  ABDOMEN: Soft, non-tender, non-distended EXTREMITIES:  No edema; No deformity   ASSESSMENT AND PLAN: .   ***    {Are you  ordering a CV Procedure (e.g. stress test, cath, DCCV, TEE, etc)?   Press F2        :[id number]}  Dispo: ***  Signed, Alver Sorrow, NP

## 2023-04-11 NOTE — Patient Instructions (Addendum)
Medication Instructions:  Your physician recommends that you continue on your current medications as directed. Please refer to the Current Medication list given to you today.  *If you need a refill on your cardiac medications before your next appointment, please call your pharmacy*   Lab Work: Your physician recommends that you return for lab work today- BMP and CBC   Follow-Up: At [facility], you and your health needs are our priority.  As part of our continuing mission to provide you with exceptional heart care, we have created designated Provider Care Teams.  These Care Teams include your primary Cardiologist (physician) and Advanced Practice Providers (APPs -  Physician Assistants and Nurse Practitioners) who all work together to provide you with the care you need, when you need it.  We recommend signing up for the patient portal called "MyChart".  Sign up information is provided on this After Visit Summary.  MyChart is used to connect with patients for Virtual Visits (Telemedicine).  Patients are able to view lab/test results, encounter notes, upcoming appointments, etc.  Non-urgent messages can be sent to your provider as well.   To learn more about what you can do with MyChart, go to [email or web address].    Your next appointment:   1 year with Dr. Jens Som or Gillian Shields, NP   Other Instructions When you hit the donut hole visit the below site or call  [email or web address]

## 2023-04-12 LAB — CBC
Hematocrit: 42.9 % (ref 37.5–51.0)
Hemoglobin: 14.6 g/dL (ref 13.0–17.7)
MCH: 29.8 pg (ref 26.6–33.0)
MCHC: 34 g/dL (ref 31.5–35.7)
MCV: 88 fL (ref 79–97)
Platelets: 157 10*3/uL (ref 150–450)
RBC: 4.9 x10E6/uL (ref 4.14–5.80)
RDW: 12.1 % (ref 11.6–15.4)
WBC: 6.7 10*3/uL (ref 3.4–10.8)

## 2023-04-12 LAB — BASIC METABOLIC PANEL
BUN/Creatinine Ratio: 16 (ref 10–24)
BUN: 15 mg/dL (ref 8–27)
CO2: 25 mmol/L (ref 20–29)
Calcium: 9.7 mg/dL (ref 8.6–10.2)
Chloride: 102 mmol/L (ref 96–106)
Creatinine, Ser: 0.91 mg/dL (ref 0.76–1.27)
Glucose: 74 mg/dL (ref 70–99)
Potassium: 4.9 mmol/L (ref 3.5–5.2)
Sodium: 142 mmol/L (ref 134–144)
eGFR: 85 mL/min/{1.73_m2} (ref >59)

## 2023-04-17 ENCOUNTER — Encounter (HOSPITAL_BASED_OUTPATIENT_CLINIC_OR_DEPARTMENT_OTHER): Payer: Self-pay | Admitting: Family

## 2023-04-24 DIAGNOSIS — L814 Other melanin hyperpigmentation: Secondary | ICD-10-CM | POA: Diagnosis not present

## 2023-04-24 DIAGNOSIS — L570 Actinic keratosis: Secondary | ICD-10-CM | POA: Diagnosis not present

## 2023-04-24 DIAGNOSIS — D225 Melanocytic nevi of trunk: Secondary | ICD-10-CM | POA: Diagnosis not present

## 2023-04-24 DIAGNOSIS — L821 Other seborrheic keratosis: Secondary | ICD-10-CM | POA: Diagnosis not present

## 2023-04-28 DIAGNOSIS — M25511 Pain in right shoulder: Secondary | ICD-10-CM | POA: Diagnosis not present

## 2023-04-28 DIAGNOSIS — M19032 Primary osteoarthritis, left wrist: Secondary | ICD-10-CM | POA: Diagnosis not present

## 2023-04-30 DIAGNOSIS — H11001 Unspecified pterygium of right eye: Secondary | ICD-10-CM | POA: Diagnosis not present

## 2023-04-30 DIAGNOSIS — H2513 Age-related nuclear cataract, bilateral: Secondary | ICD-10-CM | POA: Diagnosis not present

## 2023-04-30 DIAGNOSIS — H5203 Hypermetropia, bilateral: Secondary | ICD-10-CM | POA: Diagnosis not present

## 2023-06-03 DIAGNOSIS — M25511 Pain in right shoulder: Secondary | ICD-10-CM | POA: Diagnosis not present

## 2023-06-03 DIAGNOSIS — M79642 Pain in left hand: Secondary | ICD-10-CM | POA: Diagnosis not present

## 2023-06-10 DIAGNOSIS — M25611 Stiffness of right shoulder, not elsewhere classified: Secondary | ICD-10-CM | POA: Diagnosis not present

## 2023-08-06 ENCOUNTER — Encounter: Payer: Self-pay | Admitting: Family Medicine

## 2023-08-06 ENCOUNTER — Ambulatory Visit: Payer: Medicare HMO | Admitting: Family Medicine

## 2023-08-06 VITALS — BP 139/84 | HR 60 | Ht 71.0 in | Wt 182.0 lb

## 2023-08-06 DIAGNOSIS — I480 Paroxysmal atrial fibrillation: Secondary | ICD-10-CM | POA: Diagnosis not present

## 2023-08-06 DIAGNOSIS — Z0000 Encounter for general adult medical examination without abnormal findings: Secondary | ICD-10-CM | POA: Diagnosis not present

## 2023-08-06 DIAGNOSIS — I10 Essential (primary) hypertension: Secondary | ICD-10-CM

## 2023-08-06 DIAGNOSIS — E782 Mixed hyperlipidemia: Secondary | ICD-10-CM

## 2023-08-06 LAB — COMPREHENSIVE METABOLIC PANEL
ALT: 11 U/L (ref 0–53)
AST: 16 U/L (ref 0–37)
Albumin: 4 g/dL (ref 3.5–5.2)
Alkaline Phosphatase: 87 U/L (ref 39–117)
BUN: 12 mg/dL (ref 6–23)
CO2: 31 meq/L (ref 19–32)
Calcium: 9.5 mg/dL (ref 8.4–10.5)
Chloride: 106 meq/L (ref 96–112)
Creatinine, Ser: 0.85 mg/dL (ref 0.40–1.50)
GFR: 81.79 mL/min (ref >60.00)
Glucose, Bld: 91 mg/dL (ref 70–99)
Potassium: 4.6 meq/L (ref 3.5–5.1)
Sodium: 141 meq/L (ref 135–145)
Total Bilirubin: 1 mg/dL (ref 0.2–1.2)
Total Protein: 6.4 g/dL (ref 6.0–8.3)

## 2023-08-06 LAB — CBC WITH DIFFERENTIAL/PLATELET
Basophils Absolute: 0.1 10*3/uL (ref 0.0–0.1)
Basophils Relative: 0.9 % (ref 0.0–3.0)
Eosinophils Absolute: 0.3 10*3/uL (ref 0.0–0.7)
Eosinophils Relative: 4.3 % (ref 0.0–5.0)
HCT: 42.8 % (ref 39.0–52.0)
Hemoglobin: 14.6 g/dL (ref 13.0–17.0)
Lymphocytes Relative: 18.6 % (ref 12.0–46.0)
Lymphs Abs: 1.2 10*3/uL (ref 0.7–4.0)
MCHC: 34.1 g/dL (ref 30.0–36.0)
MCV: 91 fL (ref 78.0–100.0)
Monocytes Absolute: 0.9 10*3/uL (ref 0.1–1.0)
Monocytes Relative: 14.2 % — ABNORMAL HIGH (ref 3.0–12.0)
Neutro Abs: 3.8 10*3/uL (ref 1.4–7.7)
Neutrophils Relative %: 62 % (ref 43.0–77.0)
Platelets: 170 10*3/uL (ref 150.0–400.0)
RBC: 4.7 Mil/uL (ref 4.22–5.81)
RDW: 13.2 % (ref 11.5–15.5)
WBC: 6.2 10*3/uL (ref 4.0–10.5)

## 2023-08-06 LAB — TSH: TSH: 1.84 u[IU]/mL (ref 0.35–5.50)

## 2023-08-06 LAB — LIPID PANEL
Cholesterol: 142 mg/dL (ref 0–200)
HDL: 42 mg/dL (ref >39.00)
LDL Cholesterol: 85 mg/dL (ref 0–99)
NonHDL: 99.73
Total CHOL/HDL Ratio: 3
Triglycerides: 75 mg/dL (ref 0.0–149.0)
VLDL: 15 mg/dL (ref 0.0–40.0)

## 2023-08-06 NOTE — Assessment & Plan Note (Signed)
Following with Dr. Jens Som On Eliquis

## 2023-08-06 NOTE — Assessment & Plan Note (Signed)
Blood pressure is at goal for age and co-morbidities.   Recommendations: continue current regimen. Following with cardiology.  - BP goal <130/80 - monitor and log blood pressures at home - check around the same time each day in a relaxed setting - Limit salt to <2000 mg/day - Follow DASH eating plan (heart healthy diet) - limit alcohol to 2 standard drinks per day for men and 1 per day for women - avoid tobacco products - get at least 2 hours of regular aerobic exercise weekly Patient aware of signs/symptoms requiring further/urgent evaluation. Labs updated today.

## 2023-08-06 NOTE — Progress Notes (Signed)
Complete physical exam  Patient: Francis Shaw   DOB: 11/23/1940   82 y.o. Male  MRN: [id number]  Subjective:    Chief Complaint  Patient presents with   Annual Exam    Francis Shaw is a 82 y.o. male who presents today for a complete physical exam. He reports consuming a general diet. The patient does not participate in regular exercise at present. He generally feels well. He reports sleeping well. He does not have additional problems to discuss today.   Currently lives with: wife Acute concerns or interim problems since last visit: no  Vision concerns: no Dental concerns: no   ETOH use: rare Nicotine use: no Recreational drugs/illegal substances: no     Most recent fall risk assessment:    08/06/2023   10:04 AM  Fall Risk   Falls in the past year? 0  Number falls in past yr: 0  Injury with Fall? 0  Risk for fall due to : No Fall Risks  Follow up Falls evaluation completed     Most recent depression screenings:    08/06/2023   10:05 AM 11/09/2021    8:27 AM  PHQ 2/9 Scores  PHQ - 2 Score 0 0             Patient Care Team: Bradd Canary, MD as PCP - General (Family Medicine) Jens Som Madolyn Frieze, MD as PCP - Cardiology (Cardiology) Jens Som Madolyn Frieze, MD as Consulting Physician (Cardiology) Jerilee Field, MD as Consulting Physician (Urology)   Outpatient Medications Prior to Visit  Medication Sig   b complex vitamins capsule Take 1 capsule by mouth daily.   Baclofen 5 MG TABS Take 5-10 mg by mouth 3 (three) times daily as needed.   carvedilol (COREG) 25 MG tablet Take 1 tablet (25 mg total) by mouth 2 (two) times daily with a meal.   Cholecalciferol (VITAMIN D3 PO) Take by mouth daily.   losartan (COZAAR) 50 MG tablet TAKE 1 TABLET BY MOUTH EVERY DAY   Multiple Vitamins-Minerals (CENTRUM SILVER PO) Take 1 tablet by mouth daily.   rivaroxaban (XARELTO) 20 MG TABS tablet Take 1 tablet (20 mg total) by mouth daily with supper.   tamsulosin  (FLOMAX) 0.4 MG CAPS capsule Take 0.4 mg by mouth as needed.   [DISCONTINUED] fluticasone (FLONASE) 50 MCG/ACT nasal spray Place 2 sprays into both nostrils daily as needed for allergies or rhinitis.   [DISCONTINUED] loratadine (CLARITIN) 10 MG tablet TAKE 1 TABLET (10 MG TOTAL) BY MOUTH 2 (TWO) TIMES DAILY AS NEEDED FOR ALLERGIES.   [DISCONTINUED] ondansetron (ZOFRAN) 8 MG tablet Take 1 tablet (8 mg total) by mouth every 8 (eight) hours as needed for nausea or vomiting (after lithotripsy).   [DISCONTINUED] predniSONE (DELTASONE) 20 MG tablet Take 1 tablet (20 mg total) by mouth daily with breakfast.   No facility-administered medications prior to visit.    ROS All review of systems negative except what is listed in the HPI       Objective:     BP 139/84   Pulse 60   Ht 5\' 11"  (1.803 m)   Wt 182 lb (82.6 kg)   SpO2 97%   BMI 25.38 kg/m     Physical Exam Vitals reviewed.  Constitutional:      General: He is not in acute distress.    Appearance: Normal appearance. He is not ill-appearing.  HENT:     Head: Normocephalic and atraumatic.     Right Ear: Tympanic membrane  normal.     Left Ear: Tympanic membrane normal.     Nose: Nose normal.     Mouth/Throat:     Mouth: Mucous membranes are moist.     Pharynx: Oropharynx is clear.  Eyes:     Extraocular Movements: Extraocular movements intact.     Conjunctiva/sclera: Conjunctivae normal.     Pupils: Pupils are equal, round, and reactive to light.  Neck:     Vascular: No carotid bruit.  Cardiovascular:     Rate and Rhythm: Normal rate and regular rhythm.     Pulses: Normal pulses.     Heart sounds: Normal heart sounds.  Pulmonary:     Effort: Pulmonary effort is normal.     Breath sounds: Normal breath sounds.  Abdominal:     General: Abdomen is flat. Bowel sounds are normal. There is no distension.     Palpations: Abdomen is soft. There is no mass.     Tenderness: There is no abdominal tenderness. There is no right  CVA tenderness, left CVA tenderness, guarding or rebound.     Hernia: A hernia is present. Hernia is present in the umbilical area.  Genitourinary:    Comments: Deferred exam Musculoskeletal:        General: Normal range of motion.     Cervical back: Normal range of motion and neck supple. No tenderness.     Right lower leg: No edema.     Left lower leg: No edema.  Lymphadenopathy:     Cervical: No cervical adenopathy.  Skin:    General: Skin is warm and dry.     Capillary Refill: Capillary refill takes less than 2 seconds.  Neurological:     General: No focal deficit present.     Mental Status: He is alert and oriented to person, place, and time. Mental status is at baseline.  Psychiatric:        Mood and Affect: Mood normal.        Behavior: Behavior normal.        Thought Content: Thought content normal.        Judgment: Judgment normal.          No results found for any visits on 08/06/23.      Assessment & Plan:    Routine Health Maintenance and Physical Exam Discussed health promotion and safety including diet and exercise recommendations, dental health, and injury prevention. Tobacco cessation if applicable. Seat belts, sunscreen, smoke detectors, etc.    Immunization History  Administered Date(s) Administered   Influenza, High Dose Seasonal PF 07/23/2018   PFIZER(Purple Top)SARS-COV-2 Vaccination 10/03/2019, 10/24/2019, 07/31/2020   Pneumococcal Conjugate-13 05/01/2015   Pneumococcal Polysaccharide-23 03/05/2012   Td 09/23/2006   Tdap 03/05/2012   Zoster Recombinant(Shingrix) 05/21/2018   Zoster, Live 10/24/2013    Health Maintenance  Topic Date Due   Medicare Annual Wellness (AWV)  05/25/2021   COVID-19 Vaccine (4 - 2023-24 season) 08/22/2023 (Originally 05/25/2023)   Zoster Vaccines- Shingrix (2 of 2) 11/06/2023 (Originally 07/16/2018)   INFLUENZA VACCINE  12/22/2023 (Originally 04/24/2023)   DTaP/Tdap/Td (3 - Td or Tdap) 08/05/2024 (Originally 03/05/2022)    Pneumonia Vaccine 15+ Years old  Completed   HPV VACCINES  Aged Out   Colonoscopy  Discontinued        Problem List Items Addressed This Visit       Active Problems   Hyperlipidemia    Medication management: lifestyle measures Lifestyle factors for lowering cholesterol include: Diet therapy - heart-healthy diet rich  in fruits, veggies, fiber-rich whole grains, lean meats, chicken, fish (at least twice a week), fat-free or 1% dairy products; foods low in saturated/trans fats, cholesterol, sodium, and sugar. Mediterranean diet has shown to be very heart healthy. Regular exercise - recommend at least 30 minutes a day, 5 times per week Weight management  Repeat CMP and lipid panel today       Relevant Orders   Lipid panel   Atrial fibrillation (HCC)    Following with Dr. Jens Som On Eliquis      HTN (hypertension)    Blood pressure is at goal for age and co-morbidities.   Recommendations: continue current regimen. Following with cardiology.  - BP goal <130/80 - monitor and log blood pressures at home - check around the same time each day in a relaxed setting - Limit salt to <2000 mg/day - Follow DASH eating plan (heart healthy diet) - limit alcohol to 2 standard drinks per day for men and 1 per day for women - avoid tobacco products - get at least 2 hours of regular aerobic exercise weekly Patient aware of signs/symptoms requiring further/urgent evaluation. Labs updated today.       Relevant Orders   Comprehensive metabolic panel   Other Visit Diagnoses     Annual physical exam    -  Primary   Relevant Orders   CBC with Differential/Platelet   Comprehensive metabolic panel   Lipid panel   TSH      Return in about 6 months (around 02/03/2024) for routine follow-up; schedule AWV.     Clayborne Dana, NP

## 2023-08-06 NOTE — Assessment & Plan Note (Signed)
Medication management: lifestyle measures Lifestyle factors for lowering cholesterol include: Diet therapy - heart-healthy diet rich in fruits, veggies, fiber-rich whole grains, lean meats, chicken, fish (at least twice a week), fat-free or 1% dairy products; foods low in saturated/trans fats, cholesterol, sodium, and sugar. Mediterranean diet has shown to be very heart healthy. Regular exercise - recommend at least 30 minutes a day, 5 times per week Weight management  Repeat CMP and lipid panel today

## 2023-09-19 DIAGNOSIS — H0011 Chalazion right upper eyelid: Secondary | ICD-10-CM | POA: Diagnosis not present

## 2023-09-19 DIAGNOSIS — H02102 Unspecified ectropion of right lower eyelid: Secondary | ICD-10-CM | POA: Diagnosis not present

## 2023-09-19 DIAGNOSIS — H02105 Unspecified ectropion of left lower eyelid: Secondary | ICD-10-CM | POA: Diagnosis not present

## 2023-09-27 DIAGNOSIS — M79645 Pain in left finger(s): Secondary | ICD-10-CM | POA: Diagnosis not present

## 2023-09-27 DIAGNOSIS — M25511 Pain in right shoulder: Secondary | ICD-10-CM | POA: Diagnosis not present

## 2023-09-29 ENCOUNTER — Encounter: Payer: Self-pay | Admitting: Cardiology

## 2023-09-30 ENCOUNTER — Encounter: Payer: Self-pay | Admitting: Cardiology

## 2023-09-30 ENCOUNTER — Other Ambulatory Visit (HOSPITAL_BASED_OUTPATIENT_CLINIC_OR_DEPARTMENT_OTHER): Payer: Self-pay

## 2023-09-30 DIAGNOSIS — Z008 Encounter for other general examination: Secondary | ICD-10-CM | POA: Diagnosis not present

## 2023-09-30 MED ORDER — APIXABAN 5 MG PO TABS
5.0000 mg | ORAL_TABLET | Freq: Two times a day (BID) | ORAL | 6 refills | Status: DC
  Filled 2023-09-30: qty 60, 30d supply, fill #0

## 2023-09-30 NOTE — Telephone Encounter (Signed)
 Patient identification verified by 2 forms. Francis Ellen, RN     Called and spoke to patient  Patient states:  - The $2000.00 is his 2025 deductible amount for XARELTO               Interventions/Plan: -Informed patient there currently is not a generic version for Xarelto .  -Patient asked about starting warfarin, but declined once informed that med management would require frequent lab draws.  - Recommended patient contact [employer] to ask the following questions. 1. payment plan options for deductible.  2. If Eliquis  is a covered medication under his plan and cost for this medication.  - Patient to follow up with our office once he speaks with [facility]   Patient agrees with plan, no questions at this time

## 2023-09-30 NOTE — Addendum Note (Signed)
 Addended by: Freddi Starr on: 09/30/2023 03:22 PM   Modules accepted: Orders

## 2023-10-01 MED ORDER — RIVAROXABAN 20 MG PO TABS: 20.0000 mg | ORAL_TABLET | Freq: Every day | ORAL | Status: DC

## 2023-10-01 NOTE — Addendum Note (Signed)
 Addended by: Freddi Starr on: 10/01/2023 07:03 AM   Modules accepted: Orders

## 2023-10-03 DIAGNOSIS — H02105 Unspecified ectropion of left lower eyelid: Secondary | ICD-10-CM | POA: Diagnosis not present

## 2023-10-03 DIAGNOSIS — H02102 Unspecified ectropion of right lower eyelid: Secondary | ICD-10-CM | POA: Diagnosis not present

## 2023-10-29 ENCOUNTER — Encounter: Payer: Self-pay | Admitting: Cardiology

## 2023-10-29 ENCOUNTER — Other Ambulatory Visit: Payer: Self-pay | Admitting: *Deleted

## 2023-10-29 MED ORDER — RIVAROXABAN 20 MG PO TABS: 20.0000 mg | ORAL_TABLET | Freq: Every day | ORAL | 3 refills | Status: DC

## 2023-11-06 DIAGNOSIS — M79642 Pain in left hand: Secondary | ICD-10-CM | POA: Diagnosis not present

## 2023-11-06 DIAGNOSIS — M19011 Primary osteoarthritis, right shoulder: Secondary | ICD-10-CM | POA: Diagnosis not present

## 2024-01-01 ENCOUNTER — Encounter: Payer: Self-pay | Admitting: Cardiology

## 2024-02-27 ENCOUNTER — Encounter: Payer: Self-pay | Admitting: Cardiology

## 2024-03-05 DIAGNOSIS — H0012 Chalazion right lower eyelid: Secondary | ICD-10-CM | POA: Diagnosis not present

## 2024-03-05 DIAGNOSIS — H02102 Unspecified ectropion of right lower eyelid: Secondary | ICD-10-CM | POA: Diagnosis not present

## 2024-03-05 DIAGNOSIS — H02105 Unspecified ectropion of left lower eyelid: Secondary | ICD-10-CM | POA: Diagnosis not present

## 2024-03-23 DIAGNOSIS — H16211 Exposure keratoconjunctivitis, right eye: Secondary | ICD-10-CM | POA: Diagnosis not present

## 2024-03-23 DIAGNOSIS — Z01818 Encounter for other preprocedural examination: Secondary | ICD-10-CM | POA: Diagnosis not present

## 2024-03-23 DIAGNOSIS — H04521 Eversion of right lacrimal punctum: Secondary | ICD-10-CM | POA: Diagnosis not present

## 2024-03-23 DIAGNOSIS — H02142 Spastic ectropion of right lower eyelid: Secondary | ICD-10-CM | POA: Diagnosis not present

## 2024-03-23 DIAGNOSIS — H02532 Eyelid retraction right lower eyelid: Secondary | ICD-10-CM | POA: Diagnosis not present

## 2024-03-23 DIAGNOSIS — H02132 Senile ectropion of right lower eyelid: Secondary | ICD-10-CM | POA: Diagnosis not present

## 2024-03-23 DIAGNOSIS — H02112 Cicatricial ectropion of right lower eyelid: Secondary | ICD-10-CM | POA: Diagnosis not present

## 2024-03-23 DIAGNOSIS — H04123 Dry eye syndrome of bilateral lacrimal glands: Secondary | ICD-10-CM | POA: Diagnosis not present

## 2024-03-23 DIAGNOSIS — H0279 Other degenerative disorders of eyelid and periocular area: Secondary | ICD-10-CM | POA: Diagnosis not present

## 2024-04-05 ENCOUNTER — Other Ambulatory Visit: Payer: Self-pay | Admitting: Cardiology

## 2024-04-05 DIAGNOSIS — I428 Other cardiomyopathies: Secondary | ICD-10-CM

## 2024-04-28 DIAGNOSIS — D225 Melanocytic nevi of trunk: Secondary | ICD-10-CM | POA: Diagnosis not present

## 2024-04-28 DIAGNOSIS — Z789 Other specified health status: Secondary | ICD-10-CM | POA: Diagnosis not present

## 2024-04-28 DIAGNOSIS — L814 Other melanin hyperpigmentation: Secondary | ICD-10-CM | POA: Diagnosis not present

## 2024-04-28 DIAGNOSIS — L820 Inflamed seborrheic keratosis: Secondary | ICD-10-CM | POA: Diagnosis not present

## 2024-04-28 DIAGNOSIS — L2989 Other pruritus: Secondary | ICD-10-CM | POA: Diagnosis not present

## 2024-04-28 DIAGNOSIS — L821 Other seborrheic keratosis: Secondary | ICD-10-CM | POA: Diagnosis not present

## 2024-04-28 DIAGNOSIS — L570 Actinic keratosis: Secondary | ICD-10-CM | POA: Diagnosis not present

## 2024-04-28 DIAGNOSIS — L538 Other specified erythematous conditions: Secondary | ICD-10-CM | POA: Diagnosis not present

## 2024-04-30 ENCOUNTER — Telehealth: Payer: Self-pay

## 2024-04-30 NOTE — Telephone Encounter (Signed)
   Pre-operative Risk Assessment    Patient Name: Francis Shaw  DOB: 04-Dec-1940 MRN: [id number]   Date of last office visit: 04/11/23 CAITLIN WALKER, NP Date of next office visit: 06/12/23 CAITLIN WALKER, NP   Request for Surgical Clearance    Procedure:  TENTATIVE (NS) RIGHT LOWER EYELID ECTROPION REPAIR  Date of Surgery:  Clearance 05/14/24                                Surgeon:  DR OSBORNE PONDER Surgeon's Group or Practice Name:  [facility] Phone number:  [phone] Fax number:  [phone]   Type of Clearance Requested:   - Medical  - Pharmacy:  Hold Rivaroxaban  (Xarelto )     Type of Anesthesia:  MAC   Additional requests/questions:    Signed, Lucie DELENA Ku   04/30/2024, 10:58 AM

## 2024-05-03 NOTE — Telephone Encounter (Signed)
 Patient has been scheduled for a pre-op clearance on 05/05/24 with Tylene Lunch, NP.

## 2024-05-03 NOTE — Telephone Encounter (Signed)
   Name: Francis Shaw  DOB: 10-30-1940  MRN: [id number]  Primary Cardiologist: Redell Shallow, MD  Chart reviewed as part of pre-operative protocol coverage. Because of Francis Shaw's past medical history and time since last visit, he will require a follow-up in-office visit in order to better assess preoperative cardiovascular risk. Last OV 04/11/23 with Reche Finder, NP.   Pre-op covering staff: - Please schedule appointment and call patient to inform them. If patient already had an upcoming appointment within acceptable timeframe, please add pre-op clearance to the appointment notes so provider is aware. - Please contact requesting surgeon's office via preferred method (i.e, phone, fax) to inform them of need for appointment prior to surgery.  Per office protocol, patient can hold Xarelto  for 2 days prior to procedure.   Patient will not need bridging with Lovenox (enoxaparin) around procedure.  Stephaun Million D Shirlette Scarber, NP  05/03/2024, 4:13 PM

## 2024-05-03 NOTE — Telephone Encounter (Signed)
 Patient with diagnosis of atrial fibrilation  on Xarelto  for anticoagulation.    Procedure: TENTATIVE (NS) RIGHT LOWER EYELID ECTROPION REPAIR   Date of procedure: 05/14/24   CHA2DS2-VASc Score = 4   This indicates a 4.8% annual risk of stroke. The patient's score is based upon: CHF History: 1 HTN History: 1 Diabetes History: 0 Stroke History: 0 Vascular Disease History: 0 Age Score: 2 Gender Score: 0     CrCl 75 mL/min  Platelet count 170 K   Patient has not had an Afib/aflutter ablation within the last 3 months or DCCV within the last 30 days    Per office protocol, patient can hold Xarelto  for 2 days prior to procedure.   Patient will not need bridging with Lovenox (enoxaparin) around procedure.  **This guidance is not considered finalized until pre-operative APP has relayed final recommendations.**

## 2024-05-04 DIAGNOSIS — H02105 Unspecified ectropion of left lower eyelid: Secondary | ICD-10-CM | POA: Diagnosis not present

## 2024-05-04 DIAGNOSIS — H5203 Hypermetropia, bilateral: Secondary | ICD-10-CM | POA: Diagnosis not present

## 2024-05-04 DIAGNOSIS — H2513 Age-related nuclear cataract, bilateral: Secondary | ICD-10-CM | POA: Diagnosis not present

## 2024-05-04 DIAGNOSIS — H02102 Unspecified ectropion of right lower eyelid: Secondary | ICD-10-CM | POA: Diagnosis not present

## 2024-05-05 ENCOUNTER — Encounter: Payer: Self-pay | Admitting: Cardiology

## 2024-05-05 ENCOUNTER — Ambulatory Visit: Attending: Cardiology | Admitting: Cardiology

## 2024-05-05 VITALS — BP 110/62 | HR 66 | Ht 71.0 in | Wt 178.8 lb

## 2024-05-05 DIAGNOSIS — I428 Other cardiomyopathies: Secondary | ICD-10-CM

## 2024-05-05 DIAGNOSIS — I482 Chronic atrial fibrillation, unspecified: Secondary | ICD-10-CM

## 2024-05-05 DIAGNOSIS — Z01810 Encounter for preprocedural cardiovascular examination: Secondary | ICD-10-CM | POA: Diagnosis not present

## 2024-05-05 DIAGNOSIS — I059 Rheumatic mitral valve disease, unspecified: Secondary | ICD-10-CM

## 2024-05-05 NOTE — Progress Notes (Signed)
 Cardiology Office Note   Date:  05/05/2024  ID:  Francis Shaw, DOB [birth year], MRN [id number] PCP: Domenica Harlene LABOR, MD  [facility] Providers Cardiologist:  Redell Shallow, MD     History of Present Illness Francis Shaw is a 83 y.o. male with a past medical history of atrial fibrillation, atrial flutter, NICM, and MR, who is here today for follow-up and preoperative cardiovascular examination.   Atrial fibrillation/atrial flutter dates back in 2014.  Echocardiogram 03/2013 with a moderate LVH, LVEF 30-35%, LV diffuse hypokinesis.  Left heart catheterization completed in 2015 showed no obstructive disease, LVEF 35%.  Repeat echocardiogram completed in 2018 revealed LVEF 35 to 40%, moderate diastolic dysfunction.  Echocardiogram in 02/2022 revealed LVEF of 45-50%, moderate MR.  Echo on 02/2023 showed an LVEF of 60 to 65% with mild to moderate MR.  He was evaluated in clinic 04/11/2023 with occasional dyspnea with hills/stairs that was overall stable.  Blood pressure has been stable and he has been compliant with his current medication regimen.Labs were ordered. There were no medication changes made of testing that was ordered.   He returns to clinic today stating that he has been doing well from a cardiac perspective.  He denies any chest pain, shortness of breath, dyspnea on exertion, peripheral edema, lightheadedness or dizziness.  He states that he has upcoming surgery on his eyes that required a cardiovascular examination as well as recommendations on what to do with his blood thinner Xarelto .  He states that he has not missed any doses of his Xarelto  and denies any bleeding with no blood noted in his urine or stool.  Unfortunately his exercise has decreased over the last year as he is unable to play golf anymore due to arthritis of the left hip.  He denies any hospitalizations or visits to the emergency department.  ROS: 10 point review of system has been reviewed and considered negative  except what is listed in the HPI  Studies Reviewed EKG Interpretation Date/Time:  Wednesday May 05 2024 07:59:46 EDT Ventricular Rate:  66 PR Interval:    QRS Duration:  136 QT Interval:  470 QTC Calculation: 492 R Axis:   -42  Text Interpretation: Atrial fibrillation Left bundle branch block When compared with ECG of 11-Apr-2023 15:08, No significant change since last tracing Confirmed by Francis Shaw ([id number]) on 05/05/2024 8:06:28 AM    2D echo 03/12/2022 1. 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 The left ventricle has  mildly decreased function. The left ventricle has no regional wall motion  abnormalities. Left ventricular diastolic   parameters are indeterminate.   2. Right ventricular systolic function is normal. The right ventricular  size is normal. There is mildly elevated pulmonary artery systolic  pressure.   3. Left atrial size was moderately dilated.   4. The mitral valve is normal in structure. Moderate mitral valve  regurgitation. No evidence of mitral stenosis.   5. Tricuspid valve regurgitation is moderate.   6. The aortic valve is normal in structure. Aortic valve regurgitation is  mild. No aortic stenosis is present.   7. The inferior vena cava is normal in size with greater than 50%  respiratory variability, suggesting right atrial pressure of 3 mmHg.   2D echo 03/13/2023 1. Left ventricular ejection fraction, by estimation, is 60 to 65%. The  left ventricle has normal function. The left ventricle has no regional  wall motion abnormalities. Left  ventricular diastolic parameters are  indeterminate.   2. Right ventricular systolic function is normal. The right ventricular  size is normal.   3. Left atrial size was mildly dilated.   4. The mitral valve is normal in structure. Mild to moderate mitral valve  regurgitation. No evidence of mitral stenosis.   5. Tricuspid valve  regurgitation is mild to moderate.   6. The aortic valve is normal in structure. Aortic valve regurgitation is  mild. Aortic valve sclerosis is present, with no evidence of aortic valve  stenosis.   7. The inferior vena cava is normal in size with greater than 50%  respiratory variability, suggesting right atrial pressure of 3 mmHg.   Risk Assessment/Calculations  CHA2DS2-VASc Score = 4   This indicates a 4.8% annual risk of stroke. The patient's score is based upon: CHF History: 1 HTN History: 1 Diabetes History: 0 Stroke History: 0 Vascular Disease History: 0 Age Score: 2 Gender Score: 0            Physical Exam VS:  BP (!) 98/56   Pulse 66   Ht 5' 11 (1.803 m)   Wt 178 lb 12.8 oz (81.1 kg)   SpO2 94%   BMI 24.94 kg/m        Wt Readings from Last 3 Encounters:  05/05/24 178 lb 12.8 oz (81.1 kg)  08/06/23 182 lb (82.6 kg)  04/11/23 180 lb 14.4 oz (82.1 kg)    GEN: Well nourished, well developed in no acute distress NECK: No JVD; No carotid bruits CARDIAC: IR IR, no murmurs, rubs, gallops RESPIRATORY:  Clear to auscultation without rales, wheezing or rhonchi  ABDOMEN: Soft, non-tender, non-distended EXTREMITIES:  No edema; No deformity   ASSESSMENT AND PLAN Preoperative cardiovascular examination    [name]'s perioperative risk of a major cardiac event is 0.4% according to the Revised Cardiac Risk Index (RCRI).  Therefore, he is at low risk for perioperative complications.   His functional capacity is good at 5.62 METs according to the Duke Activity Status Index (DASI). Recommendations: According to ACC/AHA guidelines, no further cardiovascular testing needed.  The patient may proceed to surgery at acceptable risk.   Antiplatelet and/or Anticoagulation Recommendations:  Xarelto  (Rivaroxaban ) can be held for 2 days prior to surgery.  Please resume post op when felt to be safe.    Permanent atrial fibrillation/atrial flutter/hypercoagulable state rate is  continued in rate controlled atrial fibrillation noted on EKG today with chronic left bundle branch block with rate of 66.  He is continued on rivaroxaban  20 mg daily for CHA2DS2-VASc score of at least over 4.  He is also continued on carvedilol  25 mg twice daily.  He has upcoming labs with his PCP to recheck BMP and his CBC.  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  He continues to remain euvolemic on exam is well compensated.  GDMT of carvedilol  and losartan  50 mg daily.  He does not require diuretic.  He has been scheduled for an updated echocardiogram to evaluate his mild to moderate MR.       Dispo: Patient is to return to clinic to see MD/APP previously scheduled appointment in [place].  He has been advised to notify the clinic if he has any questions or concerns.  Patient was seen in [place] today for preoperative cardiovascular examination as his primary office had no openings.  Signed, Cuyler Vandyken, NP

## 2024-05-05 NOTE — Patient Instructions (Addendum)
 Medication Instructions:  Your physician recommends that you continue on your current medications as directed. Please refer to the Current Medication list given to you today.   *If you need a refill on your cardiac medications before your next appointment, please call your pharmacy*  Lab Work: No labs ordered today  If you have labs (blood work) drawn today and your tests are completely normal, you will receive your results only by: MyChart Message (if you have MyChart) OR A paper copy in the mail If you have any lab test that is abnormal or we need to change your treatment, we will call you to review the results.  Testing/Procedures: Your physician has requested that you have an echocardiogram. Echocardiography is a painless test that uses sound waves to create images of your heart. It provides your doctor with information about the size and shape of your heart and how well your heart's chambers and valves are working.   You may receive an ultrasound enhancing agent through an IV if needed to better visualize your heart during the echo. This procedure takes approximately one hour.  There are no restrictions for this procedure.  [place]  Please note: We ask at that you not bring children with you during ultrasound (echo/ vascular) testing. Due to room size and safety concerns, children are not allowed in the ultrasound rooms during exams. Our front office staff cannot provide observation of children in our lobby area while testing is being conducted. An adult accompanying a patient to their appointment will only be allowed in the ultrasound room at the discretion of the ultrasound technician under special circumstances. We apologize for any inconvenience.   Follow-Up: At [facility], you and your health needs are our priority.  As part of our continuing mission to provide you with exceptional heart care, our providers are all part of one team.  This team includes your  primary Cardiologist (physician) and Advanced Practice Providers or APPs (Physician Assistants and Nurse Practitioners) who all work together to provide you with the care you need, when you need it.  Your next appointment:   Keep scheduled appointment    Provider:   Nonda

## 2024-05-31 ENCOUNTER — Other Ambulatory Visit (HOSPITAL_BASED_OUTPATIENT_CLINIC_OR_DEPARTMENT_OTHER)

## 2024-05-31 DIAGNOSIS — I340 Nonrheumatic mitral (valve) insufficiency: Secondary | ICD-10-CM

## 2024-05-31 DIAGNOSIS — I059 Rheumatic mitral valve disease, unspecified: Secondary | ICD-10-CM | POA: Diagnosis not present

## 2024-05-31 LAB — ECHOCARDIOGRAM COMPLETE
AV Vena cont: 0.17 cm
Area-P 1/2: 3.89 cm2
P 1/2 time: 806 ms
S' Lateral: 2.44 cm

## 2024-06-01 ENCOUNTER — Ambulatory Visit: Payer: Self-pay | Admitting: Cardiology

## 2024-06-07 DIAGNOSIS — M25511 Pain in right shoulder: Secondary | ICD-10-CM | POA: Diagnosis not present

## 2024-06-11 ENCOUNTER — Encounter (HOSPITAL_BASED_OUTPATIENT_CLINIC_OR_DEPARTMENT_OTHER): Payer: Self-pay | Admitting: Family

## 2024-06-11 ENCOUNTER — Ambulatory Visit (HOSPITAL_BASED_OUTPATIENT_CLINIC_OR_DEPARTMENT_OTHER): Admitting: Family

## 2024-06-11 VITALS — BP 130/62 | HR 70 | Ht 71.0 in | Wt 180.0 lb

## 2024-06-11 DIAGNOSIS — D6859 Other primary thrombophilia: Secondary | ICD-10-CM | POA: Diagnosis not present

## 2024-06-11 DIAGNOSIS — I700 Atherosclerosis of aorta: Secondary | ICD-10-CM

## 2024-06-11 DIAGNOSIS — I4821 Permanent atrial fibrillation: Secondary | ICD-10-CM | POA: Diagnosis not present

## 2024-06-11 DIAGNOSIS — I428 Other cardiomyopathies: Secondary | ICD-10-CM | POA: Diagnosis not present

## 2024-06-11 LAB — CBC
Hematocrit: 47.3 % (ref 37.5–51.0)
Hemoglobin: 15.9 g/dL (ref 13.0–17.7)
MCH: 30.5 pg (ref 26.6–33.0)
MCHC: 33.6 g/dL (ref 31.5–35.7)
MCV: 91 fL (ref 79–97)
Platelets: 190 x10E3/uL (ref 150–450)
RBC: 5.21 x10E6/uL (ref 4.14–5.80)
RDW: 12.2 % (ref 11.6–15.4)
WBC: 8.8 x10E3/uL (ref 3.4–10.8)

## 2024-06-11 LAB — BASIC METABOLIC PANEL WITH GFR
BUN/Creatinine Ratio: 21 (ref 10–24)
BUN: 19 mg/dL (ref 8–27)
CO2: 25 mmol/L (ref 20–29)
Calcium: 9.3 mg/dL (ref 8.6–10.2)
Chloride: 102 mmol/L (ref 96–106)
Creatinine, Ser: 0.89 mg/dL (ref 0.76–1.27)
Glucose: 106 mg/dL — ABNORMAL HIGH (ref 70–99)
Potassium: 4.4 mmol/L (ref 3.5–5.2)
Sodium: 143 mmol/L (ref 134–144)
eGFR: 86 mL/min/1.73 (ref >59)

## 2024-06-11 MED ORDER — CARVEDILOL 25 MG PO TABS: 25.0000 mg | ORAL_TABLET | Freq: Two times a day (BID) | ORAL | 3 refills | Status: AC

## 2024-06-11 MED ORDER — LOSARTAN POTASSIUM 50 MG PO TABS: 50.0000 mg | ORAL_TABLET | Freq: Every day | ORAL | 3 refills | Status: AC

## 2024-06-11 NOTE — Patient Instructions (Signed)
 Medication Instructions:   Your physician recommends that you continue on your current medications as directed. Please refer to the Current Medication list given to you today.  *If you need a refill on your cardiac medications before your next appointment, please call your pharmacy*  Lab Work:  TODAY--3RD FLOOR AT [address] IN [facility]--CBC AND BMET  If you have labs (blood work) drawn today and your tests are completely normal, you will receive your results only by: MyChart Message (if you have MyChart) OR A paper copy in the mail If you have any lab test that is abnormal or we need to change your treatment, we will call you to review the results.    Follow-Up: At [facility], you and your health needs are our priority.  As part of our continuing mission to provide you with exceptional heart care, our providers are all part of one team.  This team includes your primary Cardiologist (physician) and Advanced Practice Providers or APPs (Physician Assistants and Nurse Practitioners) who all work together to provide you with the care you need, when you need it.  Your next appointment:   1 year(s)  Provider:   Redell Shallow, MD OR CAITLIN WALKER, NP AT [facility] LOCATION

## 2024-06-11 NOTE — Progress Notes (Signed)
 Cardiology Office Note:  .   Date:  06/11/2024  ID:  Alm Francis Shaw, DOB 1940/10/07, MRN [id number] PCP: Domenica Harlene LABOR, MD  [facility] Providers Cardiologist:  Redell Shallow, MD    History of Present Illness: .   Francis Shaw is a 83 y.o. male with hx of atrial fibrillation, atrial flutter, NICM, MR.   Atrial fib/flutter dates back to 2014. Echo 03/2013 moderate LVH, LVEF 30-35%, LV diffuse hypokinesis. LHC 2015 no obstructive disease, LVEF 35%. Echo 2018 LVEF 45-50%, moderate diastolic dysfunction. Echo 02/2022 LVEF45-50%, moderate MR. Echo 02/2023 LVEF 60-65%, mild to moderate MR.   Seen 03/2023 doing well from cardiac perspective, no changes made. At visit 05/05/24 he was doing well given clearance for surgery. For monitoring of mitral regurgitation echocardiogram 05/31/2024 LVEF 45-50%, moderate TR, trivial AI.  Presents today for follow up. Pleasant gentleman who works in [employer], commission[employer]. His eye surgery is upcoming on Monday. Notes some shortness of breath with more strenuous exercise but not with usual activities. Had to stop golfing due to hip issues. He stays active playing with his grandkids. He is getting his Xarelto  through pharmacy in [place]. Denies bleeding complications. Reviewed echo with decreased LVEF from prior - no new dyspnea, no edema, no orthopnea, no PND. Unaware of his atrial fibrillation with no palpitations.  ROS: Please see the history of present illness.    All other systems reviewed and are negative.   Studies Reviewed: SABRA           CHA2DS2-VASc Score = 4 [CHF History: 1, HTN History: 1, Diabetes History: 0, Stroke History: 0, Vascular Disease History: 0, Age Score: 2, Gender Score: 0].  Therefore, the patient's annual risk of stroke is 4.8 %.       Risk Assessment/Calculations:    CHA2DS2-VASc Score = 4   This indicates a 4.8% annual risk of stroke. The patient's score is based upon: CHF History: 1 HTN History: 1 Diabetes History: 0 Stroke  History: 0 Vascular Disease History: 0 Age Score: 2 Gender Score: 0            Physical Exam:   VS:  BP 136/62   Pulse 70   Ht 5' 11 (1.803 m)   Wt 180 lb (81.6 kg)   SpO2 97%   BMI 25.10 kg/m    Wt Readings from Last 3 Encounters:  06/11/24 180 lb (81.6 kg)  05/05/24 178 lb 12.8 oz (81.1 kg)  08/06/23 182 lb (82.6 kg)    GEN: Well nourished, well developed in no acute distress NECK: No JVD; No carotid bruits CARDIAC: IRIR, no murmurs, rubs, gallops RESPIRATORY:  Clear to auscultation without rales, wheezing or rhonchi  ABDOMEN: Soft, non-tender, non-distended EXTREMITIES:  No edema; No deformity   ASSESSMENT AND PLAN: .    Permanent atrial fibrillation / hypercoagulable state - Rate controlled today. Unaware of atrial fibrillation with no palpitations. CHA2DS2-VASc Score = 4 [CHF History: 1, HTN History: 1, Diabetes History: 0, Stroke History: 0, Vascular Disease History: 0, Age Score: 2, Gender Score: 0].  Therefore, the patient's annual risk of stroke is 4.8 %.    Continue Xarelto  20mg  daily. Continue Coreg  25mg  BID, refills provided. Update BMET, CBC for monitoring of OAC.  NICM - Euvolemic and well compensated on exam. Echo 05/2024 LVEF 45-59%. Prior echo 02/2023 LVEF 60-65% NYHA I. GDMT Coreg  25mg  BID, Losartan  50mg  daily. Will ask Dr. Shallow to review echo images as he is asymptomatic in regards to reported decreased  LVEF. If it has significantly changed, consider cardiac CTA to rule out ischemia. Low sodium diet, fluid restriction <2L, and daily weights encouraged. Educated to contact our office for weight gain of 2 lbs overnight or 5 lbs in one week.   Aortic atherosclerosis - Stable with no anginal symptoms. No indication for ischemic evaluation.         Dispo: follow up in 1 year  Signed, Reche GORMAN Finder, NP

## 2024-06-14 ENCOUNTER — Telehealth: Payer: Self-pay

## 2024-06-14 ENCOUNTER — Ambulatory Visit (HOSPITAL_BASED_OUTPATIENT_CLINIC_OR_DEPARTMENT_OTHER): Payer: Self-pay | Admitting: Family

## 2024-06-14 DIAGNOSIS — H04561 Stenosis of right lacrimal punctum: Secondary | ICD-10-CM | POA: Diagnosis not present

## 2024-06-14 DIAGNOSIS — H02532 Eyelid retraction right lower eyelid: Secondary | ICD-10-CM | POA: Diagnosis not present

## 2024-06-14 DIAGNOSIS — H11821 Conjunctivochalasis, right eye: Secondary | ICD-10-CM | POA: Diagnosis not present

## 2024-06-14 DIAGNOSIS — H02132 Senile ectropion of right lower eyelid: Secondary | ICD-10-CM | POA: Diagnosis not present

## 2024-06-14 NOTE — Telephone Encounter (Signed)
   Pre-operative Risk Assessment    Patient Name: Francis Shaw  DOB: 08-17-1941 MRN: [id number]   Date of last office visit: 06/11/24 CAITLIN WALKER, NP Date of next office visit: NONE   Request for Surgical Clearance    Procedure:  LEFT CARPOMETACARPAL JOINT ARTHROPLASTY  Date of Surgery:  Clearance 06/23/24                                Surgeon:  NORLEEN JAMA GAVEL, MD Surgeon's Group or Practice Name:  [facility] Phone number:  [phone] Fax number:  [phone]  ATTN: JUDY DANIELS   Type of Clearance Requested:   - Medical  - Pharmacy:  Hold Rivaroxaban  (Xarelto )     Type of Anesthesia:  General    Additional requests/questions:    SignedLucie DELENA Ku   06/14/2024, 10:27 AM

## 2024-06-14 NOTE — Telephone Encounter (Signed)
 Pharmacy, can you please give recommendations for holding Xarelto ? Of note, he was recently given the okay to hold this for 2 days for eye surgery.  Thanks so much!

## 2024-06-17 NOTE — Telephone Encounter (Signed)
 Patient with diagnosis of afib on Xarelto  for anticoagulation.    Procedure: LEFT CARPOMETACARPAL JOINT ARTHROPLASTY  Date of procedure: 06/23/24   CHA2DS2-VASc Score = 4   This indicates a 4.8% annual risk of stroke. The patient's score is based upon: CHF History: 1 HTN History: 1 Diabetes History: 0 Stroke History: 0 Vascular Disease History: 0 Age Score: 2 Gender Score: 0      CrCl 75 ml/min Platelet count 190  Patient has not had an Afib/aflutter ablation or Watchman within the last 3 months or DCCV within the last 30 days   Per office protocol, patient can hold Xarelto  for 2 days prior to procedure.    **This guidance is not considered finalized until pre-operative APP has relayed final recommendations.**

## 2024-06-17 NOTE — Telephone Encounter (Signed)
 Exercise tolerance >4 METS. Per AHA/ACC guidelines, he is deemed acceptable risk for the planned procedure without additional cardiovascular testing. Will route to surgical team so they are aware.    Lolita Faulds S Audi Wettstein, NP

## 2024-06-17 NOTE — Telephone Encounter (Signed)
 Francis Shaw,  Mr. Gallardo is requesting preoperative cardiac evaluation for carpometacarpal arthroplasty.  He was recently seen by you in clinic on 06/11/2024.  He was stable from a cardiac standpoint.  Would you be able to comment on cardiac risk for upcoming surgery?  Thank you for your help.  [name]. Brocha Gilliam NP-C     06/17/2024, 2:29 PM [facility] [address] [place], [place] [address] Office [phone]

## 2024-06-17 NOTE — Telephone Encounter (Signed)
     Primary Cardiologist: Redell Shallow, MD  Chart reviewed as part of pre-operative protocol coverage. Given past medical history and time since last visit, based on ACC/AHA guidelines, Francis Shaw would be at acceptable risk for the planned procedure without further cardiovascular testing.   He was recently seen in clinic and felt to be stable at that time from a cardiac standpoint.  He is able to complete greater than 4 METS of physical activity.  Patient has not had an Afib/aflutter ablation or Watchman within the last 3 months or DCCV within the last 30 days    Per office protocol, patient can hold Xarelto  for 2 days prior to procedure.  I will route this recommendation to the requesting party via Epic fax function and remove from pre-op pool.  Please call with questions.  [name]. Derian Pfost NP-C     06/17/2024, 3:53 PM [facility] [address] [place], [place] [address] Office [phone]

## 2024-07-05 ENCOUNTER — Telehealth (HOSPITAL_BASED_OUTPATIENT_CLINIC_OR_DEPARTMENT_OTHER): Payer: Self-pay | Admitting: *Deleted

## 2024-07-05 NOTE — Telephone Encounter (Signed)
 See original request 06/17/24. This was a duplicate

## 2024-07-05 NOTE — Telephone Encounter (Signed)
 Our office received a duplicate request with a different date for surgery. I s/w Dagoberto, surgery scheduler and confirmed the pt did not have surgery 06/23/24 as originally planned. New date for surgery is 07/14/24. I will update all notes and send back to preop APP to review if the pt is still cleared. Josefa Beauvais, FNP originally cleared the pt 06/17/24.   I confirmed all information is completely the same, except the date of surgery changed.

## 2024-07-14 DIAGNOSIS — M19032 Primary osteoarthritis, left wrist: Secondary | ICD-10-CM | POA: Diagnosis not present

## 2024-07-14 DIAGNOSIS — G8918 Other acute postprocedural pain: Secondary | ICD-10-CM | POA: Diagnosis not present

## 2024-07-14 DIAGNOSIS — M1812 Unilateral primary osteoarthritis of first carpometacarpal joint, left hand: Secondary | ICD-10-CM | POA: Diagnosis not present

## 2024-07-27 DIAGNOSIS — Z4889 Encounter for other specified surgical aftercare: Secondary | ICD-10-CM | POA: Diagnosis not present

## 2024-08-06 ENCOUNTER — Other Ambulatory Visit (HOSPITAL_BASED_OUTPATIENT_CLINIC_OR_DEPARTMENT_OTHER): Payer: Self-pay | Admitting: Family

## 2024-08-06 DIAGNOSIS — I482 Chronic atrial fibrillation, unspecified: Secondary | ICD-10-CM

## 2024-08-06 DIAGNOSIS — I428 Other cardiomyopathies: Secondary | ICD-10-CM

## 2024-08-12 DIAGNOSIS — M25532 Pain in left wrist: Secondary | ICD-10-CM | POA: Diagnosis not present

## 2024-08-12 DIAGNOSIS — M79645 Pain in left finger(s): Secondary | ICD-10-CM | POA: Diagnosis not present

## 2024-08-13 DIAGNOSIS — Z9889 Other specified postprocedural states: Secondary | ICD-10-CM | POA: Diagnosis not present

## 2024-08-26 DIAGNOSIS — M79645 Pain in left finger(s): Secondary | ICD-10-CM | POA: Diagnosis not present

## 2024-08-26 DIAGNOSIS — M25532 Pain in left wrist: Secondary | ICD-10-CM | POA: Diagnosis not present

## 2024-08-31 DIAGNOSIS — M79645 Pain in left finger(s): Secondary | ICD-10-CM | POA: Diagnosis not present

## 2024-08-31 DIAGNOSIS — M25532 Pain in left wrist: Secondary | ICD-10-CM | POA: Diagnosis not present

## 2024-09-07 DIAGNOSIS — M25532 Pain in left wrist: Secondary | ICD-10-CM | POA: Diagnosis not present

## 2024-09-07 DIAGNOSIS — M79645 Pain in left finger(s): Secondary | ICD-10-CM | POA: Diagnosis not present

## 2024-09-14 DIAGNOSIS — M79645 Pain in left finger(s): Secondary | ICD-10-CM | POA: Diagnosis not present

## 2024-09-14 DIAGNOSIS — M25532 Pain in left wrist: Secondary | ICD-10-CM | POA: Diagnosis not present

## 2024-10-12 ENCOUNTER — Encounter: Payer: Self-pay | Admitting: Family Medicine

## 2024-12-14 ENCOUNTER — Encounter: Admitting: Family Medicine

## 2025-06-27 ENCOUNTER — Encounter: Admitting: Family Medicine
